# Patient Record
Sex: Female | Born: 1970 | Race: White | Hispanic: No | Marital: Married | Smoking: Never smoker
Health system: Southern US, Community
[De-identification: ages and names within clinical notes are randomized; demographics above are authoritative.]

## PROBLEM LIST (undated history)

## (undated) DIAGNOSIS — R112 Nausea with vomiting, unspecified: Secondary | ICD-10-CM

## (undated) DIAGNOSIS — E119 Type 2 diabetes mellitus without complications: Secondary | ICD-10-CM

## (undated) DIAGNOSIS — D500 Iron deficiency anemia secondary to blood loss (chronic): Secondary | ICD-10-CM

## (undated) DIAGNOSIS — D72829 Elevated white blood cell count, unspecified: Secondary | ICD-10-CM

## (undated) DIAGNOSIS — E282 Polycystic ovarian syndrome: Secondary | ICD-10-CM

## (undated) DIAGNOSIS — Z8619 Personal history of other infectious and parasitic diseases: Secondary | ICD-10-CM

## (undated) DIAGNOSIS — Z9889 Other specified postprocedural states: Secondary | ICD-10-CM

## (undated) DIAGNOSIS — K219 Gastro-esophageal reflux disease without esophagitis: Secondary | ICD-10-CM

## (undated) DIAGNOSIS — I10 Essential (primary) hypertension: Secondary | ICD-10-CM

## (undated) DIAGNOSIS — R51 Headache: Secondary | ICD-10-CM

## (undated) DIAGNOSIS — K635 Polyp of colon: Secondary | ICD-10-CM

## (undated) DIAGNOSIS — E782 Mixed hyperlipidemia: Secondary | ICD-10-CM

## (undated) DIAGNOSIS — D72828 Other elevated white blood cell count: Secondary | ICD-10-CM

## (undated) DIAGNOSIS — M542 Cervicalgia: Secondary | ICD-10-CM

## (undated) DIAGNOSIS — E039 Hypothyroidism, unspecified: Secondary | ICD-10-CM

## (undated) DIAGNOSIS — D122 Benign neoplasm of ascending colon: Secondary | ICD-10-CM

## (undated) DIAGNOSIS — Z1231 Encounter for screening mammogram for malignant neoplasm of breast: Secondary | ICD-10-CM

## (undated) DIAGNOSIS — K529 Noninfective gastroenteritis and colitis, unspecified: Secondary | ICD-10-CM

## (undated) DIAGNOSIS — E1165 Type 2 diabetes mellitus with hyperglycemia: Principal | ICD-10-CM

## (undated) DIAGNOSIS — R221 Localized swelling, mass and lump, neck: Secondary | ICD-10-CM

## (undated) DIAGNOSIS — G5601 Carpal tunnel syndrome, right upper limb: Secondary | ICD-10-CM

## (undated) DIAGNOSIS — T3695XA Adverse effect of unspecified systemic antibiotic, initial encounter: Secondary | ICD-10-CM

## (undated) DIAGNOSIS — R928 Other abnormal and inconclusive findings on diagnostic imaging of breast: Secondary | ICD-10-CM

## (undated) DIAGNOSIS — N63 Unspecified lump in unspecified breast: Secondary | ICD-10-CM

## (undated) DIAGNOSIS — G5603 Carpal tunnel syndrome, bilateral upper limbs: Secondary | ICD-10-CM

## (undated) DIAGNOSIS — N6311 Unspecified lump in the right breast, upper outer quadrant: Secondary | ICD-10-CM

## (undated) DIAGNOSIS — B379 Candidiasis, unspecified: Principal | ICD-10-CM

## (undated) DIAGNOSIS — R0683 Snoring: Secondary | ICD-10-CM

## (undated) DIAGNOSIS — N6331 Unspecified lump in axillary tail of the right breast: Secondary | ICD-10-CM

## (undated) DIAGNOSIS — R2231 Localized swelling, mass and lump, right upper limb: Secondary | ICD-10-CM

## (undated) DIAGNOSIS — M25512 Pain in left shoulder: Secondary | ICD-10-CM

## (undated) DIAGNOSIS — L988 Other specified disorders of the skin and subcutaneous tissue: Secondary | ICD-10-CM

## (undated) HISTORY — PX: BREAST LUMPECTOMY: SHX2

## (undated) HISTORY — DX: Polycystic ovarian syndrome: E28.2

## (undated) HISTORY — PX: TONSILLECTOMY AND ADENOIDECTOMY: SUR1326

## (undated) HISTORY — DX: Type 2 diabetes mellitus without complications: E11.9

## (undated) HISTORY — PX: HERNIA REPAIR: SHX51

## (undated) HISTORY — DX: Polyp of colon: K63.5

## (undated) HISTORY — DX: Essential (primary) hypertension: I10

## (undated) HISTORY — PX: WISDOM TOOTH EXTRACTION: SHX21

## (undated) HISTORY — PX: FINGER SURGERY: SHX640

## (undated) HISTORY — DX: Personal history of other infectious and parasitic diseases: Z86.19

## (undated) HISTORY — PX: DILATION AND CURETTAGE OF UTERUS: SHX78

## (undated) HISTORY — DX: Elevated white blood cell count, unspecified: D72.829

---

## 2007-12-04 HISTORY — PX: EXTERNAL CEPHALIC VERSION: SUR1454

## 2011-04-25 HISTORY — PX: FOOT SURGERY: SHX648

## 2012-04-24 DIAGNOSIS — K635 Polyp of colon: Secondary | ICD-10-CM

## 2012-04-24 HISTORY — DX: Polyp of colon: K63.5

## 2013-02-03 ENCOUNTER — Ambulatory Visit (INDEPENDENT_AMBULATORY_CARE_PROVIDER_SITE_OTHER): Payer: 59 | Admitting: Gynecology

## 2013-02-03 ENCOUNTER — Encounter: Payer: Self-pay | Admitting: Gynecology

## 2013-02-03 VITALS — BP 134/88 | HR 90 | Resp 18 | Ht 64.0 in | Wt 220.0 lb

## 2013-02-03 DIAGNOSIS — R102 Pelvic and perineal pain unspecified side: Secondary | ICD-10-CM

## 2013-02-03 DIAGNOSIS — Z0000 Encounter for general adult medical examination without abnormal findings: Secondary | ICD-10-CM

## 2013-02-03 DIAGNOSIS — N949 Unspecified condition associated with female genital organs and menstrual cycle: Secondary | ICD-10-CM

## 2013-02-03 DIAGNOSIS — N926 Irregular menstruation, unspecified: Secondary | ICD-10-CM

## 2013-02-03 DIAGNOSIS — N760 Acute vaginitis: Secondary | ICD-10-CM

## 2013-02-03 DIAGNOSIS — Z800 Family history of malignant neoplasm of digestive organs: Secondary | ICD-10-CM

## 2013-02-03 DIAGNOSIS — E282 Polycystic ovarian syndrome: Secondary | ICD-10-CM | POA: Insufficient documentation

## 2013-02-03 DIAGNOSIS — Z01419 Encounter for gynecological examination (general) (routine) without abnormal findings: Secondary | ICD-10-CM

## 2013-02-03 DIAGNOSIS — Z8739 Personal history of other diseases of the musculoskeletal system and connective tissue: Secondary | ICD-10-CM

## 2013-02-03 LAB — POCT URINALYSIS DIPSTICK
Urobilinogen, UA: NEGATIVE
pH, UA: 5

## 2013-02-03 MED ORDER — FLUCONAZOLE 150 MG PO TABS: 150.0000 mg | ORAL_TABLET | Freq: Once | ORAL | Status: DC

## 2013-02-03 NOTE — Progress Notes (Addendum)
42 y.o. Married Caucasian female   [phone] here for annual exam. Pt is currently sexually active. Pt reports lifefong heavy menses, she will use super+ tampon with pad every 1.5h with clotting, but most recently she will have light spotting before she flows.  Flows heavy 3-4d, some cramping.  Pt had never been regular, and was diagnosed with PCOS  6y ago by history. Pt can skip 1-76m.   Pt reports right lower quadrant pain intermittent for 1y  Was seen in  ER in [place] had CT and PUS both were normal.  Evaluation included GI, normal.  Pt was borderline gestational diabetic and mild pre-eclamptic.  Pt had been started on metformin 6y ago but took very sporadically, has moved frequently so no consistent care.    Patient's last menstrual period was 01/09/2013.          Sexually active: yes  The current method of family planning is the withdrawal method.    Exercising: no  The patient does not participate in regular exercise at present. Last pap: 1 year and 6 months; Normal Alcohol: no Tobacco: no BSE:  Sometimes Mammogram: years ago  Colonoscopy: 3y ago  Hgb: PCP ; Urine: Leuks 2  No health maintenance topics applied.  Family History  Problem Relation Age of Onset  . Colon cancer Mother   . Diabetes Mother   . Lymphoma Paternal Grandmother   . Breast cancer Cousin     Patient Active Problem List   Diagnosis Date Noted  . PCOS (polycystic ovarian syndrome)     Past Medical History  Diagnosis Date  . PCOS (polycystic ovarian syndrome)   . Endometriosis   . Hypertension     Past Surgical History  Procedure Laterality Date  . Dilation and curettage of uterus      x2   . Breast lumpectomy Right 20 years ago    Benign Enlarged Lymph Node  . Tonsillectomy and adenoidectomy  32 year ago  . Finger surgery  25 years ago    Allergies: Review of patient's allergies indicates no known allergies.  Current Outpatient Prescriptions  Medication Sig Dispense Refill  . losartan (COZAAR) 50  MG tablet       . metFORMIN (GLUCOPHAGE) 500 MG tablet Take 500 mg by mouth 2 (two) times daily with a meal.       No current facility-administered medications for this visit.    ROS: Pertinent items are noted in HPI.  Exam:    BP 134/88  Pulse 90  Resp 18  Ht 5\' 4"  (1.626 m)  Wt 220 lb (99.791 kg)  BMI 37.74 kg/m2  LMP 01/09/2013 Weight change: @WEIGHTCHANGE @ Last 3 height recordings:  Ht Readings from Last 3 Encounters:  02/03/13 5\' 4"  (1.626 m)   General appearance: alert, cooperative and appears stated age Head: Normocephalic, without obvious abnormality, atraumatic Neck: no adenopathy, no carotid bruit, no JVD, supple, symmetrical, trachea midline and thyroid not enlarged, symmetric, no tenderness/mass/nodules Lungs: clear to auscultation bilaterally Breasts: normal appearance, no masses or tenderness Heart: regular rate and rhythm, S1, S2 normal, no murmur, click, rub or gallop Abdomen: soft, non-tender; bowel sounds normal; no masses,  no organomegaly Extremities: extremities normal, atraumatic, no cyanosis or edema Skin: Skin color, texture, turgor normal. No rashes or lesions Lymph nodes: Cervical, supraclavicular, and axillary nodes normal. no inguinal nodes palpated Neurologic: Grossly normal Spine:  Pt examine upright, curvature noted on left thoracic and right lower back   Pelvic: External genitalia:  no lesions  Urethra: normal appearing urethra with no masses, tenderness or lesions              Bartholins and Skenes: normal                 Vagina: normal appearing vagina with normal color and discharge, no lesions, vaginal discharge - white, curd-like, green and thick              Cervix: normal appearance              Pap taken: yes        Bimanual Exam:  Uterus:  enlarged to 8 week's size, no distinct fibroid                                      Adnexa:    no masses                                      Rectovaginal: tenderness over right levator  muscles, no uterosacral nodularity                                      Anus:  normal sphincter tone, no lesions  A: well woman Contraceptive management Enlarged uterus Irregular menses Pelvic pain Family history of colon cancer     P: mammogram annually recommended pap smear with HRHPV, guidelines reviewed Will get records from hospital in [place] regarding u/s, ct-pt to call before f/u to assure arrival Suspect PCOS-discussed importance of taking metformin, discussed impact of elevated LH on CVD and atherosclerosis.  Pt with macrosomia and central obesity in addition, irregular menses, risks of uterine cancer with unopposed estrogen discussed.  Suggest she consider porgestin IUD to protect against uterine cancer and may achieve ovulation with compliance with metformin. Pt agrees to return for fasting labs on day 3-FSH/LH, GLUC, INSULIN.  Will rto in 4w Pain related to spinal abnormalities? Refer to ortho, may need PT Refer for colon cancer screening Gross yeast-fluconazole return annually or prn   An After Visit Summary was printed and given to the patient.   Records reviewed:   08/06/12:   CT:  No acute pathology in abdomen or pelvis, hepatic fatty liver infiltration. Right renal cyst-small hypodense lesions, no measurements given U/S:  Left ovary not viz, right normal, normal uterus  9.8x7.4x5.8cm, ems 16mm T:  29, freeT: 1.1, DHEAS: 180.7, TSH 2.8, random glucose 109 PAP: 2/13 NEG HRHPV, NL

## 2013-02-03 NOTE — Patient Instructions (Addendum)
Return for fasting labs around day 3 of upcoming cycle, first day of flow=1 Call 1w before f/u to check on records F/u with GI and ortho EXERCISE AND DIET:  We recommended that you start or continue a regular exercise program for good health. Regular exercise means any activity that makes your heart beat faster and makes you sweat.  We recommend exercising at least 30 minutes per day at least 3 days a week, preferably 4 or 5.  We also recommend a diet low in fat and sugar.  Inactivity, poor dietary choices and obesity can cause diabetes, heart attack, stroke, and kidney damage, among others.    ALCOHOL AND SMOKING:  Women should limit their alcohol intake to no more than 7 drinks/beers/glasses of wine (combined, not each!) per week. Moderation of alcohol intake to this level decreases your risk of breast cancer and liver damage. And of course, no recreational drugs are part of a healthy lifestyle.  And absolutely no smoking or even second hand smoke. Most people know smoking can cause heart and lung diseases, but did you know it also contributes to weakening of your bones? Aging of your skin?  Yellowing of your teeth and nails?  CALCIUM AND VITAMIN D:  Adequate intake of calcium and Vitamin D are recommended.  The recommendations for exact amounts of these supplements seem to change often, but generally speaking 600 mg of calcium (either carbonate or citrate) and 800 units of Vitamin D per day seems prudent. Certain women may benefit from higher intake of Vitamin D.  If you are among these women, your doctor will have told you during your visit.    PAP SMEARS:  Pap smears, to check for cervical cancer or precancers,  have traditionally been done yearly, although recent scientific advances have shown that most women can have pap smears less often.  However, every woman still should have a physical exam from her gynecologist every year. It will include a breast check, inspection of the vulva and vagina to  check for abnormal growths or skin changes, a visual exam of the cervix, and then an exam to evaluate the size and shape of the uterus and ovaries.  And after 42 years of age, a rectal exam is indicated to check for rectal cancers. We will also provide age appropriate advice regarding health maintenance, like when you should have certain vaccines, screening for sexually transmitted diseases, bone density testing, colonoscopy, mammograms, etc.   MAMMOGRAMS:  All women over 42 years old should have a yearly mammogram. Many facilities now offer a "3D" mammogram, which may cost around $50 extra out of pocket. If possible,  we recommend you accept the option to have the 3D mammogram performed.  It both reduces the number of women who will be called back for extra views which then turn out to be normal, and it is better than the routine mammogram at detecting truly abnormal areas.    COLONOSCOPY:  Colonoscopy to screen for colon cancer is recommended for all women at age 25.  We know, you hate the idea of the prep.  We agree, BUT, having colon cancer and not knowing it is worse!!  Colon cancer so often starts as a polyp that can be seen and removed at colonscopy, which can quite literally save your life!  And if your first colonoscopy is normal and you have no family history of colon cancer, most women don't have to have it again for 10 years.  Once every ten years,  you can do something that may end up saving your life, right?  We will be happy to help you get it scheduled when you are ready.  Be sure to check your insurance coverage so you understand how much it will cost.  It may be covered as a preventative service at no cost, but you should check your particular policy.

## 2013-02-04 LAB — HEMOGLOBIN, FINGERSTICK: Hemoglobin, fingerstick: 12.2 g/dL (ref 12.0–16.0)

## 2013-02-13 ENCOUNTER — Telehealth: Payer: Self-pay | Admitting: Gynecology

## 2013-02-13 NOTE — Telephone Encounter (Signed)
Pt states she needs to schedule an appointment for insulin check

## 2013-02-13 NOTE — Telephone Encounter (Signed)
Patient calling to schedule labwork.  Menstrual flow started yesterday.  Fasting lab appointment  tomm at 8:30 am.  Routed to provider for review, patient agreeable with disposition.  Encounter closed.

## 2013-02-14 ENCOUNTER — Other Ambulatory Visit (INDEPENDENT_AMBULATORY_CARE_PROVIDER_SITE_OTHER): Payer: 59

## 2013-02-14 DIAGNOSIS — N926 Irregular menstruation, unspecified: Secondary | ICD-10-CM

## 2013-02-15 LAB — GLUCOSE, RANDOM: Glucose, Bld: 111 mg/dL — ABNORMAL HIGH (ref 70–99)

## 2013-02-15 LAB — INSULIN, FASTING: Insulin fasting, serum: 49 u[IU]/mL — ABNORMAL HIGH (ref 3–28)

## 2013-02-15 LAB — FSH/LH
FSH: 3.9 m[IU]/mL
LH: 4.1 m[IU]/mL

## 2013-02-27 ENCOUNTER — Other Ambulatory Visit: Payer: Self-pay

## 2013-03-14 ENCOUNTER — Encounter: Payer: Self-pay | Admitting: Gynecology

## 2013-03-14 ENCOUNTER — Ambulatory Visit (INDEPENDENT_AMBULATORY_CARE_PROVIDER_SITE_OTHER): Payer: 59 | Admitting: Gynecology

## 2013-03-14 VITALS — BP 130/78 | HR 74 | Resp 16 | Ht 64.0 in | Wt 219.0 lb

## 2013-03-14 DIAGNOSIS — N920 Excessive and frequent menstruation with regular cycle: Secondary | ICD-10-CM

## 2013-03-14 DIAGNOSIS — Z309 Encounter for contraceptive management, unspecified: Secondary | ICD-10-CM

## 2013-03-14 DIAGNOSIS — I10 Essential (primary) hypertension: Secondary | ICD-10-CM | POA: Insufficient documentation

## 2013-03-14 DIAGNOSIS — Z3009 Encounter for other general counseling and advice on contraception: Secondary | ICD-10-CM

## 2013-03-14 DIAGNOSIS — E119 Type 2 diabetes mellitus without complications: Secondary | ICD-10-CM | POA: Insufficient documentation

## 2013-03-14 DIAGNOSIS — IMO0001 Reserved for inherently not codable concepts without codable children: Secondary | ICD-10-CM

## 2013-03-14 DIAGNOSIS — R7309 Other abnormal glucose: Secondary | ICD-10-CM

## 2013-03-14 DIAGNOSIS — R7303 Prediabetes: Secondary | ICD-10-CM

## 2013-03-14 MED ORDER — METFORMIN HCL 500 MG PO TABS: 500.0000 mg | ORAL_TABLET | Freq: Every day | ORAL | Status: DC

## 2013-03-14 MED ORDER — LEVONORGESTREL 20 MCG/24HR IU IUD: 1.0000 | INTRAUTERINE_SYSTEM | Freq: Once | INTRAUTERINE | Status: DC

## 2013-03-14 MED ORDER — METFORMIN HCL 500 MG PO TABS: 500.0000 mg | ORAL_TABLET | Freq: Two times a day (BID) | ORAL | Status: DC

## 2013-03-14 NOTE — Patient Instructions (Signed)
Condoms for contraception until IUD inserted Avoid simple sugars on metformin

## 2013-03-14 NOTE — Progress Notes (Signed)
Pt here for consultation of her questionable diagnosis of PCOS, right lower quardant pain and menorrhagia. We reviewed her records form the ER in [place]- Her U/S:  Left ovary not viz, right normal, normal uterus  9.8x7.4x5.8cm, ems 16mm T:  29, freeT: 1.1, DHEAS: 180.7, TSH 2.8, random glucose 109 Pt reports having her cycle shortly after that u/s. Pt reports that her recent cycle was very light and long but that she is on her cycle now and that is the typical heavy with clots.  Pt is using withdrawal only for contraception and has not gotten pregnant.  She had tried ocp in the past but did not like how she felt on them. She had day 3 labs here and now presents for consultation.  We discussed her prior labs-normal TSH, testosterone and free and random glucose. Our labs showed an elevated fasting insulin with an elevated glucose of 111.  Pt was informed that 115 is diagnostic for diabetes, and that her elevated insulin similarly.  I stressed that she should resume her metformin, we discussed the best dietary changes that would make it more tolerable from the side effect aspect.  We discussed slowly increasing her dose to decrease side effect profile.  Questions were addressed. We also discussed her normal FSH/LH, her ovaries in addition on her u/s were not suggestive of PCOS.  Her testosterone was also normal. Her uterus is mildly enlarged with no mention of fibroids and I suggest she may have adenomyosis and not endometriosis.  We discussed the differences between the 2.  We discussed how adenomyosis is related to menorrhagia and the clots that she reports.  Treatment options of progestin IUD, oral contraception, endometrial ablation and hysterectomy were discussed in detail.  Pros and cons of each.  Questions were invited and answered.  She would like to try the Mirena IUD at this time.  She is currently on her cycle and if possible we can try to place this month, we informed her to use condoms or avoid  sex until placement.  Expected bleeding profile was reviewed. She is due for an annual in 2/15 and we will follow up these issues at that time.  Length of time spent discussing glucose intolerance, menorrhagia and contraception >37m, >50% face to face

## 2013-03-28 ENCOUNTER — Other Ambulatory Visit: Payer: Self-pay | Admitting: Gynecology

## 2013-03-28 DIAGNOSIS — Z30430 Encounter for insertion of intrauterine contraceptive device: Secondary | ICD-10-CM

## 2013-03-28 DIAGNOSIS — R7303 Prediabetes: Secondary | ICD-10-CM

## 2013-03-28 NOTE — Telephone Encounter (Signed)
PC from Trish at [facility] regarding order for Mirena IUD.  Advised this order was placed in error and to disregard.  Refill reques from local pharmacy for Metformin qnty 15.  This is denied as RX for 60 tabs was sent to pharmacy on the same day.  PC to patient to advise that Metformin RX was denied since she should have RX at pharmacy for 60 tab refill.    Also advised that Mirena is being precerted and hopefully she will receive a call next week from our office with out of pocket and scheduling information.

## 2013-03-31 NOTE — Telephone Encounter (Signed)
Please see how pt feels on current metformin dose, it is low and if she is tolerating, would like to increase to BID

## 2013-03-31 NOTE — Telephone Encounter (Signed)
According to rx sent 03/14/13 patient is already taking Metformin BID

## 2013-03-31 NOTE — Telephone Encounter (Signed)
S/w patient she says this dosage is not doing anything for her body wise. Has Colonoscopy tomorrow won't be able to take rx for two days then will take Metformin BID. Needs new rx she needs 3 months at a time.

## 2013-04-01 ENCOUNTER — Telehealth: Payer: Self-pay | Admitting: Gynecology

## 2013-04-01 MED ORDER — METFORMIN HCL 500 MG PO TABS: 500.0000 mg | ORAL_TABLET | Freq: Two times a day (BID) | ORAL | Status: DC

## 2013-04-01 NOTE — Telephone Encounter (Signed)
LMTCB to discuss ins benefits for IUD insertion and the process for scheduling.

## 2013-04-01 NOTE — Addendum Note (Signed)
Addended by: Lorraine Lax on: 04/01/2013 08:55 AM   Modules accepted: Orders

## 2013-04-01 NOTE — Telephone Encounter (Addendum)
Per Dr. Farrel Gobble okay to send in Metformin 500 mg #180/0 refills, patient is aware.

## 2013-04-02 NOTE — Telephone Encounter (Signed)
Patient returning Carolynn's call. °

## 2013-04-03 NOTE — Telephone Encounter (Signed)
LMTCB

## 2013-04-07 NOTE — Telephone Encounter (Signed)
LMTCB for benefit and scheduling info

## 2013-04-09 ENCOUNTER — Telehealth: Payer: Self-pay | Admitting: Gynecology

## 2013-04-09 NOTE — Telephone Encounter (Signed)
Patient is calling to let us know she started her cycle. Need to set up appt to set up appt for IUD insertion.

## 2013-04-10 NOTE — Telephone Encounter (Signed)
Spoke with patient. Started cycle 12/16. IUD appointment scheduled.  Motrin instructions given.   Motrin=Advil=Ibuprofen  800 mg one hour before procedure. Eat a meal and hydrate well before appointment.  Patient verbalized understanding. Already precerted.  Routing to provider for final review. Patient agreeable to disposition. Will close encounter

## 2013-04-11 ENCOUNTER — Ambulatory Visit (INDEPENDENT_AMBULATORY_CARE_PROVIDER_SITE_OTHER): Payer: 59 | Admitting: Gynecology

## 2013-04-11 ENCOUNTER — Encounter: Payer: Self-pay | Admitting: Gynecology

## 2013-04-11 VITALS — BP 120/72 | HR 78 | Resp 18 | Ht 64.0 in | Wt 219.0 lb

## 2013-04-11 DIAGNOSIS — Z30430 Encounter for insertion of intrauterine contraceptive device: Secondary | ICD-10-CM

## 2013-04-11 DIAGNOSIS — Z975 Presence of (intrauterine) contraceptive device: Secondary | ICD-10-CM

## 2013-04-11 DIAGNOSIS — N920 Excessive and frequent menstruation with regular cycle: Secondary | ICD-10-CM

## 2013-04-11 NOTE — Progress Notes (Signed)
42 yrsMarriedCaucasianfemale presents for  insertion of Mirena. Denies any vaginal symptoms or STD concerns.   LMP: 04/10/13 Patient read information regarding IUD insertion.  All questions addressed.    Healthy female,time, place and personnormal menses, no abnormal bleeding, pelvic pain or discharge Abdomen: soft, non-tender Groinno inguinal nodes palpated  Pelvic exam: Vulva;normal  Vagina:normal vagina  Cervix:Non-tender, Negative CMT, no lesions or redness, nulliparous/parous os  Uterus:normal shape, position and consistency, enlarged size, 8w     Procedure:  Bimanual exam done. Speculum inserted into vagina. Cervix visualized and cleansed with betadine solution X 3 xylocaine jelly placed in endocervix and anterior lip. Tenaculum placed on cervix at 12 o'clock position(s).  Uterus sounded to 9 centimeters.  IUD removed from sterile packet and under sterile conditions inserted to fundus of uterus.  Introducer removed without difficulty.  IUD string trimmed to 2 centimeters.  Remainder string given to patient to feel for identification.  Tenaculum removed.  No bleeding noted.  Speculum removed.  Uterus palpated normal.  Patient tolerated procedure well.  A: Insertion of Mirena, Lot # [id number], Expiration date 8/16   P:  Instructions and warnings signs given.       IUD identification card given with IUD removal 03/2018       Return visit 63m

## 2013-05-10 ENCOUNTER — Emergency Department (HOSPITAL_COMMUNITY): Payer: 59

## 2013-05-10 ENCOUNTER — Emergency Department (HOSPITAL_COMMUNITY)
Admission: EM | Admit: 2013-05-10 | Discharge: 2013-05-10 | Disposition: A | Discharge status: Home / Self Care | Payer: 59 | Attending: Emergency Medicine | Admitting: Emergency Medicine

## 2013-05-10 ENCOUNTER — Encounter (HOSPITAL_COMMUNITY): Payer: Self-pay | Admitting: Emergency Medicine

## 2013-05-10 DIAGNOSIS — I10 Essential (primary) hypertension: Secondary | ICD-10-CM | POA: Insufficient documentation

## 2013-05-10 DIAGNOSIS — Z79899 Other long term (current) drug therapy: Secondary | ICD-10-CM | POA: Insufficient documentation

## 2013-05-10 DIAGNOSIS — Z860100 Personal history of colon polyps, unspecified: Secondary | ICD-10-CM | POA: Insufficient documentation

## 2013-05-10 DIAGNOSIS — M545 Low back pain, unspecified: Secondary | ICD-10-CM | POA: Insufficient documentation

## 2013-05-10 DIAGNOSIS — R1031 Right lower quadrant pain: Secondary | ICD-10-CM | POA: Insufficient documentation

## 2013-05-10 DIAGNOSIS — M549 Dorsalgia, unspecified: Secondary | ICD-10-CM

## 2013-05-10 DIAGNOSIS — Z8742 Personal history of other diseases of the female genital tract: Secondary | ICD-10-CM | POA: Insufficient documentation

## 2013-05-10 DIAGNOSIS — N898 Other specified noninflammatory disorders of vagina: Secondary | ICD-10-CM | POA: Insufficient documentation

## 2013-05-10 DIAGNOSIS — Z3202 Encounter for pregnancy test, result negative: Secondary | ICD-10-CM | POA: Insufficient documentation

## 2013-05-10 DIAGNOSIS — Z791 Long term (current) use of non-steroidal anti-inflammatories (NSAID): Secondary | ICD-10-CM | POA: Insufficient documentation

## 2013-05-10 LAB — URINALYSIS, ROUTINE W REFLEX MICROSCOPIC
Bilirubin Urine: NEGATIVE
Glucose, UA: NEGATIVE mg/dL
Ketones, ur: NEGATIVE mg/dL
LEUKOCYTES UA: NEGATIVE
NITRITE: NEGATIVE
PROTEIN: NEGATIVE mg/dL
SPECIFIC GRAVITY, URINE: 1.026 (ref 1.005–1.030)
Urobilinogen, UA: 0.2 mg/dL (ref 0.0–1.0)
pH: 7 (ref 5.0–8.0)

## 2013-05-10 LAB — URINE MICROSCOPIC-ADD ON

## 2013-05-10 LAB — POCT I-STAT, CHEM 8
BUN: 14 mg/dL (ref 6–23)
CALCIUM ION: 1.21 mmol/L (ref 1.12–1.23)
Chloride: 102 mEq/L (ref 96–112)
Creatinine, Ser: 0.9 mg/dL (ref 0.50–1.10)
Glucose, Bld: 93 mg/dL (ref 70–99)
HEMATOCRIT: 39 % (ref 36.0–46.0)
HEMOGLOBIN: 13.3 g/dL (ref 12.0–15.0)
Potassium: 3.3 mEq/L — ABNORMAL LOW (ref 3.7–5.3)
Sodium: 142 mEq/L (ref 137–147)
TCO2: 26 mmol/L (ref 0–100)

## 2013-05-10 LAB — POCT PREGNANCY, URINE: PREG TEST UR: NEGATIVE

## 2013-05-10 LAB — WET PREP, GENITAL
Clue Cells Wet Prep HPF POC: NONE SEEN
Trich, Wet Prep: NONE SEEN
Yeast Wet Prep HPF POC: NONE SEEN

## 2013-05-10 MED ORDER — HYDROCODONE-ACETAMINOPHEN 5-325 MG PO TABS
2.0000 | ORAL_TABLET | ORAL | Status: DC | PRN
Start: 2013-05-10 — End: 2013-05-21

## 2013-05-10 MED ORDER — IBUPROFEN 800 MG PO TABS
800.0000 mg | ORAL_TABLET | Freq: Once | ORAL | Status: AC
  Administered 2013-05-10: 800 mg via ORAL
  Filled 2013-05-10: qty 1

## 2013-05-10 MED ORDER — NAPROXEN 375 MG PO TABS: 375.0000 mg | ORAL_TABLET | Freq: Two times a day (BID) | ORAL | Status: DC

## 2013-05-10 NOTE — ED Provider Notes (Signed)
CSN: [id number]     Arrival date & time 05/10/13  1541 History   First MD Initiated Contact with Patient 05/10/13 1718     Chief Complaint  Patient presents with  . Flank Pain  . Back Pain   (Consider location/radiation/quality/duration/timing/severity/associated sxs/prior Treatment) HPI Comments: Patient presents with a two-day history of right flank pain. She states it started yesterday and her lower back and radiates around to her lower abdomen. She denies any urinary symptoms. She denies any vaginal discharge. She has a little bit of spotting and lower abdominal cramping that she says is from her period coming on. She denies any nausea vomiting or fevers. She has a history of the splints in the past and had a pelvic ultrasound at an outside facility which she states was normal. She does have a history of polycystic ovarian syndrome. She's not taking anything for the pain today. She states the pain is worse with movement and lifting up her leg. She denies any radiation of the pain down her leg. She denies any numbness or weakness in the leg.  Patient is a 43 y.o. female presenting with flank pain and back pain.  Flank Pain Associated symptoms include abdominal pain. Pertinent negatives include no chest pain, no headaches and no shortness of breath.  Back Pain Associated symptoms: abdominal pain   Associated symptoms: no chest pain, no fever, no headaches, no numbness and no weakness     Past Medical History  Diagnosis Date  . PCOS (polycystic ovarian syndrome)   . Hypertension   . Colon polyps 2014    hyperplastic,    Past Surgical History  Procedure Laterality Date  . Dilation and curettage of uterus      x2   . Breast lumpectomy Right 20 years ago    Benign Enlarged Lymph Node  . Tonsillectomy and adenoidectomy  32 year ago  . Finger surgery  25 years ago   Family History  Problem Relation Age of Onset  . Colon cancer Mother 5    stage 1  . Diabetes Mother   . Lymphoma  Paternal Grandmother   . Breast cancer Cousin    History  Substance Use Topics  . Smoking status: Never Smoker   . Smokeless tobacco: Never Used  . Alcohol Use: No   OB History   Grav Para Term Preterm Abortions TAB SAB Ect Mult Living   5 3 3  2  2   3      Review of Systems  Constitutional: Negative for fever, chills, diaphoresis and fatigue.  HENT: Negative for congestion, rhinorrhea and sneezing.   Eyes: Negative.   Respiratory: Negative for cough, chest tightness and shortness of breath.   Cardiovascular: Negative for chest pain and leg swelling.  Gastrointestinal: Positive for abdominal pain. Negative for nausea, vomiting, diarrhea and blood in stool.  Genitourinary: Positive for flank pain and vaginal bleeding. Negative for frequency, hematuria, vaginal discharge, difficulty urinating and vaginal pain.  Musculoskeletal: Positive for back pain. Negative for arthralgias.  Skin: Negative for rash.  Neurological: Negative for dizziness, speech difficulty, weakness, numbness and headaches.    Allergies  Review of patient's allergies indicates no known allergies.  Home Medications   Current Outpatient Rx  Name  Route  Sig  Dispense  Refill  . levonorgestrel (MIRENA) 20 MCG/24HR IUD   Intrauterine   1 Intra Uterine Device (1 each total) by Intrauterine route once.   1 each   0   . metFORMIN (GLUCOPHAGE) 500 MG  tablet   Oral   Take 1 tablet (500 mg total) by mouth 2 (two) times daily with a meal. Start after completing first presciption   180 tablet   0   . HYDROcodone-acetaminophen (NORCO/VICODIN) 5-325 MG per tablet   Oral   Take 2 tablets by mouth every 4 (four) hours as needed.   15 tablet   0   . naproxen (NAPROSYN) 375 MG tablet   Oral   Take 1 tablet (375 mg total) by mouth 2 (two) times daily.   20 tablet   0    BP 152/74  Pulse 86  Temp(Src) 98.5 F (36.9 C) (Oral)  Resp 17  Ht 5\' 4"  (1.626 m)  Wt 223 lb 1 oz (101.18 kg)  BMI 38.27 kg/m2  SpO2  100%  LMP 04/08/2013 Physical Exam  Constitutional: She is oriented to person, place, and time. She appears well-developed and well-nourished.  HENT:  Head: Normocephalic and atraumatic.  Eyes: Pupils are equal, round, and reactive to light.  Neck: Normal range of motion. Neck supple.  Cardiovascular: Normal rate, regular rhythm and normal heart sounds.   Pulmonary/Chest: Effort normal and breath sounds normal. No respiratory distress. She has no wheezes. She has no rales. She exhibits no tenderness.  Abdominal: Soft. Bowel sounds are normal. There is tenderness (mild tenderness of the right lower abdomen. No pain over McBurney's point). There is no rebound and no guarding.  Positive tenderness to the right lower back.  Genitourinary: No vaginal discharge found.  No cervical motion tenderness. No adnexal tenderness. There is a small amount of dark blood but no ongoing bleeding.  Musculoskeletal: Normal range of motion. She exhibits no edema.  Lymphadenopathy:    She has no cervical adenopathy.  Neurological: She is alert and oriented to person, place, and time.  Skin: Skin is warm and dry. No rash noted.  Psychiatric: She has a normal mood and affect.    ED Course  Procedures (including critical care time) Labs Review Results for orders placed during the hospital encounter of 05/10/13  WET PREP, GENITAL      Result Value Range   Yeast Wet Prep HPF POC NONE SEEN  NONE SEEN   Trich, Wet Prep NONE SEEN  NONE SEEN   Clue Cells Wet Prep HPF POC NONE SEEN  NONE SEEN   WBC, Wet Prep HPF POC FEW (*) NONE SEEN  URINALYSIS, ROUTINE W REFLEX MICROSCOPIC      Result Value Range   Color, Urine YELLOW  YELLOW   APPearance CLEAR  CLEAR   Specific Gravity, Urine 1.026  1.005 - 1.030   pH 7.0  5.0 - 8.0   Glucose, UA NEGATIVE  NEGATIVE mg/dL   Hgb urine dipstick MODERATE (*) NEGATIVE   Bilirubin Urine NEGATIVE  NEGATIVE   Ketones, ur NEGATIVE  NEGATIVE mg/dL   Protein, ur NEGATIVE  NEGATIVE  mg/dL   Urobilinogen, UA 0.2  0.0 - 1.0 mg/dL   Nitrite NEGATIVE  NEGATIVE   Leukocytes, UA NEGATIVE  NEGATIVE  URINE MICROSCOPIC-ADD ON      Result Value Range   Squamous Epithelial / LPF FEW (*) RARE   WBC, UA 0-2  <3 WBC/hpf   RBC / HPF 3-6  <3 RBC/hpf   Bacteria, UA FEW (*) RARE  POCT PREGNANCY, URINE      Result Value Range   Preg Test, Ur NEGATIVE  NEGATIVE  POCT I-STAT, CHEM 8      Result Value Range   Sodium  142  137 - 147 mEq/L   Potassium 3.3 (*) 3.7 - 5.3 mEq/L   Chloride 102  96 - 112 mEq/L   BUN 14  6 - 23 mg/dL   Creatinine, Ser 0.90  0.50 - 1.10 mg/dL   Glucose, Bld 93  70 - 99 mg/dL   Calcium, Ion 1.21  1.12 - 1.23 mmol/L   TCO2 26  0 - 100 mmol/L   Hemoglobin 13.3  12.0 - 15.0 g/dL   HCT 39.0  36.0 - 46.0 %   No results found.   Imaging Review Ct Abdomen Pelvis Wo Contrast  05/10/2013   CLINICAL DATA:  FLANK PAIN BACK PAIN, right flank pain  EXAM: CT ABDOMEN AND PELVIS WITHOUT CONTRAST  TECHNIQUE: Multidetector CT imaging of the abdomen and pelvis was performed following the standard protocol without intravenous contrast.  COMPARISON:  None.  FINDINGS: Lung bases are clear.  No pericardial fluid.  Non IV contrast images demonstrate no focal hepatic lesion. Gallbladder, pancreas, spleen, adrenal glands, and kidneys are normal. There is no nephrolithiasis or ureterolithiasis.  Stomach, small bowel, and cecum are normal. Appendix is normal. The colon rectosigmoid colon are normal.  Abdominal aortic are normal caliber. No retroperitoneal periportal lymphadenopathy.  No free fluid the pelvis. No dysuria stones or bladder stone. IUD in expected location within the uterus. Next are normal. No pelvic lymphadenopathy. No aggressive osseous lesion. Levoscoliosis of the spine with associated endplate spurring  IMPRESSION: 1. No nephrolithiasis or ureterolithiasis. 2. Normal appendix   Electronically Signed   By: Suzy Bouchard M.D.   On: 05/10/2013 20:33    EKG  Interpretation   None       MDM   1. Back pain    Patient presents with lower back pain radiating around to her lower abdomen. It is worse with movement. There is no evidence of a kidney stone. There's no neurologic deficits. I feel like it's likely musculoskeletal in nature. There is no evidence of UTI. She had no pelvic pain on pelvic exam. She was discharged home in good condition and encouraged to followup with her primary care physician or orthopedist. She's given a perception for Naprosyn and Vicodin.    Malvin Johns, MD 05/10/13 2043

## 2013-05-10 NOTE — Discharge Instructions (Signed)
Back Pain, Adult Low back pain is very common. About 1 in 5 people have back pain.The cause of low back pain is rarely dangerous. The pain often gets better over time.About half of people with a sudden onset of back pain feel better in just 2 weeks. About 8 in 10 people feel better by 6 weeks.  CAUSES Some common causes of back pain include:  Strain of the muscles or ligaments supporting the spine.  Wear and tear (degeneration) of the spinal discs.  Arthritis.  Direct injury to the back. DIAGNOSIS Most of the time, the direct cause of low back pain is not known.However, back pain can be treated effectively even when the exact cause of the pain is unknown.Answering your caregiver's questions about your overall health and symptoms is one of the most accurate ways to make sure the cause of your pain is not dangerous. If your caregiver needs more information, he or she may order lab work or imaging tests (X-rays or MRIs).However, even if imaging tests show changes in your back, this usually does not require surgery. HOME CARE INSTRUCTIONS For many people, back pain returns.Since low back pain is rarely dangerous, it is often a condition that people can learn to manageon their own.   Remain active. It is stressful on the back to sit or stand in one place. Do not sit, drive, or stand in one place for more than 30 minutes at a time. Take short walks on level surfaces as soon as pain allows.Try to increase the length of time you walk each day.  Do not stay in bed.Resting more than 1 or 2 days can delay your recovery.  Do not avoid exercise or work.Your body is made to move.It is not dangerous to be active, even though your back may hurt.Your back will likely heal faster if you return to being active before your pain is gone.  Pay attention to your body when you bend and lift. Many people have less discomfortwhen lifting if they bend their knees, keep the load close to their bodies,and  avoid twisting. Often, the most comfortable positions are those that put less stress on your recovering back.  Find a comfortable position to sleep. Use a firm mattress and lie on your side with your knees slightly bent. If you lie on your back, put a pillow under your knees.  Only take over-the-counter or prescription medicines as directed by your caregiver. Over-the-counter medicines to reduce pain and inflammation are often the most helpful.Your caregiver may prescribe muscle relaxant drugs.These medicines help dull your pain so you can more quickly return to your normal activities and healthy exercise.  Put ice on the injured area.  Put ice in a plastic bag.  Place a towel between your skin and the bag.  Leave the ice on for 15-20 minutes, 03-04 times a day for the first 2 to 3 days. After that, ice and heat may be alternated to reduce pain and spasms.  Ask your caregiver about trying back exercises and gentle massage. This may be of some benefit.  Avoid feeling anxious or stressed.Stress increases muscle tension and can worsen back pain.It is important to recognize when you are anxious or stressed and learn ways to manage it.Exercise is a great option. SEEK MEDICAL CARE IF:  You have pain that is not relieved with rest or medicine.  You have pain that does not improve in 1 week.  You have new symptoms.  You are generally not feeling well. SEEK   IMMEDIATE MEDICAL CARE IF:   You have pain that radiates from your back into your legs.  You develop new bowel or bladder control problems.  You have unusual weakness or numbness in your arms or legs.  You develop nausea or vomiting.  You develop abdominal pain.  You feel faint. Document Released: 04/10/2005 Document Revised: 10/10/2011 Document Reviewed: 08/29/2010 [employer] Patient Information 2014 [employer]  

## 2013-05-10 NOTE — ED Notes (Signed)
Pt states she's had R lower back pain radiating to abdomen x 3 days.  Pt states pain has gotten progressively worse.  Pt states she had similar episode 1 year ago with no definitive diagnosis.

## 2013-05-12 LAB — GC/CHLAMYDIA PROBE AMP
CT Probe RNA: NEGATIVE
GC Probe RNA: NEGATIVE

## 2013-05-21 ENCOUNTER — Ambulatory Visit (INDEPENDENT_AMBULATORY_CARE_PROVIDER_SITE_OTHER): Payer: 59 | Admitting: Gynecology

## 2013-05-21 ENCOUNTER — Encounter: Payer: Self-pay | Admitting: Gynecology

## 2013-05-21 VITALS — BP 138/80 | Resp 20 | Ht 64.0 in | Wt 222.0 lb

## 2013-05-21 DIAGNOSIS — Z30431 Encounter for routine checking of intrauterine contraceptive device: Secondary | ICD-10-CM

## 2013-05-21 DIAGNOSIS — M25559 Pain in unspecified hip: Secondary | ICD-10-CM

## 2013-05-21 NOTE — Progress Notes (Signed)
Subjective:     Patient ID: Sherry Case, female   DOB: May 04, 1970, 43 y.o.   MRN: [id number]  HPI Comments: Pt here for 29m check after IUD placement.  Pt states that she had bled lightly for 2w, no fever or chills.  No other gyn complaints.  Pt reports being seen in ER for right leg pain that radiated to her knee.  She had a CT which showed normal placement of her IUD, she just finished a dose pack of steroids and is starting to feel better but not 100%. She is also having tingling in her upper extremties right >left.  She is interested in IM referral.     Review of Systems Per HPI     Objective:   Physical Exam  Constitutional: She is oriented to person, place, and time. She appears well-developed and well-nourished.  Neurological: She is alert and oriented to person, place, and time.  Skin: Skin is warm and dry.  Pelvic exam: VULVA: normal appearing vulva with no masses, tenderness or lesions, VAGINA: normal appearing vagina with normal color and discharge, no lesions, CERVIX: normal appearing cervix without discharge or lesions, IUD strings noted, no CMT, UTERUS: uterus is normal size, shape, consistency and nontender, limited by habitus, ADNEXA: no masses.      Assessment:     IUD check Hip pain-bursitis     Plan:     IUD doing well f/u prn Pt to f/u with ortho as scheduled and recommend she inform them regarding her bilateral shoulder tingling sensation  Names of IM given

## 2013-06-05 ENCOUNTER — Encounter: Payer: Self-pay | Admitting: Gynecology

## 2013-06-06 ENCOUNTER — Telehealth: Payer: Self-pay | Admitting: Gynecology

## 2013-06-06 ENCOUNTER — Ambulatory Visit (INDEPENDENT_AMBULATORY_CARE_PROVIDER_SITE_OTHER): Payer: 59 | Admitting: Gynecology

## 2013-06-06 VITALS — BP 136/84 | HR 74 | Resp 18 | Ht 64.0 in | Wt 222.0 lb

## 2013-06-06 DIAGNOSIS — Z30430 Encounter for insertion of intrauterine contraceptive device: Secondary | ICD-10-CM

## 2013-06-06 DIAGNOSIS — N920 Excessive and frequent menstruation with regular cycle: Secondary | ICD-10-CM

## 2013-06-06 DIAGNOSIS — T8339XA Other mechanical complication of intrauterine contraceptive device, initial encounter: Secondary | ICD-10-CM

## 2013-06-06 DIAGNOSIS — T8389XA Other specified complication of genitourinary prosthetic devices, implants and grafts, initial encounter: Secondary | ICD-10-CM

## 2013-06-06 LAB — CBC WITH DIFFERENTIAL/PLATELET
Basophils Absolute: 0.1 10*3/uL (ref 0.0–0.1)
Basophils Relative: 0 % (ref 0–1)
Eosinophils Absolute: 0.5 10*3/uL (ref 0.0–0.7)
Eosinophils Relative: 4 % (ref 0–5)
HEMATOCRIT: 37.9 % (ref 36.0–46.0)
HEMOGLOBIN: 12.6 g/dL (ref 12.0–15.0)
LYMPHS PCT: 16 % (ref 12–46)
Lymphs Abs: 2.1 10*3/uL (ref 0.7–4.0)
MCH: 27.5 pg (ref 26.0–34.0)
MCHC: 33.2 g/dL (ref 30.0–36.0)
MCV: 82.6 fL (ref 78.0–100.0)
Monocytes Absolute: 0.9 10*3/uL (ref 0.1–1.0)
Monocytes Relative: 7 % (ref 3–12)
Neutro Abs: 9.5 10*3/uL — ABNORMAL HIGH (ref 1.7–7.7)
Neutrophils Relative %: 73 % (ref 43–77)
PLATELETS: 408 10*3/uL — AB (ref 150–400)
RBC: 4.59 MIL/uL (ref 3.87–5.11)
RDW: 16.8 % — ABNORMAL HIGH (ref 11.5–15.5)
WBC: 13.1 10*3/uL — AB (ref 4.0–10.5)

## 2013-06-06 LAB — POCT URINE PREGNANCY: Preg Test, Ur: NEGATIVE

## 2013-06-06 MED ORDER — NORETHINDRONE ACETATE 5 MG PO TABS: 5.0000 mg | ORAL_TABLET | Freq: Every day | ORAL | Status: DC

## 2013-06-06 NOTE — Progress Notes (Signed)
Subjective:     Patient ID: Sherry Case, female   DOB: 1970-07-22, 43 y.o.   MRN: [id number]  HPI Comments: Pt called to report passage of her mirena iud placed 52m prior.  Pt states that she has had variable bleeding since it was placed and noticed an increase in bleeding with passage of clots but no cramping or pain.  Pt reports seeing the IUD in a clot on her pad.  Pt is now having some cramping and continues to pass clots.  She brought in the iud    Review of Systems  Constitutional: Negative for fever, chills and fatigue.  Gastrointestinal: Negative for nausea and vomiting.  Genitourinary: Positive for vaginal bleeding and menstrual problem. Negative for vaginal discharge, vaginal pain, pelvic pain and dyspareunia.       Objective:   Physical Exam  Nursing note and vitals reviewed. Constitutional: She is oriented to person, place, and time. She appears well-developed and well-nourished.  Abdominal: Soft. She exhibits no distension. There is no tenderness. There is no rebound and no guarding.  Neurological: She is alert and oriented to person, place, and time.  Skin: Skin is warm and dry.  Pelvic exam:  VULVA: normal appearing vulva with no masses, tenderness or lesions, blood on perineum  VAGINA: moderate amount of clotted and nonclotted blood in vault, no malodor CERVIX: parous, clot as os, os slightly open,  UTERUS: uterus is normal size, shape, consistency and nontender,  ADNEXA: no masses.      Assessment:     menorrhagia with expulsion of IUD     Plan:     Suggest EMB-risks and benefits reviewed and accepted CBC w/ diff, HCG   Procedure: Speculum placed, vagina cleared of clots and debris, cervix cleansed with betadine x3, milex pipelle advanced thru cervix, sounded to 9cm, moderate amount of blood, clot and tissue obtained. To pathology Pt tolerated well.  Uterus massaged and speculum replaced, confirmed improvement of bleeding, pt observed for 10 more minutes  and bleeding appeared better controlled Aygestin started TID until bleeding stops, instructed to call office if no improvement by Monday morning    Total of 34m spent with pt, treating and diagnosing bleeding >50% face to face

## 2013-06-06 NOTE — Telephone Encounter (Signed)
Patient states that IUD came out last night "with a large clot of blood", states she was bleeding last night and changing pads every hour. Today, unable to provide assessment of bleeding as she was driving.  Offered appointment today, she is unable to come as she has volunteered to help with a valentines party at her child's school. Scheduled for 2:30 as patient states that time will work best for her.  Advised would call back if Dr. Charlies Constable had any further instructions.

## 2013-06-06 NOTE — Telephone Encounter (Signed)
Left patient a message to call back and get scheduled for a visit today per Dr. [name] Heck:  Sherry Case,  Pt of Dr. Brion Aliment. Left a message through MyChart about heavy bleeding and her IUD coming out. Needs appt today. Please call. Thanks.   MSM

## 2013-06-06 NOTE — Patient Instructions (Signed)
Start aygestin 3x/d until bleeding stops then decrease to twice a day for 7d then once a day for 10d

## 2013-06-06 NOTE — Telephone Encounter (Signed)
Pt states her IUD fell out last night she would like to schedule an appointment

## 2013-06-07 LAB — HCG, QUANTITATIVE, PREGNANCY: hCG, Beta Chain, Quant, S: <2 m[IU]/mL

## 2013-06-09 MED ORDER — DOXYCYCLINE HYCLATE 100 MG PO CAPS: 100.0000 mg | ORAL_CAPSULE | Freq: Two times a day (BID) | ORAL | Status: DC

## 2013-06-09 NOTE — Telephone Encounter (Signed)
Pt called back, overall is feeling well but does report passing some clots last night and bleeding is much lighter this am.  Pt is still taking the aygestin TID.  Pt had cbbc with diff and was noted to have a slight elevation in her wbc with left shift, I suggest we add doxycycline and she is agreeable, we will send in order, we asked that she call back later this week, we offered her an appt tomorrow morning, she will call back if she decides to be seen

## 2013-06-09 NOTE — Telephone Encounter (Signed)
LM, checking up on pt from the weekend re bleeding

## 2013-06-13 ENCOUNTER — Encounter: Payer: Self-pay | Admitting: Gynecology

## 2013-06-13 ENCOUNTER — Ambulatory Visit (INDEPENDENT_AMBULATORY_CARE_PROVIDER_SITE_OTHER): Payer: 59 | Admitting: Gynecology

## 2013-06-13 VITALS — BP 128/74 | HR 80 | Resp 16 | Ht 64.0 in | Wt 225.0 lb

## 2013-06-13 DIAGNOSIS — E8881 Metabolic syndrome: Secondary | ICD-10-CM | POA: Insufficient documentation

## 2013-06-13 DIAGNOSIS — N938 Other specified abnormal uterine and vaginal bleeding: Secondary | ICD-10-CM | POA: Insufficient documentation

## 2013-06-13 DIAGNOSIS — R7303 Prediabetes: Secondary | ICD-10-CM

## 2013-06-13 DIAGNOSIS — Z01419 Encounter for gynecological examination (general) (routine) without abnormal findings: Secondary | ICD-10-CM

## 2013-06-13 DIAGNOSIS — Z1239 Encounter for other screening for malignant neoplasm of breast: Secondary | ICD-10-CM

## 2013-06-13 DIAGNOSIS — N949 Unspecified condition associated with female genital organs and menstrual cycle: Secondary | ICD-10-CM

## 2013-06-13 DIAGNOSIS — R7309 Other abnormal glucose: Secondary | ICD-10-CM

## 2013-06-13 LAB — BASIC METABOLIC PANEL
BUN: 11 mg/dL (ref 6–23)
CO2: 27 meq/L (ref 19–32)
Calcium: 9.9 mg/dL (ref 8.4–10.5)
Chloride: 103 mEq/L (ref 96–112)
Creat: 0.59 mg/dL (ref 0.50–1.10)
GLUCOSE: 107 mg/dL — AB (ref 70–99)
POTASSIUM: 4 meq/L (ref 3.5–5.3)
Sodium: 139 mEq/L (ref 135–145)

## 2013-06-13 LAB — CBC WITH DIFFERENTIAL/PLATELET
Basophils Absolute: 0.1 10*3/uL (ref 0.0–0.1)
Basophils Relative: 1 % (ref 0–1)
EOS ABS: 0.5 10*3/uL (ref 0.0–0.7)
Eosinophils Relative: 4 % (ref 0–5)
HEMATOCRIT: 33.9 % — AB (ref 36.0–46.0)
HEMOGLOBIN: 11.1 g/dL — AB (ref 12.0–15.0)
LYMPHS ABS: 1.9 10*3/uL (ref 0.7–4.0)
Lymphocytes Relative: 17 % (ref 12–46)
MCH: 27.2 pg (ref 26.0–34.0)
MCHC: 32.7 g/dL (ref 30.0–36.0)
MCV: 83.1 fL (ref 78.0–100.0)
MONO ABS: 0.7 10*3/uL (ref 0.1–1.0)
Monocytes Relative: 6 % (ref 3–12)
NEUTROS ABS: 8.2 10*3/uL — AB (ref 1.7–7.7)
NEUTROS PCT: 72 % (ref 43–77)
Platelets: 404 10*3/uL — ABNORMAL HIGH (ref 150–400)
RBC: 4.08 MIL/uL (ref 3.87–5.11)
RDW: 16.9 % — ABNORMAL HIGH (ref 11.5–15.5)
WBC: 11.4 10*3/uL — ABNORMAL HIGH (ref 4.0–10.5)

## 2013-06-13 LAB — IPS CERVICAL/ECC/EMB/VULVAR/VAGINAL BIOPSY

## 2013-06-13 MED ORDER — METFORMIN HCL 850 MG PO TABS: 850.0000 mg | ORAL_TABLET | Freq: Two times a day (BID) | ORAL | Status: DC

## 2013-06-13 MED ORDER — METOCLOPRAMIDE HCL 10 MG PO TABS
10.0000 mg | ORAL_TABLET | Freq: Four times a day (QID) | ORAL | Status: DC | PRN
Start: 2013-06-13 — End: 2013-08-27

## 2013-06-13 MED ORDER — ESTROGENS CONJUGATED 1.25 MG PO TABS: 2.5000 mg | ORAL_TABLET | Freq: Every day | ORAL | Status: DC

## 2013-06-13 NOTE — Progress Notes (Signed)
Patient scheduled for Screening Mammogram at [facility] for 06/17/13 at 11:00. Agreeable to time/date/location.

## 2013-06-13 NOTE — Progress Notes (Signed)
43 y.o. Married Caucasian female   [phone] here for annual exam. Pt is currently sexually active.  Pt was seen for DUB with ejection of IUD, pt was started on aygestin after and reports that she is still bleeding, less during the day but clots-large at night. Started on doxycycline after elevated wbc with left shift. Bx with inactive endometrium-progestin effect.  No recent sex.    Pt reports spotting at most during the day but will pass large clots at night to the point of changing her under garments and wearing double pads  Patient's last menstrual period was 04/10/2013.          Sexually active: no not currently due to bleeding The current method of family planning is none.    Exercising: no  The patient does not participate in regular exercise at present. Last pap: 02/03/13 NEG HR HPV Alcohol: no Tobacco: no BSE: yes  Mammogram: Years ago   Labs: Recently checked with [place]  Health Maintenance  Topic Date Due  . Tetanus/tdap  11/22/1989  . Influenza Vaccine  11/22/2012  . Pap Smear  02/04/2016  . Colonoscopy  02/22/2018    Family History  Problem Relation Age of Onset  . Colon cancer Mother 36    stage 1  . Diabetes Mother   . Lymphoma Paternal Grandmother   . Breast cancer Cousin     Patient Active Problem List   Diagnosis Date Noted  . Pre-diabetes 03/14/2013  . Menorrhagia 03/14/2013  . Essential hypertension, benign 03/14/2013  . PCOS (polycystic ovarian syndrome)     Past Medical History  Diagnosis Date  . PCOS (polycystic ovarian syndrome)   . Hypertension   . Colon polyps 2014    hyperplastic,     Past Surgical History  Procedure Laterality Date  . Dilation and curettage of uterus      x2   . Breast lumpectomy Right 20 years ago    Benign Enlarged Lymph Node  . Tonsillectomy and adenoidectomy  32 year ago  . Finger surgery  25 years ago    Allergies: Review of patient's allergies indicates no known allergies.  Current Outpatient Prescriptions   Medication Sig Dispense Refill  . doxycycline (VIBRAMYCIN) 100 MG capsule Take 1 capsule (100 mg total) by mouth 2 (two) times daily. Take BID for 10 days.  Take with food as can cause GI distress.  20 capsule  0  . metFORMIN (GLUCOPHAGE) 500 MG tablet Take 1 tablet (500 mg total) by mouth 2 (two) times daily with a meal. Start after completing first presciption  180 tablet  0  . norethindrone (AYGESTIN) 5 MG tablet Take 1 tablet (5 mg total) by mouth daily.  50 tablet  0  . levonorgestrel (MIRENA) 20 MCG/24HR IUD 1 Intra Uterine Device (1 each total) by Intrauterine route once.  1 each  0   No current facility-administered medications for this visit.    ROS: Pertinent items are noted in HPI.  Exam:    BP 128/74  Pulse 80  Resp 16  Ht 5\' 4"  (1.626 m)  LMP 04/10/2013 Weight change: @WEIGHTCHANGE @ Last 3 height recordings:  Ht Readings from Last 3 Encounters:  06/13/13 5\' 4"  (1.626 m)  06/06/13 5\' 4"  (1.626 m)  05/21/13 5\' 4"  (1.626 m)   General appearance: alert, cooperative and appears stated age Head: Normocephalic, without obvious abnormality, atraumatic Neck: no adenopathy, no carotid bruit, no JVD, supple, symmetrical, trachea midline and thyroid not enlarged, symmetric, no tenderness/mass/nodules Lungs: clear  to auscultation bilaterally Breasts: normal appearance, no masses or tenderness Heart: regular rate and rhythm, S1, S2 normal, no murmur, click, rub or gallop Abdomen: soft, non-tender; bowel sounds normal; no masses,  no organomegaly Extremities: extremities normal, atraumatic, no cyanosis or edema Skin: Skin color, texture, turgor normal. No rashes or lesions Lymph nodes: Cervical, supraclavicular, and axillary nodes normal. no inguinal nodes palpated Neurologic: Grossly normal   Pelvic: External genitalia:  no lesions              Urethra: normal appearing urethra with no masses, tenderness or lesions              Bartholins and Skenes: normal                  Vagina: normal appearing vagina with normal color and discharge, no lesions              Cervix: normal appearance and patulous              Pap taken: no        Bimanual Exam:  Uterus:  uterus is normal size, shape, consistency and nontender                                      Adnexa:    no masses                                      Rectovaginal: Confirms                                      Anus:  normal sphincter tone, no lesions  A: well woman DUB PCOS Elevated WBC with left shift Contraceptive management     P: mammogram overdue!! Will schedule here before leaving Will change to estrogen but continue doxycycline for bleeding if continues beyond weekend, will proceed with SHG, risks of estrogens reviewed, pt aware to look for DVT signs Repeat cbc with diff,  Refill metformin, increase dose-BMET return annually or prn   An After Visit Summary was printed and given to the patient.

## 2013-06-13 NOTE — Patient Instructions (Signed)
Estrogen 3x/d until bleeding stops reglan as needed for nausea Continue doxycycline

## 2013-06-16 ENCOUNTER — Telehealth: Payer: Self-pay | Admitting: Gynecology

## 2013-06-16 NOTE — Telephone Encounter (Signed)
Called pt this am to see how she is tolerating her premarin.  Pt reports that she is still bleeding but less so, no more clots.  No nausea.  Still taking the doxycycline.  No fevers. Informed that her WBC's are coming down, H/H a little lower.  BMET normal, we agree to reassess Wednesday 12p, she call call to be seen earlier if the bleeding gets worse before then

## 2013-06-17 ENCOUNTER — Ambulatory Visit
Admission: RE | Admit: 2013-06-17 | Discharge: 2013-06-17 | Disposition: A | Discharge status: Home / Self Care | Payer: 59 | Source: Ambulatory Visit | Attending: Gynecology | Admitting: Gynecology

## 2013-06-17 DIAGNOSIS — Z1239 Encounter for other screening for malignant neoplasm of breast: Secondary | ICD-10-CM

## 2013-06-18 ENCOUNTER — Ambulatory Visit (INDEPENDENT_AMBULATORY_CARE_PROVIDER_SITE_OTHER): Payer: 59 | Admitting: Gynecology

## 2013-06-18 ENCOUNTER — Encounter: Payer: Self-pay | Admitting: Gynecology

## 2013-06-18 VITALS — BP 116/70 | HR 72 | Resp 18 | Ht 64.0 in | Wt 224.0 lb

## 2013-06-18 DIAGNOSIS — N920 Excessive and frequent menstruation with regular cycle: Secondary | ICD-10-CM

## 2013-06-18 DIAGNOSIS — N938 Other specified abnormal uterine and vaginal bleeding: Secondary | ICD-10-CM

## 2013-06-18 DIAGNOSIS — N949 Unspecified condition associated with female genital organs and menstrual cycle: Secondary | ICD-10-CM

## 2013-06-18 DIAGNOSIS — E282 Polycystic ovarian syndrome: Secondary | ICD-10-CM

## 2013-06-18 MED ORDER — MEGESTROL ACETATE 40 MG PO TABS: 40.0000 mg | ORAL_TABLET | Freq: Three times a day (TID) | ORAL | Status: DC

## 2013-06-18 NOTE — Patient Instructions (Addendum)
Call if no response to megace Take otc multivitamin and iron  F/u u/s on Tuesday 3/3, we will call you with appt Stop other hormones  Iron Deficiency Anemia, Adult Anemia is a condition in which there are less red blood cells or hemoglobin in the blood than normal. Hemoglobin is this part of red blood cells that carries oxygen. Iron deficiency anemia is anemia caused by too little iron. It is the most common type of anemia. It may leave you tired and short of breath. CAUSES   Lack of iron in the diet.  Poor absorption of iron, as seen with intestinal disorders.  Intestinal bleeding.  Heavy periods. SIGNS AND SYMPTOMS  Mild anemia may not be noticeable. Symptoms may include:  Fatigue.  Headache.  Pale skin.  Weakness.  Tiredness.  Shortness of breath.  Dizziness.  Cold hands and feet.  Fast or irregular heartbeat. DIAGNOSIS  Diagnosis requires a thorough evaluation and physical exam by your health care provider. Blood tests are generally used to confirm iron deficiency anemia. Additional tests may be done to find the underlying cause of your anemia. These may include:  Testing for blood in the stool (fecal occult blood test).  A procedure to see inside the colon and rectum (colonoscopy).  A procedure to see inside the esophagus and stomach (endoscopy). TREATMENT  Iron deficiency anemia is treated by correcting the cause of the deficiency. Treatment may involve:  Adding iron-rich foods to your diet.  Taking iron supplements. Pregnant or breastfeeding women need to take extra iron, because their normal diet usually does not provide the required amount.  Taking vitamins. Vitamin C improves the absorption of iron. Your health care provider may recommend taking your iron tablets with a glass of orange juice or vitamin C supplement.  Medicines to make heavy menstrual flow lighter.  Surgery. HOME CARE INSTRUCTIONS   Take iron as directed by your health care  provider.  If you cannot tolerate taking iron supplements by mouth, talk to your health care provider about taking them through a vein (intravenously) or an injection into a muscle.  For the best iron absorption, iron supplements should be taken on an empty stomach. If you cannot tolerate them on an empty stomach, you may need to take them with food.  Do not drink milk or take antacids at the same time as your iron supplements. Milk and antacids may interfere with the absorption of iron.  Iron supplements can cause constipation. Make sure to include fiber in your diet to prevent constipation. A stool softener may also be recommended.  Take vitamins as directed by your health care provider.  Eat a diet rich in iron. Foods high in iron include liver, lean beef, whole-grain bread, eggs, dried fruit, and dark green, leafy vegetables. SEEK IMMEDIATE MEDICAL CARE IF:   You faint. If this happens, do not drive. Call your local emergency services (911 in [place] if no other help is available.  You have chest pain.  You feel nauseous or vomit.  You have severe or increased shortness of breath with activity.  You feel weak.  You have a rapid heartbeat.  You have unexplained sweating.  You become lightheaded when getting up from a chair or bed. MAKE SURE YOU:   Understand these instructions.  Will watch your condition.  Will get help right away if you are not doing well or get worse. Document Released: 04/07/2000 Document Revised: 01/29/2013 Document Reviewed: 12/16/2012 [facility] Patient Information 2014 [employer].

## 2013-06-18 NOTE — Progress Notes (Signed)
Subjective:     Patient ID: Sherry Case, female   DOB: [birth year], 43 y.o.   MRN: [id number]  HPI Comments: Pt seen today for f/u of her DUB.  We spoke to her 2d ago and at that time she reported a marked decrease in her bleeding, but today she states that her bleeding had gotten worse but since she had this appt.  Pt is now passing clots with light flow in between.  Clots are large    Review of Systems  Constitutional: Negative for fever and chills.  Respiratory: Negative for shortness of breath.   Genitourinary: Positive for vaginal bleeding and menstrual problem. Negative for dysuria, flank pain, vaginal discharge and pelvic pain.  Neurological: Negative for dizziness and light-headedness.       Objective:   Physical Exam  Nursing note and vitals reviewed. Constitutional: She is oriented to person, place, and time. She appears well-developed and well-nourished.  Abdominal: Soft. She exhibits no distension. There is no tenderness.  Neurological: She is alert and oriented to person, place, and time.  Skin: Skin is warm and dry.   Pelvic: External genitalia:  no lesions              Urethra:  normal appearing urethra with no masses, tenderness or lesions              Bartholins and Skenes: normal                 Vagina: dark clot in vault,               Cervix: normal appearance, minimal trickle from os with valsalva, no CMT                   Bimanual Exam:  Uterus:  uterus is normal size, shape, consistency and nontender                                      Adnexa: normal adnexa in size, nontender and no masses                                         Assessment:     DUB PCOS Recent ejection of IUD     Plan:     Pt with marginal response to hormonal manipulation, antibiotics U/s for GA- recommend repeat here Will try to stop with megace 40mg  TID otc iron

## 2013-06-20 ENCOUNTER — Telehealth: Payer: Self-pay | Admitting: Gynecology

## 2013-06-20 NOTE — Telephone Encounter (Signed)
Called to see how pt is doing on megace 40mg  TID, she states that her bleeding has decreased a lot and only spotting at this point.  She says that she feels like she has more energy now.  No issues with bloating or nausea.  She is scheduled for [facility] next Tuesday, she will continue the megace as is until then.

## 2013-06-24 ENCOUNTER — Ambulatory Visit (INDEPENDENT_AMBULATORY_CARE_PROVIDER_SITE_OTHER): Payer: 59

## 2013-06-24 ENCOUNTER — Other Ambulatory Visit: Payer: Self-pay | Admitting: *Deleted

## 2013-06-24 ENCOUNTER — Ambulatory Visit (INDEPENDENT_AMBULATORY_CARE_PROVIDER_SITE_OTHER): Payer: 59 | Admitting: Gynecology

## 2013-06-24 ENCOUNTER — Other Ambulatory Visit: Payer: Self-pay | Admitting: Gynecology

## 2013-06-24 ENCOUNTER — Encounter: Payer: Self-pay | Admitting: Gynecology

## 2013-06-24 VITALS — BP 114/80 | Resp 16 | Ht 64.0 in | Wt 224.0 lb

## 2013-06-24 DIAGNOSIS — N949 Unspecified condition associated with female genital organs and menstrual cycle: Secondary | ICD-10-CM

## 2013-06-24 DIAGNOSIS — E282 Polycystic ovarian syndrome: Secondary | ICD-10-CM

## 2013-06-24 DIAGNOSIS — Z309 Encounter for contraceptive management, unspecified: Secondary | ICD-10-CM

## 2013-06-24 DIAGNOSIS — N938 Other specified abnormal uterine and vaginal bleeding: Secondary | ICD-10-CM

## 2013-06-24 DIAGNOSIS — T8339XA Other mechanical complication of intrauterine contraceptive device, initial encounter: Secondary | ICD-10-CM

## 2013-06-24 DIAGNOSIS — N920 Excessive and frequent menstruation with regular cycle: Secondary | ICD-10-CM

## 2013-06-24 DIAGNOSIS — N925 Other specified irregular menstruation: Secondary | ICD-10-CM

## 2013-06-24 MED ORDER — NORETHINDRONE 0.35 MG PO TABS: 1.0000 | ORAL_TABLET | Freq: Every day | ORAL | Status: DC

## 2013-06-24 NOTE — Patient Instructions (Signed)
F/u 55m Call for issues with bleeding

## 2013-06-24 NOTE — Progress Notes (Signed)
      Pt here for [facility] [employer] f/u after episode of DUB that did not respond to aygestin or estrogen but has now responded to megace 40mg  TID.  Pt has run out of meds but has not started bleeding as of yet.   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  We recommend proceeding with SHG.  Consent obtained. Speculum placed, cervix cleansed with betadine, insemination catheter placed and walls gently distended. No endometrial defects were noted, walls do appear slightly irregular. Pt had an emb prior to hormonal therapy with progestin affect. We suggest switching from megace to micornor for minimum of 2-6m after which she can consider placing IUD again.  Pt aware that she should use condoms for the first  Month of micronor, btb may occur. Questions addressed. She will also be starting the higher dose of metformin by next week after she completes her Rx. We have asked her to let us know how she tolerates the new Rx-both metformin and micornor She agrees. We will see her back in 58m at which time we may place IUD  Total of 42m spent, portion of which was discussing DUB, PCOS and treatment, >50% face to face

## 2013-06-26 ENCOUNTER — Telehealth: Payer: Self-pay | Admitting: Gynecology

## 2013-06-26 NOTE — Telephone Encounter (Signed)
Advised patient of 0 liability for iud insertion.

## 2013-07-07 ENCOUNTER — Encounter: Payer: Self-pay | Admitting: Gynecology

## 2013-07-07 ENCOUNTER — Telehealth: Payer: Self-pay | Admitting: Gynecology

## 2013-07-07 DIAGNOSIS — N938 Other specified abnormal uterine and vaginal bleeding: Secondary | ICD-10-CM

## 2013-07-07 DIAGNOSIS — N920 Excessive and frequent menstruation with regular cycle: Secondary | ICD-10-CM

## 2013-07-07 MED ORDER — MEGESTROL ACETATE 40 MG PO TABS: ORAL_TABLET | ORAL | Status: DC

## 2013-07-07 NOTE — Telephone Encounter (Signed)
Spoke with pt who is about to start her third week on her new OCP, norethindrone.  Pt reports that for the first week and a half she had some bleeding that ranged from light spotting to a mild period. On Saturday and Sunday she had heavier bleeding changing her protection every hour to 1.5 hours. Pt noticed clots. Today the bleeding is less and she is changing protection every 2-3 hours. Pt reports she has been dealing with bleeding for about 5 months and wanted to update Dr. Charlies Constable about what is happening with this pill. Should she continue? Is this to be expected?

## 2013-07-07 NOTE — Telephone Encounter (Signed)
Pt is still bleeding even after the new birth control.

## 2013-07-10 NOTE — Telephone Encounter (Signed)
Dr. Charlies Constable has been in contact with patient via email.  Please see email encounter.

## 2013-07-21 NOTE — Telephone Encounter (Signed)
Tc to pt- she has just started aygestin once a day and has stopped bleeding, she was having intermittent spotting after decreasing dose bu thas not had any bleeding/spotting for over 1w. She will plan on seeing [place] back after she returns from spring break, she will continue the aygestin until that time

## 2013-08-05 ENCOUNTER — Telehealth: Payer: Self-pay | Admitting: Gynecology

## 2013-08-05 NOTE — Telephone Encounter (Signed)
Spoke with patient.  She has Megace 40 mg Tablets and has been taking one per day. I advised per message from Dr. Charlies Constable that she should now be on 20 mg Megace per day and until she is seen by Dr. Charlies Constable. Patient will start taking 1/2 tablet per day until she is seen by Dr. Charlies Constable. Patient denies any vaginal bleeding, states she is feeling well. I advised if any further instructions from Dr. Charlies Constable I would call back, patient is agreeable.   Routing to provider for final review. Patient agreeable to disposition. Will close encounter

## 2013-08-05 NOTE — Telephone Encounter (Signed)
Patient is asking if she should continue taking Megestrol until her reck appointment. 08/18/13.

## 2013-08-05 NOTE — Telephone Encounter (Signed)
Return a call from [place].

## 2013-08-18 ENCOUNTER — Ambulatory Visit (INDEPENDENT_AMBULATORY_CARE_PROVIDER_SITE_OTHER): Payer: 59 | Admitting: Gynecology

## 2013-08-18 ENCOUNTER — Encounter: Payer: Self-pay | Admitting: Gynecology

## 2013-08-18 VITALS — BP 144/70 | HR 98 | Resp 20 | Ht 64.0 in | Wt 225.0 lb

## 2013-08-18 DIAGNOSIS — E282 Polycystic ovarian syndrome: Secondary | ICD-10-CM

## 2013-08-18 DIAGNOSIS — N938 Other specified abnormal uterine and vaginal bleeding: Secondary | ICD-10-CM

## 2013-08-18 DIAGNOSIS — N949 Unspecified condition associated with female genital organs and menstrual cycle: Secondary | ICD-10-CM

## 2013-08-18 DIAGNOSIS — Z309 Encounter for contraceptive management, unspecified: Secondary | ICD-10-CM

## 2013-08-18 LAB — POCT URINE PREGNANCY: Preg Test, Ur: NEGATIVE

## 2013-08-18 NOTE — Progress Notes (Signed)
Pt here for follow up after prolonged DUB that started with expulsion of her mirena IUD.  Pt has been stable on megace and is now taking 20mg /d.  She states that she feels bloated, she is in need of contraception as they are only using withdrawal.  She is concerned that she may bleed after stopping the megace.  Pt states that she is tolerating the metformin and does feel a difference but cannot tell if she is ovulating. ROS: per HPI. BP 144/70  Pulse 98  Resp 20  Ht 5\' 4"  (1.626 m)  Wt 225 lb (102.059 kg)  BMI 38.60 kg/m2 General appearance: alert, cooperative and appears stated age Abdomen: obese soft, nontender  Pelvic: External genitalia:  no lesions              Urethra:  normal appearing urethra with no masses, tenderness or lesions              Bartholins and Skenes: normal                 Vagina: normal appearing vagina with normal color and discharge, no lesions              Cervix: normal appearance                  Bimanual Exam:  Uterus:  uterus is normal size, shape, consistency and nontender                                      Adnexa: normal adnexa in size, nontender and no masses                                       1. DUB (dysfunctional uterine bleeding) We discussed her options, she may consider an endometrial ablation-we reviewed the pros and cons and the different ablations available.  We discussed the novasure and HTA  Extensively.  She is aware of anticipated length of benefit of each and that she will need better contraception, ie BTL, essure or vasectomy, these were briefly reviewed as well.  She would like to try the IUD again and if fails will consider ablation.  Pt aware that she is at a higher risk of re-expulsion, accepts risk - IUD Insertion; Future  2. PCOS (polycystic ovarian syndrome) Doing well on metformin, will continue and watch for signs of ovulation   3. Contraception management  - IUD Insertion; Future - POCT urine pregnancy,   94m spent  discussing management of DUB and contraception, >50% face to face

## 2013-08-20 ENCOUNTER — Telehealth: Payer: Self-pay | Admitting: Gynecology

## 2013-08-20 DIAGNOSIS — N938 Other specified abnormal uterine and vaginal bleeding: Secondary | ICD-10-CM

## 2013-08-20 NOTE — Telephone Encounter (Signed)
Spoke with patient. Patient states that she came in for office visit with Dr.Lathrop on 4/27 and IUD insertion was discussed. Patient states that she had menses for 2-3 months and just recently stopped bleeding. Patient states that Dr.Lathrop advised okay to schedule without starting menses. Requesting morning appointment after 8am and not on Monday or Wednesday. Appointment scheduled for 9:00am on 5/8 with Dr.Lathrop. Pre procedure instructions given.  Motrin instructions given. Motrin=Advil=Ibuprofen, 800 mg one hour before appointment. Eat a meal and hydrate well before appointment.  Patient agreeable and verbalizes understanding.   Dr.Lathrop, Okay to keep patient scheduled for 5/8 at 9:00am?

## 2013-08-20 NOTE — Telephone Encounter (Signed)
Spoke with patient. Advised that per benefits quote received, IUD and insertion is covered at 100%. There will be 0 patient liability. Patient is to call within the first 5 days of her cycle to schedule insertion. °

## 2013-08-20 NOTE — Telephone Encounter (Signed)
Spoke with patient. Advised will need blood pregnancy test 2 days before IUD insertion. Patient agreeable. 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 Appointment scheduled for 5/6 at 11:30. Patient agreeable to date and time.  Routing to provider for final review. Patient agreeable to disposition. Will close encounter

## 2013-08-20 NOTE — Telephone Encounter (Signed)
PT SHOULD GET NEG QUAL 1-2D BEFORE

## 2013-08-25 ENCOUNTER — Other Ambulatory Visit (INDEPENDENT_AMBULATORY_CARE_PROVIDER_SITE_OTHER): Payer: 59

## 2013-08-25 DIAGNOSIS — N949 Unspecified condition associated with female genital organs and menstrual cycle: Secondary | ICD-10-CM

## 2013-08-25 DIAGNOSIS — N938 Other specified abnormal uterine and vaginal bleeding: Secondary | ICD-10-CM

## 2013-08-26 LAB — HCG, SERUM, QUALITATIVE: Preg, Serum: NEGATIVE

## 2013-08-27 ENCOUNTER — Encounter: Payer: Self-pay | Admitting: Gynecology

## 2013-08-27 ENCOUNTER — Other Ambulatory Visit: Payer: 59

## 2013-08-27 ENCOUNTER — Ambulatory Visit (INDEPENDENT_AMBULATORY_CARE_PROVIDER_SITE_OTHER): Payer: 59 | Admitting: Gynecology

## 2013-08-27 VITALS — BP 110/88 | HR 88 | Resp 18 | Ht 64.0 in | Wt 222.0 lb

## 2013-08-27 DIAGNOSIS — N925 Other specified irregular menstruation: Secondary | ICD-10-CM

## 2013-08-27 DIAGNOSIS — N938 Other specified abnormal uterine and vaginal bleeding: Secondary | ICD-10-CM

## 2013-08-27 DIAGNOSIS — N949 Unspecified condition associated with female genital organs and menstrual cycle: Secondary | ICD-10-CM

## 2013-08-27 DIAGNOSIS — Z30430 Encounter for insertion of intrauterine contraceptive device: Secondary | ICD-10-CM

## 2013-08-27 DIAGNOSIS — Z309 Encounter for contraceptive management, unspecified: Secondary | ICD-10-CM

## 2013-08-27 DIAGNOSIS — E282 Polycystic ovarian syndrome: Secondary | ICD-10-CM

## 2013-08-27 NOTE — Progress Notes (Signed)
28 yrs MarriedCaucasianfemale presents for  insertion of Mirena for control of menorrhagia and contraception.  Pt has mirena earlier and had acute onset of vaginal bleeding and expelled te IUD.  She was treated with aygestin and maintained on megace until placement today.  She is aware of increased risk of re-expulsion. Neg  qual HCG.  Denies any vaginal symptoms or STD concerns.    Patient read information regarding IUD insertion.  All questions addressed.    Healthy female,time, place and person Abdomen: soft, non-tender Groinno inguinal nodes palpated  Pelvic exam: Vulva;normal female genitalia  Vagina:normal vagina  Cervix:Non-tender, Negative CMT, no lesions or redness, nulliparous/parous os  Uterus:normal shape, position and consistency     Procedure:  Speculum inserted into vagina. Cervix visualized and cleansed with betadine solution X 3. Tenaculum placed on cervix at 12 o'clock position(s).  Uterus sounded to 10 centimeters.  IUD removed from sterile packet and under sterile conditions inserted to fundus of uterus.  Introducer removed without difficulty.  IUD string trimmed to 3 centimeters.  Remainder string given to patient to feel for identification.  Tenaculum removed.  No bleeding noted.  Speculum removed.  Uterus palpated normal.  Patient tolerated procedure well.  A: Insertion of Mirena, Lot # [id number], Expiration date 8/17   P:  Instructions and warnings signs given.       IUD identification card given with IUD removal 08/2018       Return visit 3M Pt instructed to watch her bleeding and to return if bleeding is as before, sooner than 5m Agrees.

## 2013-08-29 ENCOUNTER — Ambulatory Visit: Payer: 59 | Admitting: Gynecology

## 2013-09-22 ENCOUNTER — Telehealth: Payer: Self-pay | Admitting: Gynecology

## 2013-09-22 ENCOUNTER — Encounter: Payer: Self-pay | Admitting: Gynecology

## 2013-09-22 ENCOUNTER — Ambulatory Visit (INDEPENDENT_AMBULATORY_CARE_PROVIDER_SITE_OTHER): Payer: 59 | Admitting: Gynecology

## 2013-09-22 VITALS — BP 150/90 | HR 88 | Temp 98.5°F | Wt 225.8 lb

## 2013-09-22 DIAGNOSIS — T8339XA Other mechanical complication of intrauterine contraceptive device, initial encounter: Secondary | ICD-10-CM

## 2013-09-22 DIAGNOSIS — N920 Excessive and frequent menstruation with regular cycle: Secondary | ICD-10-CM

## 2013-09-22 DIAGNOSIS — E282 Polycystic ovarian syndrome: Secondary | ICD-10-CM

## 2013-09-22 MED ORDER — MEGESTROL ACETATE 40 MG PO TABS: 20.0000 mg | ORAL_TABLET | Freq: Every day | ORAL | Status: DC

## 2013-09-22 NOTE — Telephone Encounter (Signed)
Spoke with patient at time of incoming call. She states that she has been having vaginal bleeding since 5/15 and since 5/29 it has increased and she is having to change her pad q 1.5-2 hours since Friday, this also coincided some pelvic pain that started as well, describes as a dull, constant pain, has not taken any medication for pain. Denies any weakness, dizziness. She requests an appointment time of before two pm due to childcare concerns. Advised would discuss with Dr. Charlies Constable and return her call.

## 2013-09-22 NOTE — Telephone Encounter (Signed)
Pt has an IUD and has started bleeding heavily. She states Dr. Charlies Constable told her to call if she started bleeding again.

## 2013-09-22 NOTE — Progress Notes (Signed)
Subjective:     Patient ID: Sherry Case, female   DOB: [birth year], 43 y.o.   MRN: [id number]  HPI Comments: Pt had mirena IUD placed 08/27/13, for contraception and DUB, it was placed while she was on megace.  Pt reports she started bleeding 5/15 light and then began bleeding variable until 09/19/13.  Pt was passing clots of various size but IUD is still in place.  Pt's cycles were usually mid to late month.       Review of Systems  Constitutional: Positive for fatigue. Negative for fever and chills.  Genitourinary: Positive for vaginal bleeding, menstrual problem and pelvic pain (cramping).       Objective:   Physical Exam  Nursing note and vitals reviewed. Constitutional: She is oriented to person, place, and time. She appears well-developed and well-nourished.  Abdominal: Soft. There is no tenderness. There is no rebound and no guarding.  Neurological: She is alert and oriented to person, place, and time.   Pelvic: External genitalia:  no lesions              Urethra:  normal appearing urethra with no masses, tenderness or lesions              Bartholins and Skenes: normal                 Vagina: moderate clotted dark blood in vault              Cervix: normal appearance, IUD noted in cervix  Bimanual Exam:  Uterus:  uterus is normal size, shape, consistency and nontender                                      Adnexa: normal adnexa in size, nontender and no masses                                           Assessment:     PCOS DUB  Expulsion of IUD     Plan:     We reviewed her options: Endometrial ablation with BTL, essure or vasectomy Hysterectomy-pt prefers the later, we reviewed the typical post-op restrictions, she is unavailable due to travel and pleasure until September, we will schedule accordingly.  She is aware that she may be referred to a partner for surgery and is agreeable. We will start her on megace to keep her from bleeding until the OR. She should use  condoms     70m spent in counseling >50% face to face

## 2013-09-22 NOTE — Telephone Encounter (Signed)
Spoke with Dr. Charlies Constable, okay to have patient come today at 1300. She is agreeable to time.   Routing to provider for final review. Patient agreeable to disposition. Will close encounter

## 2013-09-23 ENCOUNTER — Telehealth: Payer: Self-pay | Admitting: Emergency Medicine

## 2013-09-23 NOTE — Telephone Encounter (Signed)
Message copied by Michele Mcalpine on Tue Sep 23, 2013 10:16 AM ------      Message from: Elveria Rising      Created: Mon Sep 22, 2013  7:07 PM       Can you call pt and tell her I decided to restart there megace so she won't bleed as opposed to chasing her bleeding if it starts, thank you ------

## 2013-09-23 NOTE — Telephone Encounter (Signed)
Spoke with patient and message from Dr. Charlies Constable given. She is agreeable to start on Megace 20 mg daily and will call back with any concerns. Has f/u with Dr. Charlies Constable in two weeks.

## 2013-09-26 ENCOUNTER — Telehealth: Payer: Self-pay | Admitting: Gynecology

## 2013-09-26 DIAGNOSIS — N920 Excessive and frequent menstruation with regular cycle: Secondary | ICD-10-CM

## 2013-09-26 MED ORDER — MEGESTROL ACETATE 40 MG PO TABS: 40.0000 mg | ORAL_TABLET | Freq: Every day | ORAL | Status: DC

## 2013-09-26 NOTE — Telephone Encounter (Signed)
Dr.Lathrop, patient was started on megace 20mg  once a day on June 2nd. Patient is still experiencing bleeding. Please advise.

## 2013-09-26 NOTE — Telephone Encounter (Signed)
Patient calling with questions about the RX she got from Dr. Charlies Constable to help stop bleeding. She reports she is still bleeding and requests support with this.

## 2013-09-26 NOTE — Telephone Encounter (Signed)
Spoke with patient. Advised that I spoke with Dr.Lathrop and she would like patient to increase to taking Megace 40mg  once a day at this time. Advised patient new rx sent over to pharmacy so that she will have enough to get through until September. Patient agreeable and verbalizes understanding. Advised patient is bleeding continues on new dose to give our office a call back. Patient agreeable.  Routing to provider for final review. Patient agreeable to disposition. Will close encounter

## 2013-10-06 ENCOUNTER — Telehealth: Payer: Self-pay

## 2013-10-06 DIAGNOSIS — N920 Excessive and frequent menstruation with regular cycle: Secondary | ICD-10-CM

## 2013-10-06 NOTE — Telephone Encounter (Signed)
Spoke with patient. Patient states that she is still "spotting." "The first time I started taking megace she started me off on three pills for a couple of days and then decreased it." Patient is currently taking Megace 40mg  once per day and would like to know if she could take more than one per day and then decrease the dose to stop the bleeding as this has worked for her before. Patient states that she would like to know if Dr.Lathrop would like to see her on Wednesday regarding bleeding. If so she would like to keep the appointment if not patient would like to cancel. Advised would send a message over to Dr.Lathrop and give patient a call back with further recommendations. Patient agreeable.

## 2013-10-06 NOTE — Telephone Encounter (Signed)
Message copied by Jasmine Awe on Mon Oct 06, 2013  3:35 PM ------      Message from: Elveria Rising      Created: Mon Oct 06, 2013 11:57 AM       Pt is scheduled for app t on wed for IUD check, can we call her and see if she is being controled on megace and it so, cancel appt since IUD fell out?      Thanks,      TL ------

## 2013-10-07 MED ORDER — MEGESTROL ACETATE 40 MG PO TABS: 80.0000 mg | ORAL_TABLET | Freq: Every day | ORAL | Status: DC

## 2013-10-07 NOTE — Telephone Encounter (Signed)
Can increase twice a day or 2 tabs at once, and then drop back down after trip so she does not have to deal with spotting again, i will send in refll to cover dose change

## 2013-10-07 NOTE — Telephone Encounter (Signed)
Spoke with patient. Advised of message as seen below from Dr.Lathrop. Patient agreeable and verbalizes understanding.  Routing to provider for final review. Patient agreeable to disposition. Will close encounter

## 2013-10-08 ENCOUNTER — Ambulatory Visit: Payer: 59 | Admitting: Gynecology

## 2013-12-15 ENCOUNTER — Telehealth: Payer: Self-pay | Admitting: Gynecology

## 2013-12-15 NOTE — Telephone Encounter (Signed)
Spoke with patient. Patient states that she has been on megace for 3-4 months until she could have surgery. Patient is currently taking megace 40mg  daily and has not had any bleeding until 3-4 days ago. Patient states that she is experiencing spotting occasionally. Patient would like to proceed with scheduling surgery. Advised would send a message over to Dr.Lathrop and Gay Filler regarding scheduling. Advised someone would be in contact with her to start the process of scheduling. Patient agreeable. Prefers return call to cell phone.  Routing to Lamont Snowball, RN CC: Dr.Lathrop

## 2013-12-15 NOTE — Telephone Encounter (Signed)
Pt is calling to talk with the nurse she is spotting and is also ready to schedule surgery

## 2013-12-17 NOTE — Telephone Encounter (Signed)
Message left to return call to Jerard Bays at [phone].    

## 2013-12-17 NOTE — Telephone Encounter (Signed)
Spoke with patient.  Advised will schedule surgery consult with patient with partner at practice as Dr. Charlies Constable has previously discussed with patient. Patient agreeable to this.  Patient is scheduled for consult with Dr. [name] Heck for 8/31 at 0915.  Routing to provider for final review. Patient agreeable to disposition. Will close encounter

## 2013-12-22 ENCOUNTER — Ambulatory Visit (INDEPENDENT_AMBULATORY_CARE_PROVIDER_SITE_OTHER): Payer: 59 | Admitting: Obstetrics & Gynecology

## 2013-12-22 ENCOUNTER — Encounter: Payer: Self-pay | Admitting: Obstetrics & Gynecology

## 2013-12-22 VITALS — BP 144/90 | HR 72 | Ht 63.75 in | Wt 222.0 lb

## 2013-12-22 DIAGNOSIS — N8000 Endometriosis of the uterus, unspecified: Secondary | ICD-10-CM

## 2013-12-22 DIAGNOSIS — N920 Excessive and frequent menstruation with regular cycle: Secondary | ICD-10-CM

## 2013-12-23 ENCOUNTER — Telehealth: Payer: Self-pay | Admitting: Obstetrics & Gynecology

## 2013-12-23 NOTE — Telephone Encounter (Signed)
Spoke with patient. Advised that per benefit quote received, she will be responsible for $572.37 for the surgeons portion of her surgery. Explained that although she has met her $1500 ded, she has not met her $5000 out of pocket max and that her plan pays 80/20 until that oop max has been satisfied. Patient agreeable. Advised that per our office policy, this payment is due in full at least 2 weeks prior to the scheduled surgery date. Surgery is scheduled for 09.21.2015 therefore payment is due 09.07.2015. Patient agreeable. She will call back to make the payment on or before 09.07.2015.

## 2013-12-31 ENCOUNTER — Encounter (HOSPITAL_COMMUNITY): Payer: Self-pay | Admitting: Pharmacist

## 2014-01-01 NOTE — Telephone Encounter (Signed)
Patient states that she is unable to come tomorrow for a later appointment. Offered tomorrow at [employer] per [place]. Patient states that she was hoping there would be something today. Advised patient there are no openings today but could look in to next week. Patient declines. Patient states that she will keep appointment for tomorrow and have her daughter take her husband to drop him off and she will still have time to be there before his surgery.   Routing to provider for final review. Patient agreeable to disposition. Will close encounter

## 2014-01-01 NOTE — Telephone Encounter (Signed)
Pt states her husband fell off a ladder this past weekend and broke his arm and has to have surgery tomorrow and has to be there at 930. Pt has appt for preop at 9:00 am tomorrow and wants to be able to come in today or different time tomorrow.

## 2014-01-02 ENCOUNTER — Ambulatory Visit (INDEPENDENT_AMBULATORY_CARE_PROVIDER_SITE_OTHER): Payer: 59 | Admitting: Obstetrics & Gynecology

## 2014-01-02 ENCOUNTER — Encounter: Payer: Self-pay | Admitting: Obstetrics & Gynecology

## 2014-01-02 VITALS — BP 138/80 | HR 72 | Temp 98.0°F | Resp 20 | Ht 63.75 in | Wt 224.0 lb

## 2014-01-02 DIAGNOSIS — N8000 Endometriosis of the uterus, unspecified: Secondary | ICD-10-CM

## 2014-01-02 DIAGNOSIS — N809 Endometriosis, unspecified: Secondary | ICD-10-CM

## 2014-01-02 DIAGNOSIS — N920 Excessive and frequent menstruation with regular cycle: Secondary | ICD-10-CM

## 2014-01-02 DIAGNOSIS — N8003 Adenomyosis of the uterus: Secondary | ICD-10-CM

## 2014-01-02 MED ORDER — IBUPROFEN 800 MG PO TABS: 800.0000 mg | ORAL_TABLET | Freq: Three times a day (TID) | ORAL | Status: DC | PRN

## 2014-01-02 MED ORDER — OXYCODONE-ACETAMINOPHEN 5-325 MG PO TABS
2.0000 | ORAL_TABLET | ORAL | Status: DC | PRN
Start: 2014-01-02 — End: 2014-02-20

## 2014-01-02 NOTE — Progress Notes (Signed)
43 y.o. [id number] MarriedCaucasian female here for discussion of upcoming procedure.  LAVH with bilateral salpingectomy/possible BSO planned due to menorrhagia and probable adenomyosis.  Pre-op evaluation thus far has included endometrial biopsy 2/15 which was negative and [facility] 3/15 showing probable adenomyosis findings in myometrium.    Pt has used OCPs in the past but declines use again.  She has also tried a Mirena IUD x 2 which have both spontaneously been expelled.  Endometrial ablation has been discussed but due to adenomyosis finding on ultrasound, failure is much more likely.  Therefore, she has decided to proceed with definitive management.    Procedure discussed with patient.  Hospital stay, recovery and pain management all discussed.  Risks discussed including but not limited to bleeding, 1% risk of receiving a  transfusion, infection, 3-4% risk of bowel/bladder/ureteral/vascular injury discussed as well as possible need for additional surgery if injury does occur discussed.  DVT/PE and rare risk of death discussed.  My actual complications with prior surgeries discussed.  Vaginal cuff dehiscence discussed.  Hernia formation discussed.  Positioning and incision locations discussed.  Patient aware if pathology abnormal she may need additional treatment.  All questions answered.    Pt does have diabetes hx.  Last HbA1C is usually in the 6 or low 7 range.  Ob Hx:         Sexually active: Yes.   Birth control: Withdraw method Last pap: 01/2013 Neg. HR HPV: Neg Last MMG: 06/17/13 BIRADS1: Neg Tobacco: No  Past Surgical History  Procedure Laterality Date  . Dilation and curettage of uterus      x2   . Breast lumpectomy Right 20 years ago    Benign Enlarged Lymph Node  . Tonsillectomy and adenoidectomy  32 year ago  . Finger surgery  25 years ago    Past Medical History  Diagnosis Date  . PCOS (polycystic ovarian syndrome)   . Hypertension   . Colon polyps 2014    hyperplastic,      Allergies: Review of patient's allergies indicates no known allergies.  Current Outpatient Prescriptions  Medication Sig Dispense Refill  . ibuprofen (ADVIL,MOTRIN) 200 MG tablet Take 800 mg by mouth every 6 (six) hours as needed for headache.      . megestrol (MEGACE) 40 MG tablet Take 40 mg by mouth daily. Take by mouth as directed      . meloxicam (MOBIC) 15 MG tablet Take 15 mg by mouth as needed for pain (for back pain).      . metFORMIN (GLUCOPHAGE) 850 MG tablet Take 1 tablet (850 mg total) by mouth 2 (two) times daily with a meal. Start after completing first presciption  180 tablet  3  . Multiple Vitamin (MULTIVITAMIN) tablet Take 1 tablet by mouth daily.       No current facility-administered medications for this visit.    ROS: A comprehensive review of systems was negative.  Exam:    BP 138/80  Pulse 72  Temp(Src) 98 F (36.7 C) (Oral)  Resp 20  Ht 5' 3.75" (1.619 m)  Wt 224 lb (101.606 kg)  BMI 38.76 kg/m2  General appearance: alert and cooperative Head: Normocephalic, without obvious abnormality, atraumatic Neck: no adenopathy, supple, symmetrical, trachea midline and thyroid not enlarged, symmetric, no tenderness/mass/nodules Lungs: clear to auscultation bilaterally Heart: regular rate and rhythm, S1, S2 normal, no murmur, click, rub or gallop Abdomen: soft, non-tender; bowel sounds normal; no masses,  no organomegaly Extremities: extremities normal, atraumatic, no cyanosis or edema Skin:  Skin color, texture, turgor normal. No rashes or lesions Lymph nodes: Cervical, supraclavicular, and axillary nodes normal. no inguinal nodes palpated Neurologic: Grossly normal  Pelvic: External genitalia:  no lesions              Urethra: normal appearing urethra with no masses, tenderness or lesions              Bartholins and Skenes: normal                 Vagina: normal appearing vagina with normal color and discharge, no lesions              Cervix: normal  appearance              Pap taken: No.        Bimanual Exam:  Uterus:  uterus is normal size, shape, consistency and nontender                                      Adnexa:    normal adnexa in size, nontender and no masses                                      Rectovaginal: Deferred                                      Anus:  defer exam  A: Menorrhagia     Probable adenomyosis     Desires definitive management.  P:  LAVH/bilateral salpingectomy/possible BSO planned. Rx for Motrin and Percocet given. Medications/Vitamins reviewed.  Pt knows needs to stop any ASA or products with ASA in it at least 7 days prior to surgery. Hysterectomy brochure given for pre and post op instructions.  ~30 minutes spent with patient >50% of time was in face to face discussion of above.

## 2014-01-04 DIAGNOSIS — N8000 Endometriosis of the uterus, unspecified: Secondary | ICD-10-CM | POA: Insufficient documentation

## 2014-01-04 DIAGNOSIS — N8003 Adenomyosis of the uterus: Secondary | ICD-10-CM | POA: Insufficient documentation

## 2014-01-04 NOTE — Progress Notes (Signed)
Patient ID: Sherry Case, female   DOB: 1970-09-19, 43 y.o.   MRN: [id number]  43 yo MWF G5 P32(twin pregnancy)32 here to discuss her heavy and prolonged bleeding and desire for definitive treatment.  Pt has been treated with OCPs in the past and is not interested in restarting these at this time.  She has also had two mirena IUDs which were both expelled over time after placement.  Pt has been counseled by Dr. Charlies Constable (who she has seen multiple times over the past year since first visit in our office on 02/03/13) about alternative options including endometrial ablation as well.  Pt really just wants to be done.  Reports she has had heavy bleeding her entire life with flow lasting 3-4 days.  On heaviest days, will need to change super plus tampon with pad every 1 1/2 hours.  Flow has worsened with pregnancies.  Pt and spouse moved from [place] last year.  She has contemplated hysterectomy for years and is really ready to consider and proceed.  PMHx reviewed.  H/O gestational diabetes with Metformin use.  Pt reports HbA1Cs are normal but can't remember last level.   Endometrial biopsy 06/06/13 was negative for abnormal cells.  Ultrasound done 06/24/13 with 10.8 x 7.5 x 5.5cm uterus without fibroids.  Adenomyosis findings within myometrium.  Endometrium was asymmetric but biopsy had just previously been performed.  SHGM showed no intracavitary masses.  Pt has no urinary incontinence issues.  D/W pt different types of hysterectomy.  I recommend removal of cervix but keeping ovaries due to age and diabetes.  Removal of fallopian tubes for decreased ovarian cancer risks discussed.  Feel LAVH could be performed.  Largest baby was 10# 1oz.  Other two were 9#, 5oz, and 9#, 14oz.  Procedure discussed with patient.  Hospital stay, recovery and pain management all discussed.  Risks discussed including but not limited to bleeding, 1% risk of receiving a  transfusion, infection, 3-4% risk of  bowel/bladder/ureteral/vascular injury discussed as well as possible need for additional surgery if injury does occur discussed.  DVT/PE and rare risk of death discussed.  My actual complications with prior surgeries discussed.  Vaginal cuff dehiscence discussed.  Hernia formation discussed.  Positioning and incision locations discussed.  Patient aware if pathology abnormal she may need additional treatment.  All questions answered.  Pt ready to proceed.  Assessment:  Menorrhagia, failed Mirena IUD x 2, adenomyosis findings on PUS 3/15, negative endometrial biopsy  Plan:  LAVH with bilateral salpingectomy will be planned.  Surgery will be scheduled and pt contacted.   ~30 minutes spent with patient >50% of time was in face to face discussion of above.

## 2014-01-05 ENCOUNTER — Telehealth: Payer: Self-pay | Admitting: Obstetrics & Gynecology

## 2014-01-05 NOTE — Telephone Encounter (Signed)
Spoke with patient. Offered 10/6 at 2:30pm but patient declines. States that she needs an earlier appointment because her daughter gets home from school at 2:30pm. Appointment scheduled for 10/8 at 10am with Dr.Miller. Patient agreeable to date and time.  Routing to provider for final review. Patient agreeable to disposition. Will close encounter

## 2014-01-05 NOTE — Patient Instructions (Addendum)
   Your procedure is scheduled on:  Monday, Sept 21  Enter through the Main Entrance of [facility] at: 6 AM Pick up the phone at the desk and dial [phone] and inform us of your arrival.  Please call this number if you have any problems the morning of surgery: [phone]  Remember: Do not eat food or drink midnight: Sunday Take these medicines the morning of surgery with a SIP OF WATER:  None  Do not wear jewelry, make-up, or FINGER nail polish No metal in your hair or on your body. Do not wear lotions, powders, perfumes.  You may wear deodorant.  Do not bring valuables to the hospital. Contacts, dentures or bridgework may not be worn into surgery.  Leave suitcase in the car. After Surgery it may be brought to your room. For patients being admitted to the hospital, checkout time is 11:00am the day of discharge.  Home with husband Liliane Channel cell [phone] or mother-in-law  Nevin Bloodgood cell [phone].

## 2014-01-05 NOTE — Telephone Encounter (Signed)
Dr [name] Heck, do you want to see her on 01-16-14 before you are out of town or should we just wait till her surgery and then determine?

## 2014-01-05 NOTE — Telephone Encounter (Signed)
Patient's appointment " 1 week post op' 01/19/14 was dr cx. I am unable to find an appointment to reschedule this patient for 1 week post op. Sent to triage and Gay Filler.

## 2014-01-06 ENCOUNTER — Encounter (HOSPITAL_COMMUNITY): Payer: Self-pay

## 2014-01-06 ENCOUNTER — Encounter (HOSPITAL_COMMUNITY)
Admission: RE | Admit: 2014-01-06 | Discharge: 2014-01-06 | Disposition: A | Discharge status: Home / Self Care | Payer: 59 | Source: Ambulatory Visit | Attending: Obstetrics & Gynecology | Admitting: Obstetrics & Gynecology

## 2014-01-06 DIAGNOSIS — Z01812 Encounter for preprocedural laboratory examination: Secondary | ICD-10-CM | POA: Diagnosis present

## 2014-01-06 DIAGNOSIS — R1903 Right lower quadrant abdominal swelling, mass and lump: Secondary | ICD-10-CM | POA: Diagnosis not present

## 2014-01-06 DIAGNOSIS — Z01810 Encounter for preprocedural cardiovascular examination: Secondary | ICD-10-CM | POA: Diagnosis present

## 2014-01-06 DIAGNOSIS — N920 Excessive and frequent menstruation with regular cycle: Secondary | ICD-10-CM | POA: Diagnosis not present

## 2014-01-06 HISTORY — DX: Headache: R51

## 2014-01-06 HISTORY — DX: Gastro-esophageal reflux disease without esophagitis: K21.9

## 2014-01-06 LAB — BASIC METABOLIC PANEL
Anion gap: 12 (ref 5–15)
BUN: 11 mg/dL (ref 6–23)
CALCIUM: 8.9 mg/dL (ref 8.4–10.5)
CO2: 24 mEq/L (ref 19–32)
Chloride: 103 mEq/L (ref 96–112)
Creatinine, Ser: 0.71 mg/dL (ref 0.50–1.10)
GFR calc Af Amer: >90 mL/min (ref >90)
GLUCOSE: 179 mg/dL — AB (ref 70–99)
Potassium: 3.9 mEq/L (ref 3.7–5.3)
Sodium: 139 mEq/L (ref 137–147)

## 2014-01-06 LAB — CBC
HEMATOCRIT: 36.9 % (ref 36.0–46.0)
HEMOGLOBIN: 11.5 g/dL — AB (ref 12.0–15.0)
MCH: 24.7 pg — AB (ref 26.0–34.0)
MCHC: 31.2 g/dL (ref 30.0–36.0)
MCV: 79.2 fL (ref 78.0–100.0)
Platelets: 357 10*3/uL (ref 150–400)
RBC: 4.66 MIL/uL (ref 3.87–5.11)
RDW: 17.8 % — ABNORMAL HIGH (ref 11.5–15.5)
WBC: 8.9 10*3/uL (ref 4.0–10.5)

## 2014-01-06 LAB — HEMOGLOBIN A1C
Hgb A1c MFr Bld: 6.8 % — ABNORMAL HIGH (ref <5.7)
Mean Plasma Glucose: 148 mg/dL — ABNORMAL HIGH (ref <117)

## 2014-01-11 MED ORDER — DEXTROSE 5 % IV SOLN
2.0000 g | INTRAVENOUS | Status: AC
  Administered 2014-01-12: 2 g via INTRAVENOUS
  Filled 2014-01-11: qty 2

## 2014-01-12 ENCOUNTER — Ambulatory Visit (HOSPITAL_COMMUNITY): Payer: 59 | Admitting: Anesthesiology

## 2014-01-12 ENCOUNTER — Encounter (HOSPITAL_COMMUNITY): Payer: Self-pay | Admitting: Obstetrics & Gynecology

## 2014-01-12 ENCOUNTER — Encounter (HOSPITAL_COMMUNITY): Payer: 59 | Admitting: Anesthesiology

## 2014-01-12 ENCOUNTER — Encounter (HOSPITAL_COMMUNITY)
Admission: RE | Disposition: A | Discharge status: Home / Self Care | Payer: Self-pay | Source: Ambulatory Visit | Attending: Obstetrics & Gynecology

## 2014-01-12 ENCOUNTER — Ambulatory Visit (HOSPITAL_COMMUNITY)
Admission: RE | Admit: 2014-01-12 | Discharge: 2014-01-13 | Disposition: A | Discharge status: Home / Self Care | Payer: 59 | Source: Ambulatory Visit | Attending: Obstetrics & Gynecology | Admitting: Obstetrics & Gynecology

## 2014-01-12 DIAGNOSIS — N8000 Endometriosis of the uterus, unspecified: Secondary | ICD-10-CM | POA: Diagnosis not present

## 2014-01-12 DIAGNOSIS — Z860100 Personal history of colon polyps, unspecified: Secondary | ICD-10-CM | POA: Insufficient documentation

## 2014-01-12 DIAGNOSIS — N838 Other noninflammatory disorders of ovary, fallopian tube and broad ligament: Secondary | ICD-10-CM | POA: Insufficient documentation

## 2014-01-12 DIAGNOSIS — K219 Gastro-esophageal reflux disease without esophagitis: Secondary | ICD-10-CM | POA: Diagnosis not present

## 2014-01-12 DIAGNOSIS — I10 Essential (primary) hypertension: Secondary | ICD-10-CM | POA: Insufficient documentation

## 2014-01-12 DIAGNOSIS — Z79899 Other long term (current) drug therapy: Secondary | ICD-10-CM | POA: Diagnosis not present

## 2014-01-12 DIAGNOSIS — D259 Leiomyoma of uterus, unspecified: Secondary | ICD-10-CM | POA: Diagnosis not present

## 2014-01-12 DIAGNOSIS — N920 Excessive and frequent menstruation with regular cycle: Secondary | ICD-10-CM | POA: Insufficient documentation

## 2014-01-12 DIAGNOSIS — D500 Iron deficiency anemia secondary to blood loss (chronic): Secondary | ICD-10-CM

## 2014-01-12 DIAGNOSIS — R7303 Prediabetes: Secondary | ICD-10-CM

## 2014-01-12 DIAGNOSIS — E282 Polycystic ovarian syndrome: Secondary | ICD-10-CM | POA: Diagnosis not present

## 2014-01-12 HISTORY — DX: Iron deficiency anemia secondary to blood loss (chronic): D50.0

## 2014-01-12 HISTORY — PX: LAPAROSCOPIC ASSISTED VAGINAL HYSTERECTOMY: SHX5398

## 2014-01-12 LAB — GLUCOSE, CAPILLARY
Glucose-Capillary: 153 mg/dL — ABNORMAL HIGH (ref 70–99)
Glucose-Capillary: 154 mg/dL — ABNORMAL HIGH (ref 70–99)
Glucose-Capillary: 108 mg/dL — ABNORMAL HIGH (ref 70–99)
Glucose-Capillary: 216 mg/dL — ABNORMAL HIGH (ref 70–99)

## 2014-01-12 LAB — HCG, SERUM, QUALITATIVE: Preg, Serum: NEGATIVE

## 2014-01-12 SURGERY — HYSTERECTOMY, VAGINAL, LAPAROSCOPY-ASSISTED
Anesthesia: General | Site: Abdomen | Laterality: Bilateral

## 2014-01-12 MED ORDER — PROPOFOL 10 MG/ML IV EMUL
INTRAVENOUS | Status: AC
  Filled 2014-01-12: qty 20

## 2014-01-12 MED ORDER — MEPERIDINE HCL 25 MG/ML IJ SOLN: 6.2500 mg | INTRAMUSCULAR | Status: DC | PRN

## 2014-01-12 MED ORDER — PHENYLEPHRINE HCL 10 MG/ML IJ SOLN
INTRAMUSCULAR | Status: DC | PRN
  Administered 2014-01-12 (×5): .04 mg via INTRAVENOUS

## 2014-01-12 MED ORDER — ACETAMINOPHEN 325 MG PO TABS
650.0000 mg | ORAL_TABLET | ORAL | Status: DC | PRN
  Administered 2014-01-12: 650 mg via ORAL
  Filled 2014-01-12: qty 2

## 2014-01-12 MED ORDER — METFORMIN HCL 850 MG PO TABS
850.0000 mg | ORAL_TABLET | Freq: Two times a day (BID) | ORAL | Status: DC
  Administered 2014-01-12 – 2014-01-13 (×2): 850 mg via ORAL
  Filled 2014-01-12 (×2): qty 1

## 2014-01-12 MED ORDER — PROMETHAZINE HCL 25 MG/ML IJ SOLN
12.5000 mg | INTRAMUSCULAR | Status: DC | PRN
  Administered 2014-01-12: 12.5 mg via INTRAVENOUS
  Filled 2014-01-12: qty 1

## 2014-01-12 MED ORDER — ROCURONIUM BROMIDE 100 MG/10ML IV SOLN
INTRAVENOUS | Status: AC
  Filled 2014-01-12: qty 1

## 2014-01-12 MED ORDER — FENTANYL CITRATE 0.05 MG/ML IJ SOLN
INTRAMUSCULAR | Status: AC
  Filled 2014-01-12: qty 5

## 2014-01-12 MED ORDER — KETOROLAC TROMETHAMINE 30 MG/ML IJ SOLN: 15.0000 mg | Freq: Once | INTRAMUSCULAR | Status: DC | PRN

## 2014-01-12 MED ORDER — HYDROMORPHONE HCL 1 MG/ML IJ SOLN: 0.2500 mg | INTRAMUSCULAR | Status: DC | PRN

## 2014-01-12 MED ORDER — ONDANSETRON HCL 4 MG/2ML IJ SOLN
INTRAMUSCULAR | Status: AC
  Filled 2014-01-12: qty 2

## 2014-01-12 MED ORDER — MIDAZOLAM HCL 2 MG/2ML IJ SOLN
INTRAMUSCULAR | Status: AC
  Filled 2014-01-12: qty 2

## 2014-01-12 MED ORDER — PANTOPRAZOLE SODIUM 40 MG IV SOLR
40.0000 mg | Freq: Every day | INTRAVENOUS | Status: DC
  Administered 2014-01-12: 40 mg via INTRAVENOUS
  Filled 2014-01-12: qty 40

## 2014-01-12 MED ORDER — GLYCOPYRROLATE 0.2 MG/ML IJ SOLN
INTRAMUSCULAR | Status: DC | PRN
  Administered 2014-01-12: .8 mg via INTRAVENOUS

## 2014-01-12 MED ORDER — NEOSTIGMINE METHYLSULFATE 10 MG/10ML IV SOLN
INTRAVENOUS | Status: AC
  Filled 2014-01-12: qty 1

## 2014-01-12 MED ORDER — LIDOCAINE HCL (CARDIAC) 20 MG/ML IV SOLN
INTRAVENOUS | Status: DC | PRN
  Administered 2014-01-12: 40 mg via INTRAVENOUS
  Administered 2014-01-12: 60 mg via INTRAVENOUS

## 2014-01-12 MED ORDER — DEXAMETHASONE SODIUM PHOSPHATE 10 MG/ML IJ SOLN
INTRAMUSCULAR | Status: AC
  Filled 2014-01-12: qty 1

## 2014-01-12 MED ORDER — GLYCOPYRROLATE 0.2 MG/ML IJ SOLN
INTRAMUSCULAR | Status: AC
  Filled 2014-01-12: qty 4

## 2014-01-12 MED ORDER — SCOPOLAMINE 1 MG/3DAYS TD PT72
MEDICATED_PATCH | TRANSDERMAL | Status: AC
  Administered 2014-01-12: 1.5 mg via TRANSDERMAL
  Filled 2014-01-12: qty 1

## 2014-01-12 MED ORDER — MIDAZOLAM HCL 5 MG/5ML IJ SOLN
INTRAMUSCULAR | Status: DC | PRN
  Administered 2014-01-12: 2 mg via INTRAVENOUS

## 2014-01-12 MED ORDER — ONDANSETRON HCL 4 MG/2ML IJ SOLN
INTRAMUSCULAR | Status: DC | PRN
  Administered 2014-01-12: 4 mg via INTRAVENOUS

## 2014-01-12 MED ORDER — MENTHOL 3 MG MT LOZG: 1.0000 | LOZENGE | OROMUCOSAL | Status: DC | PRN

## 2014-01-12 MED ORDER — LIDOCAINE-EPINEPHRINE 1 %-1:100000 IJ SOLN
INTRAMUSCULAR | Status: AC
  Filled 2014-01-12: qty 1

## 2014-01-12 MED ORDER — NEOSTIGMINE METHYLSULFATE 10 MG/10ML IV SOLN
INTRAVENOUS | Status: DC | PRN
  Administered 2014-01-12: 4 mg via INTRAVENOUS

## 2014-01-12 MED ORDER — MORPHINE SULFATE 4 MG/ML IJ SOLN
2.0000 mg | INTRAMUSCULAR | Status: DC | PRN
Start: 2014-01-12 — End: 2014-01-13

## 2014-01-12 MED ORDER — BUPIVACAINE HCL (PF) 0.25 % IJ SOLN
INTRAMUSCULAR | Status: AC
  Filled 2014-01-12: qty 30

## 2014-01-12 MED ORDER — SCOPOLAMINE 1 MG/3DAYS TD PT72
1.0000 | MEDICATED_PATCH | Freq: Once | TRANSDERMAL | Status: DC
  Administered 2014-01-12: 1.5 mg via TRANSDERMAL

## 2014-01-12 MED ORDER — HYDROMORPHONE HCL 1 MG/ML IJ SOLN
INTRAMUSCULAR | Status: AC
  Filled 2014-01-12: qty 1

## 2014-01-12 MED ORDER — SIMETHICONE 80 MG PO CHEW: 80.0000 mg | CHEWABLE_TABLET | Freq: Four times a day (QID) | ORAL | Status: DC | PRN

## 2014-01-12 MED ORDER — PROPOFOL 10 MG/ML IV BOLUS
INTRAVENOUS | Status: DC | PRN
  Administered 2014-01-12: 200 mg via INTRAVENOUS

## 2014-01-12 MED ORDER — KETOROLAC TROMETHAMINE 30 MG/ML IJ SOLN: 30.0000 mg | Freq: Four times a day (QID) | INTRAMUSCULAR | Status: DC

## 2014-01-12 MED ORDER — KETOROLAC TROMETHAMINE 30 MG/ML IJ SOLN
30.0000 mg | Freq: Four times a day (QID) | INTRAMUSCULAR | Status: DC
  Administered 2014-01-12: 30 mg via INTRAVENOUS
  Filled 2014-01-12: qty 1

## 2014-01-12 MED ORDER — FENTANYL CITRATE 0.05 MG/ML IJ SOLN
INTRAMUSCULAR | Status: DC | PRN
  Administered 2014-01-12: 150 ug via INTRAVENOUS
  Administered 2014-01-12: 100 ug via INTRAVENOUS

## 2014-01-12 MED ORDER — ROCURONIUM BROMIDE 100 MG/10ML IV SOLN
INTRAVENOUS | Status: DC | PRN
  Administered 2014-01-12 (×2): 10 mg via INTRAVENOUS
  Administered 2014-01-12: 45 mg via INTRAVENOUS
  Administered 2014-01-12 (×2): 5 mg via INTRAVENOUS

## 2014-01-12 MED ORDER — LACTATED RINGERS IV SOLN
INTRAVENOUS | Status: DC
  Administered 2014-01-12 (×4): via INTRAVENOUS

## 2014-01-12 MED ORDER — ROPIVACAINE HCL 5 MG/ML IJ SOLN
INTRAMUSCULAR | Status: AC
  Filled 2014-01-12: qty 60

## 2014-01-12 MED ORDER — DEXTROSE-NACL 5-0.45 % IV SOLN
INTRAVENOUS | Status: DC
  Administered 2014-01-12: 16:00:00 via INTRAVENOUS

## 2014-01-12 MED ORDER — OXYCODONE-ACETAMINOPHEN 5-325 MG PO TABS
1.0000 | ORAL_TABLET | ORAL | Status: DC | PRN
  Administered 2014-01-12: 1 via ORAL
  Filled 2014-01-12: qty 1

## 2014-01-12 MED ORDER — EPHEDRINE SULFATE 50 MG/ML IJ SOLN
INTRAMUSCULAR | Status: DC | PRN
  Administered 2014-01-12: 10 mg via INTRAVENOUS
  Administered 2014-01-12 (×2): 5 mg via INTRAVENOUS

## 2014-01-12 MED ORDER — HYDROMORPHONE HCL 1 MG/ML IJ SOLN
INTRAMUSCULAR | Status: DC | PRN
  Administered 2014-01-12: 1 mg via INTRAVENOUS

## 2014-01-12 MED ORDER — BUPIVACAINE HCL (PF) 0.25 % IJ SOLN
INTRAMUSCULAR | Status: DC | PRN
  Administered 2014-01-12: 9 mL

## 2014-01-12 MED ORDER — SODIUM CHLORIDE 0.9 % IJ SOLN
INTRAMUSCULAR | Status: AC
Start: 2014-01-12 — End: 2014-01-12
  Filled 2014-01-12: qty 50

## 2014-01-12 MED ORDER — KETOROLAC TROMETHAMINE 30 MG/ML IJ SOLN
INTRAMUSCULAR | Status: DC | PRN
  Administered 2014-01-12: 30 mg via INTRAVENOUS

## 2014-01-12 MED ORDER — MIDAZOLAM HCL 2 MG/2ML IJ SOLN: 0.5000 mg | Freq: Once | INTRAMUSCULAR | Status: DC | PRN

## 2014-01-12 MED ORDER — EPHEDRINE 5 MG/ML INJ
INTRAVENOUS | Status: AC
  Filled 2014-01-12: qty 10

## 2014-01-12 MED ORDER — LIDOCAINE HCL (CARDIAC) 20 MG/ML IV SOLN
INTRAVENOUS | Status: AC
  Filled 2014-01-12: qty 5

## 2014-01-12 MED ORDER — INSULIN ASPART 100 UNIT/ML ~~LOC~~ SOLN
0.0000 [IU] | SUBCUTANEOUS | Status: DC
Start: 2014-01-12 — End: 2014-01-13
  Administered 2014-01-12: 8 [IU] via SUBCUTANEOUS
  Administered 2014-01-12: 2 [IU] via SUBCUTANEOUS
  Administered 2014-01-13: 8 [IU] via SUBCUTANEOUS

## 2014-01-12 MED ORDER — PROMETHAZINE HCL 25 MG/ML IJ SOLN: 6.2500 mg | INTRAMUSCULAR | Status: DC | PRN

## 2014-01-12 MED ORDER — LIDOCAINE-EPINEPHRINE 1 %-1:100000 IJ SOLN
INTRAMUSCULAR | Status: DC | PRN
  Administered 2014-01-12: 10 mL

## 2014-01-12 MED ORDER — ALUM & MAG HYDROXIDE-SIMETH 200-200-20 MG/5ML PO SUSP: 30.0000 mL | ORAL | Status: DC | PRN

## 2014-01-12 SURGICAL SUPPLY — 55 items
BLADE SURG 10 STRL SS (BLADE) ×3 IMPLANT
BLADE SURG 11 STRL SS (BLADE) ×6 IMPLANT
CABLE HIGH FREQUENCY MONO STRZ (ELECTRODE) IMPLANT
CLOTH BEACON ORANGE TIMEOUT ST (SAFETY) ×3 IMPLANT
CONT PATH 16OZ SNAP LID 3702 (MISCELLANEOUS) ×3 IMPLANT
COVER TABLE BACK 60X90 (DRAPES) ×3 IMPLANT
DECANTER SPIKE VIAL GLASS SM (MISCELLANEOUS) ×3 IMPLANT
DERMABOND ADVANCED (GAUZE/BANDAGES/DRESSINGS) ×2
DERMABOND ADVANCED .7 DNX12 (GAUZE/BANDAGES/DRESSINGS) ×1 IMPLANT
DRSG COVADERM PLUS 2X2 (GAUZE/BANDAGES/DRESSINGS) ×3 IMPLANT
DRSG OPSITE POSTOP 3X4 (GAUZE/BANDAGES/DRESSINGS) ×3 IMPLANT
DURAPREP 26ML APPLICATOR (WOUND CARE) ×3 IMPLANT
ELECT REM PT RETURN 9FT ADLT (ELECTROSURGICAL) ×3
ELECTRODE REM PT RTRN 9FT ADLT (ELECTROSURGICAL) ×1 IMPLANT
ENSEAL DEVICE STD TIP 35CM (ENDOMECHANICALS) IMPLANT
EVACUATOR SMOKE 8.L (FILTER) ×3 IMPLANT
FORCEPS CUTTING 33CM 5MM (CUTTING FORCEPS) ×3 IMPLANT
GLOVE BIOGEL PI IND STRL 7.0 (GLOVE) ×4 IMPLANT
GLOVE BIOGEL PI INDICATOR 7.0 (GLOVE) ×8
GLOVE ECLIPSE 6.5 STRL STRAW (GLOVE) ×6 IMPLANT
GOWN STRL REUS W/ TWL LRG LVL3 (GOWN DISPOSABLE) ×7 IMPLANT
GOWN STRL REUS W/TWL LRG LVL3 (GOWN DISPOSABLE) ×14
NEEDLE INSUFFLATION 120MM (ENDOMECHANICALS) ×3 IMPLANT
NS IRRIG 1000ML POUR BTL (IV SOLUTION) ×3 IMPLANT
OCCLUDER COLPOPNEUMO (BALLOONS) IMPLANT
PACK LAVH (CUSTOM PROCEDURE TRAY) ×3 IMPLANT
PROTECTOR NERVE ULNAR (MISCELLANEOUS) ×3 IMPLANT
SCISSORS LAP 5X35 DISP (ENDOMECHANICALS) IMPLANT
SET IRRIG TUBING LAPAROSCOPIC (IRRIGATION / IRRIGATOR) ×3 IMPLANT
SOLUTION ELECTROLUBE (MISCELLANEOUS) IMPLANT
SUT VIC AB 0 CT1 18XCR BRD8 (SUTURE) ×3 IMPLANT
SUT VIC AB 0 CT1 27 (SUTURE)
SUT VIC AB 0 CT1 27XBRD ANBCTR (SUTURE) IMPLANT
SUT VIC AB 0 CT1 36 (SUTURE) ×6 IMPLANT
SUT VIC AB 0 CT1 8-18 (SUTURE) ×6
SUT VIC AB 2-0 SH 27 (SUTURE)
SUT VIC AB 2-0 SH 27XBRD (SUTURE) IMPLANT
SUT VIC AB 3-0 PS2 18 (SUTURE) ×2
SUT VIC AB 3-0 PS2 18XBRD (SUTURE) ×1 IMPLANT
SUT VICRYL 0 TIES 12 18 (SUTURE) ×3 IMPLANT
SUT VICRYL 0 UR6 27IN ABS (SUTURE) ×3 IMPLANT
SYR 50ML LL SCALE MARK (SYRINGE) ×3 IMPLANT
SYR BULB IRRIGATION 50ML (SYRINGE) ×3 IMPLANT
SYRINGE 10CC LL (SYRINGE) ×3 IMPLANT
TIP UTERINE 5.1X6CM LAV DISP (MISCELLANEOUS) IMPLANT
TIP UTERINE 6.7X10CM GRN DISP (MISCELLANEOUS) ×3 IMPLANT
TIP UTERINE 6.7X6CM WHT DISP (MISCELLANEOUS) IMPLANT
TIP UTERINE 6.7X8CM BLUE DISP (MISCELLANEOUS) IMPLANT
TOWEL OR 17X24 6PK STRL BLUE (TOWEL DISPOSABLE) ×6 IMPLANT
TRAY FOLEY CATH 14FR (SET/KITS/TRAYS/PACK) ×3 IMPLANT
TROCAR BALLN 12MMX100 BLUNT (TROCAR) IMPLANT
TROCAR XCEL NON-BLD 11X100MML (ENDOMECHANICALS) ×3 IMPLANT
TROCAR XCEL NON-BLD 5MMX100MML (ENDOMECHANICALS) ×9 IMPLANT
WARMER LAPAROSCOPE (MISCELLANEOUS) ×3 IMPLANT
WATER STERILE IRR 1000ML POUR (IV SOLUTION) ×3 IMPLANT

## 2014-01-12 NOTE — Anesthesia Preprocedure Evaluation (Addendum)
Anesthesia Evaluation  Patient identified by MRN, date of birth, ID band Patient awake    Reviewed: Allergy & Precautions, H&P , Patient's Chart, lab work & pertinent test results, reviewed documented beta blocker date and time   History of Anesthesia Complications Negative for: history of anesthetic complications  Airway Mallampati: III TM Distance: >3 FB Neck ROM: full    Dental   Pulmonary  breath sounds clear to auscultation        Cardiovascular Exercise Tolerance: Good hypertension, Rhythm:regular Rate:Normal     Neuro/Psych  Headaches,    GI/Hepatic GERD-  Controlled,  Endo/Other  Morbid obesity  Renal/GU      Musculoskeletal   Abdominal   Peds  Hematology   Anesthesia Other Findings   Reproductive/Obstetrics                         Anesthesia Physical Anesthesia Plan  ASA: III  Anesthesia Plan: General ETT   Post-op Pain Management:    Induction:   Airway Management Planned:   Additional Equipment:   Intra-op Plan:   Post-operative Plan:   Informed Consent: I have reviewed the patients History and Physical, chart, labs and discussed the procedure including the risks, benefits and alternatives for the proposed anesthesia with the patient or authorized representative who has indicated his/her understanding and acceptance.   Dental Advisory Given  Plan Discussed with: CRNA and Surgeon  Anesthesia Plan Comments:         Anesthesia Quick Evaluation

## 2014-01-12 NOTE — Anesthesia Procedure Notes (Signed)
Procedure Name: Intubation Date/Time: 01/12/2014 10:07 AM Performed by: Kerby Nora R Pre-anesthesia Checklist: Patient identified, Emergency Drugs available, Suction available, Timeout performed and Patient being monitored Patient Re-evaluated:Patient Re-evaluated prior to inductionOxygen Delivery Method: Circle system utilized Preoxygenation: Pre-oxygenation with 100% oxygen Intubation Type: IV induction Ventilation: Mask ventilation without difficulty Laryngoscope Size: Mac and 3 Grade View: Grade III Tube type: Oral Tube size: 7.0 mm Number of attempts: 1 Airway Equipment and Method: Stylet and Patient positioned with wedge pillow Placement Confirmation: ETT inserted through vocal cords under direct vision,  positive ETCO2 and breath sounds checked- equal and bilateral Secured at: 21 cm Tube secured with: Tape Dental Injury: Teeth and Oropharynx as per pre-operative assessment

## 2014-01-12 NOTE — H&P (Signed)
Sherry Case is an 43 y.o. female G58P3 MWF here for LAVH with bilateral salpingectomy possible BSO due to menorrhagia.  Alternatives have been discussed with pt.  She is ready for definitive therapy and declines other alteratives.  She has had two Mirena IUDs which were expelled spontaneously.  Pre op ultrasound and endometrial biopsy were consistent with adenomyosis.  Risks/benefits have been discussed with pt in-office and she is here and ready to proceed.  Pertinent Gynecological History: Menses: heavy Bleeding: menorrhagia Contraception: vasectomy DES exposure: denies Blood transfusions: none Sexually transmitted diseases: no past history Previous GYN Procedures: none  Last mammogram: normal Date: 06/17/13 Last pap: normal Date: 10/14 OB History: G5, P3   Menstrual History: No LMP recorded. Patient is not currently having periods (Reason: Other).    Past Medical History  Diagnosis Date  . PCOS (polycystic ovarian syndrome)     tx with metformin  . Colon polyps 2014    history  hyperplastic  . SVD (spontaneous vaginal delivery)     x 3  . Hypertension     borderline - no meds  . GERD (gastroesophageal reflux disease)     occasional - no meds - diet controlled  . Headache(784.0)     otc med prn    Past Surgical History  Procedure Laterality Date  . Dilation and curettage of uterus      x2, with SABs  . Breast lumpectomy Right 20 years ago    Benign Enlarged Lymph Node  . Tonsillectomy and adenoidectomy  32 year ago  . Finger surgery  25 years ago    right hand - pointer finger  . Foot surgery  2013    old fracture bone piece removed - right foot  . Hernia repair    . Wisdom tooth extraction    . External cephalic version  7/51/7001    version for SVD w/ epidural    Family History  Problem Relation Age of Onset  . Colon cancer Mother 39    stage 1  . Diabetes Mother   . Lymphoma Paternal Grandmother   . Breast cancer Cousin     Social History:  reports  that she has never smoked. She has never used smokeless tobacco. She reports that she does not drink alcohol or use illicit drugs.  Allergies: No Known Allergies  Prescriptions prior to admission  Medication Sig Dispense Refill  . ibuprofen (ADVIL,MOTRIN) 800 MG tablet Take 1 tablet (800 mg total) by mouth every 8 (eight) hours as needed.  30 tablet  0  . megestrol (MEGACE) 40 MG tablet Take 40 mg by mouth daily. Take by mouth as directed      . meloxicam (MOBIC) 15 MG tablet Take 15 mg by mouth as needed for pain (for back pain).      . metFORMIN (GLUCOPHAGE) 850 MG tablet Take 1 tablet (850 mg total) by mouth 2 (two) times daily with a meal. Start after completing first presciption  180 tablet  3  . Multiple Vitamin (MULTIVITAMIN) tablet Take 1 tablet by mouth daily.      [name] oxyCODONE-acetaminophen (PERCOCET) 5-325 MG per tablet Take 2 tablets by mouth every 4 (four) hours as needed. use only as much as needed to relieve pain  30 tablet  0    Review of Systems  All other systems reviewed and are negative.   Blood pressure 127/99, pulse 98, temperature 98.1 F (36.7 C), temperature source Oral, resp. rate 20, SpO2 100.00%. Physical Exam  Constitutional:  She is oriented to person, place, and time. She appears well-developed and well-nourished.  Cardiovascular: Normal rate and regular rhythm.   Respiratory: Effort normal and breath sounds normal.  GI: Soft. Bowel sounds are normal.  Neurological: She is alert and oriented to person, place, and time.  Skin: Skin is warm and dry.  Psychiatric: She has a normal mood and affect.    Results for orders placed during the hospital encounter of 01/12/14 (from the past 24 hour(s))  HCG, SERUM, QUALITATIVE     Status: None   Collection Time    01/12/14  6:20 AM      Result Value Ref Range   Preg, Serum NEGATIVE  NEGATIVE    No results found.  Assessment/Plan: 43 yo G5P3 MWF here for LAVH/bilateral salpingectomy, possible BSO due to  menorrhagia which is most likely due to adenomyosis.  Pt here and ready to proceed.  [name] 01/12/2014, 7:12 AM

## 2014-01-12 NOTE — Anesthesia Postprocedure Evaluation (Signed)
  Anesthesia Post-op Note  Patient: Sherry Case  Procedure(s) Performed: Procedure(s): LAPAROSCOPIC ASSISTED VAGINAL HYSTERECTOMY with bilateral salpingectomy  (Bilateral) Patient is awake and responsive. Pain and nausea are reasonably well controlled. Vital signs are stable and clinically acceptable. Oxygen saturation is clinically acceptable. There are no apparent anesthetic complications at this time. Patient is ready for discharge.

## 2014-01-12 NOTE — Anesthesia Postprocedure Evaluation (Signed)
  Anesthesia Post-op Note  Patient: Sherry Case  Procedure(s) Performed: Procedure(s): LAPAROSCOPIC ASSISTED VAGINAL HYSTERECTOMY with bilateral salpingectomy  (Bilateral)  Patient Location: [facility]  Anesthesia Type:General  Level of Consciousness: awake, alert  and oriented  Airway and Oxygen Therapy: Patient Spontanous Breathing and Patient connected to nasal cannula oxygen  Post-op Pain: none  Post-op Assessment: Post-op Vital signs reviewed, Patient's Cardiovascular Status Stable, Respiratory Function Stable and No signs of Nausea or vomiting  Post-op Vital Signs: Reviewed and stable  Last Vitals:  Filed Vitals:   01/12/14 1309  BP: 124/80  Pulse: 93  Temp: 36.7 C  Resp:     Complications: No apparent anesthesia complications

## 2014-01-12 NOTE — Progress Notes (Addendum)
Day of Surgery Procedure(s) (LRB): LAPAROSCOPIC ASSISTED VAGINAL HYSTERECTOMY with bilateral salpingectomy  (Bilateral)  Subjective: Patient reports nausea earlier that resolved after one dose of phenergan.  Pt's pain under good control.  Has walked in hall.  Tolerating po's.  Has ordered dinner.  Carb modified diet.  Pt did receive 2 units insulin for mildly elevated blood sugar.  Objective: I have reviewed patient's vital signs, intake and output, medications and labs.  General: alert and cooperative Resp: clear to auscultation bilaterally Cardio: regular rate and rhythm, S1, S2 normal, no murmur, click, rub or gallop GI: soft, non-tender; bowel sounds normal; no masses,  no organomegaly and incision: clean, dry and intact Extremities: extremities normal, atraumatic, no cyanosis or edema Vaginal Bleeding: minimal  Assessment: s/p Procedure(s): LAPAROSCOPIC ASSISTED VAGINAL HYSTERECTOMY with bilateral salpingectomy  (Bilateral): unstable and progressing well  Plan: Advance diet Encourage ambulation Discontinue IV fluids when tolerates regular diet Diabetes educator consult to teach pt to use glucometer and test strips.  I want pt to be able to test blood sugars at home.   LOS: 0 days    Hale Bogus [facility] 01/12/2014, 6:33 PM

## 2014-01-12 NOTE — Op Note (Signed)
01/12/2014  10:49 AM  PATIENT:  Sherry Case  43 y.o. female  PRE-OPERATIVE DIAGNOSIS:  Menorrhagia, probable adenomyosis on ultrasound, failed mirena IUD x 2  POST-OPERATIVE DIAGNOSIS:  Same  PROCEDURE:  Procedure(s): LAPAROSCOPIC ASSISTED VAGINAL HYSTERECTOMY with bilateral salpingectomy, excision of right mesosalpinx mass  SURGEON:  Demont Linford SUZANNE  ASSISTANTS: Josefa Half   ANESTHESIA:   general  ESTIMATED BLOOD LOSS: 200cc  BLOOD ADMINISTERED:none   FLUIDS: 2000cc LR  UOP: 250cc clear  SPECIMEN:  Uterus, cervix, bilateral fallopian tubes, firm/small right mesosalpinx mass  DISPOSITION OF SPECIMEN:  PATHOLOGY  FINDINGS: normal upper abdomen, boggy uterus, normal ovaries, normal appendix, small firm nodule on right mesosalpinx  DESCRIPTION OF OPERATION: Patient is taken to the operating room. She is placed in the supine position. She is a running IV in place. Informed consent was present on the chart. SCDs on her lower extremities and functioning properly. General endotracheal anesthesia was administered by the anesthesia staff without difficulty. Dr. Lyndle Herrlich oversaw case. Once adequate anesthesia was confirmed the legs are placed in the low lithotomy position in [place]. Her arms were tucked by the side.   Dura prep was then used to prep the abdomen and Betadine was used to prep the inner thighs, perineum and vagina. Once 3 minutes had past the patient was draped in a normal standard fashion. The legs were lifted to the high lithotomy position. The cervix was visualized by placing a heavy weighted speculum in the posterior aspect of the vagina and using a curved Deaver retractor to the retract anteriorly. The anterior lip of the cervix was grasped with single-tooth tenaculum. Uterus sounded to 10cm.  A RUMI uterine manipulator was obtained.  A number 10 tip was obtained and attatched to the RUMI uterine manipulator with a medium KOH ring attached.  This all  passed through the cervical canal and into the uterine cavity.  The tip was inflated with 10cc normal saline.  Tenaculum was removed from the cervix and the speculum was removed as well.  A Foley catheter was placed to straight drain. Clear urine was noted. Legs were lowered to the low lithotomy position and attention was turned the abdomen.   0.25% Marcaine was used anesthetize the skin 2 cm below the ribs in the midclavicular line on the pt's left.  Using a 639m non-bladed trochar (Optiview), the trochar/port/laparoscope was passed without difficulty.  Pneumoperitoneum was achieved without difficulty. Once 3.5 liters of gas was in the abdomen and good abdominal distention was noted, a midline port was placed.  Skin was anesthetized with 0.25% Marcaine and 133mskin incision was made with #11 blade.   Locations for LLQ and RLQ 39m34morts were chosen with transillumination of the skin. The skin was anesthetized with 0.25% Marcaine. 39mm28min incisions were made and 39mm 72mbladed trocars and ports were passed directly into the abdomen. The trochars were removed. The upper abdomen was surveyed. Liver, gall bladder, and stomach edge appeared normal. The patient was placed in Trendelenburg. The pelvix was surveyed. The ovaries were normal.  Fallopian tubes were normal.  There was a small, firm nodule on the mesosalpinx on the right.  Uterus appeared boggy and slightly enlarged.  No adhesions were present.     Attention was turned to the left side. With uterus on stretch the left fallopian tube was excised off the ovary.  Then with the Gyrus, the utero-ovarian pedicle was clamped, cauterized and incised.  Then the left round ligament was serially clamped cauterized  and incised. The anterior and posterior peritoneum of the inferior leaf of the broad ligament were opened. The posterior leaf was taken down to the uterosacral ligament and the anterior peritoneum was opened across the midline.   Attention was turned the  right side and in a similar fashion, the right fallopian tubes was excised off the right ovary.  There was a small, firm nodule on the right mesosalpinx.  This was excised and sent to pathology.  Then the right utero-ovarian pedicle was clamped, cauterized and incised.  Then the left round ligament was serially clamped, cauterized, and incised. The anterior and posterior peritoneum were opened and the posterior peritoneum was taken down to the level of the left uterosacral ligament. The anterior peritoneum was opened and connected to the dissection done on the left.  Care was taken throughout the laparoscopic portion to always be aware of bowel location and ureteral location.  At this point, decision was made to proceed vaginally. All pedicles were hemostatic. The laparoscopic instruments were removed.  No bleeding was noted. Patient was taken out of Trendelenburg. The pneumoperitoneum was relieved.   Attention was turned below. Legs were elevated to the high lithotomy position. A heavy weighted speculum was placed in the posterior vagina. Cervix was injected with 1% Lidocaine mixed 1:1 with Epinephrine (1:100,000U). The posterior cul de sac was entered sharply and a suture was placed across the posterior vaginal mucosa and peritoneum. Heaney clamp was used to clamp across uterosacral ligaments on each side. #0 Vicryl Heaney stitches were placed and the stitches were left long. Then using a knife an incision was made from the 9:00 position on the cervix across to the 3:00 position. This was kept low on the cervix.  This incision was continued with curved Mayo scissors by cutting at a right angle to the incision and then parallel with the cervix.  An open Ray-Tec was used to advance the dissection in the pubovesicocervical plane. Then the cardinal ligaments were serially clamped, incised and suture ligated with #0 Vicryl. This was performed on alternate sides until the dissection anteriorly med with the  dissection done laparoscopically.  A curved Deaver retractor was then placed in this incision to retract the bladder anteriorly.   Next the uterine arteries on each side were clamped cut and doubly suture-ligated with #0 Vicryl. The remainder of the pedicles above the uterine arteries and beneath the incision done laparoscopically were then serially clamped cut and incised until the dissection done laparoscopically was met. At this point specimen was delivered through the vagina. The cervix, uterus, bilateral tubes handed off to be sent to pathology. Pedicles were inspected this point. No bleeding was noted. The cuff was run from 2 o'clock to 10 o'clock incorporating the anterior vaginal mucosa and anterior peritoneum into the stitch as well as the posterior vaginal mucosa and the posterior peritoneum.  A running interlocking suture of #2-0 Vicryl was used.  Then a uterosacral ligament stitch was placed.  Starting across the posterior vaginal mucosa, the medial third of the right uterosacral ligament was incorporated into the stitch, the posterior vaginal mucosa was reefed across, and the medial third of the left uterosacral ligament was incorporated into the stitch.  This was tied tightly bringing the posterior vaginal together to the uterosacral ligaments.  The vaginal cuff was then closed with figure of eight sutures of #0 Vicryl.  Vaginal was irrigated.  No bleeding noted.  Legs were lowered to the low lithotomy position and attention was turned back  to the abdomen.  The pneumoperitoneum was obtained again.  Cuff was well visualized.  Using a  suction irrigator the cuff and pedicle were inspected.  No bleeding was noted.  All pedicle appears to be hemostatic.  The cuff was visualized with decreased pneumoperitoneum pressure.  Again, excellent hemostasis was noted.  At this point the procedure was ended.  Pt was positioned back out of Trendelenburg.  Instruments were removed.  Ports were removed under direct  visualization of the laparoscope.  Finally the scope was removed.  Pneumoperitoneum was relieved.  The CRNA gave the pt several deep breaths to try and get all gas out of the abdomen.  The midline port was removed and closed at the fascial level with a figure of eight suture of #0 Vicryl. The skin was closed with a subcuticular stitch of #3.0 Vicryl as all sites. Dermabond was applied to each incision.  Sponge, lap, needle, and instrument counts were correct x 2.  Pt tolerated the procedure well.  She was positioned back into the supine position.  She was awakened from anesthesia, extubated, and taken to the recovery room in stable condition.  COUNTS:  YES  PLAN OF CARE: Transfer to PACU

## 2014-01-12 NOTE — Addendum Note (Signed)
Addendum created 01/12/14 1712 by Jonna Munro, CRNA   Modules edited: Notes Section   Notes Section:  File: [id number]

## 2014-01-12 NOTE — Transfer of Care (Signed)
Immediate Anesthesia Transfer of Care Note  Patient: Sherry Case  Procedure(s) Performed: Procedure(s): LAPAROSCOPIC ASSISTED VAGINAL HYSTERECTOMY with bilateral salpingectomy  (Bilateral)  Patient Location: PACU  Anesthesia Type:General  Level of Consciousness: awake, oriented and sedated  Airway & Oxygen Therapy: Patient Spontanous Breathing and Patient connected to nasal cannula oxygen  Post-op Assessment: Report given to PACU RN and Post -op Vital signs reviewed and stable  Post vital signs: Reviewed and stable  Complications: No apparent anesthesia complications

## 2014-01-13 ENCOUNTER — Encounter (HOSPITAL_COMMUNITY): Payer: Self-pay | Admitting: Obstetrics & Gynecology

## 2014-01-13 DIAGNOSIS — N920 Excessive and frequent menstruation with regular cycle: Secondary | ICD-10-CM | POA: Diagnosis not present

## 2014-01-13 LAB — BASIC METABOLIC PANEL
Anion gap: 10 (ref 5–15)
BUN: 9 mg/dL (ref 6–23)
CO2: 27 mEq/L (ref 19–32)
CREATININE: 0.85 mg/dL (ref 0.50–1.10)
Calcium: 8.8 mg/dL (ref 8.4–10.5)
Chloride: 103 mEq/L (ref 96–112)
GFR calc Af Amer: >90 mL/min (ref >90)
GFR, EST NON AFRICAN AMERICAN: 83 mL/min — AB (ref >90)
Glucose, Bld: 133 mg/dL — ABNORMAL HIGH (ref 70–99)
Potassium: 4.3 mEq/L (ref 3.7–5.3)
Sodium: 140 mEq/L (ref 137–147)

## 2014-01-13 LAB — CBC
HCT: 34 % — ABNORMAL LOW (ref 36.0–46.0)
Hemoglobin: 10.4 g/dL — ABNORMAL LOW (ref 12.0–15.0)
MCH: 24.7 pg — AB (ref 26.0–34.0)
MCHC: 30.6 g/dL (ref 30.0–36.0)
MCV: 80.8 fL (ref 78.0–100.0)
PLATELETS: 311 10*3/uL (ref 150–400)
RBC: 4.21 MIL/uL (ref 3.87–5.11)
RDW: 18.4 % — ABNORMAL HIGH (ref 11.5–15.5)
WBC: 12.9 10*3/uL — ABNORMAL HIGH (ref 4.0–10.5)

## 2014-01-13 LAB — GLUCOSE, CAPILLARY
GLUCOSE-CAPILLARY: 116 mg/dL — AB (ref 70–99)
Glucose-Capillary: 101 mg/dL — ABNORMAL HIGH (ref 70–99)
Glucose-Capillary: 120 mg/dL — ABNORMAL HIGH (ref 70–99)
Glucose-Capillary: 127 mg/dL — ABNORMAL HIGH (ref 70–99)

## 2014-01-13 MED ORDER — LIVING WELL WITH DIABETES BOOK
Freq: Once | Status: AC
  Administered 2014-01-13: 13:00:00
  Filled 2014-01-13: qty 1

## 2014-01-13 NOTE — Discharge Instructions (Signed)

## 2014-01-13 NOTE — Progress Notes (Signed)
Pt ambulated out teaching complete  Seen by dietician and diabetic  Nurse and case management

## 2014-01-13 NOTE — Progress Notes (Signed)
Spoke with patient about new diabetes diagnosis. Currently patient does not have a PCP but Dr. [name] Heck is planning to set her up with a PCP in [place] area. Discussed A1C results (6.8% on 01/06/14) and explained what an A1C is, basic pathophysiology of DM Type 2, basic home care, importance of checking CBGs and maintaining good CBG control to prevent long-term and short-term complications. Patient reports that her mother has diabetes and she has watched her check her glucose several times but has never actually checked her mother's glucose. Discussed normal glucose values (80 mg/dl to 130 mg/dl) and have asked patient to check her glucose 4 times per day and to keep a log book to take with her to her follow up visits with new PCP. Asked that patient call Dr. [name] Heck if her glucose runs greater than 200 mg/dl. Discussed impact of nutrition, exercise, stress, sickness, and medications on diabetes control.  Discussed carbohydrates, carbohydrate goals per day and meal, along with portion sizes.  RNs to provide ongoing basic DM education at bedside with this patient and engage patient to actively check blood glucose. Have ordered educational booklet,RD consult, and outpatient diabetes education at [place].  Patient verbalized understanding of information discussed and she states that she has no further questions at this time related to diabetes.   Thanks, Barnie Alderman, RN, MSN, CCRN Diabetes Coordinator Inpatient Diabetes Program [phone] (Team Pager) [phone] ([facility] office) [phone] [facility] office)

## 2014-01-13 NOTE — Discharge Summary (Signed)
Physician Discharge Summary  Patient ID: Sherry Case MRN: [id number] DOB/AGE: 1971/02/07 43 y.o.  Admit date: 01/12/2014 Discharge date: 01/13/2014  Admission Diagnoses:  Discharge Diagnoses:  Active Problems:   Menorrhagia   Anemia due to chronic blood loss   Discharged Condition: good  Hospital Course: Patient admitted through same day surgery.  She was taken to OR where LAVH/bilateral salpingectomy was performed.  Surgical findings included enlarged and bulky uterus.  Surgery was uneventful.  EBL 200cc.   Patient transferred to PACU where she was stable and then to 3rd floor for the remainder of her hospitalization.  During her post-op recovery, her vitals and stable and she was AF.  In evening of POD#0, she was able to transition to oral pain medications and regular diet.  Blood sugars were monitored and diabetic consultation was made for teaching and education.  She was able to ambulate and she had good pain control.  She was also able to void on her own.  Patient seen both in the evening of POD#0 and AM of POD#1.  In the AM of POD#1, she was without complaint.  Post op hb was 10.4, decreased from 11.5, pre-operatively.  At this point, patient was voiding, walking, having excellent pain control, had no nausea, and minimal vaginal bleeding.  She was ready for D/C.   Consults: Diabetic educator  Significant Diagnostic Studies: labs: post op hb 10.4  Treatments: surgery: LAVH/bilateral salpingectomy  Discharge Exam: Blood pressure 128/75, pulse 96, temperature 98.2 F (36.8 C), temperature source Oral, resp. rate 20, SpO2 96.00%. General appearance: alert and cooperative Resp: clear to auscultation bilaterally and normal percussion bilaterally Cardio: regular rate and rhythm, S1, S2 normal, no murmur, click, rub or gallop GI: soft, non-tender; bowel sounds normal; no masses,  no organomegaly Incision/Wound:c/d/i  Disposition: 01-Home or Self Care  Discharge Instructions   Ambulatory referral to Nutrition and Diabetic Education    Complete by:  As directed   New onset DM; A1C 6.8% on 01/06/14. Will discharge from [facility] today with Metformin 850 mg BID and has been instructed to check CBGs 4 times per day.            Medication List    STOP taking these medications       megestrol 40 MG tablet  Commonly known as:  MEGACE      TAKE these medications       ibuprofen 800 MG tablet  Commonly known as:  ADVIL,MOTRIN  Take 1 tablet (800 mg total) by mouth every 8 (eight) hours as needed.     meloxicam 15 MG tablet  Commonly known as:  MOBIC  Take 15 mg by mouth as needed for pain (for back pain).     metFORMIN 850 MG tablet  Commonly known as:  GLUCOPHAGE  Take 1 tablet (850 mg total) by mouth 2 (two) times daily with a meal. Start after completing first presciption     multivitamin tablet  Take 1 tablet by mouth daily.     oxyCODONE-acetaminophen 5-325 MG per tablet  Commonly known as:  PERCOCET  Take 2 tablets by mouth every 4 (four) hours as needed. use only as much as needed to relieve pain           Follow-up Information   Follow up with Lyman Speller, MD On 01/29/2014. (10am appt time)    Specialty:  Gynecology   Contact information:   [address] [place] [id number] [phone]  SignedLyman Speller 01/13/2014, 1:12 PM

## 2014-01-13 NOTE — Progress Notes (Signed)
  RD consulted for nutrition education regarding diabetes.   Lab Results  Component Value Date   HGBA1C 6.8* 01/06/2014    RD provided "Carbohydrate Counting for People with Diabetes" handout from the Academy of Nutrition and Dietetics. Discussed different food groups and their effects on blood sugar, emphasizing carbohydrate-containing foods. Provided list of carbohydrates and recommended serving sizes of common foods.  Pt demonstrated basic knowledge of diet, CHO counting, label reading. Has Hx of borderline GDM with preg, per pt. Pt has apt at [facility] this [place] M.Fredderick Severance LDN Neonatal Nutrition Support Specialist/RD III Pager [phone]

## 2014-01-13 NOTE — Care Management Note (Signed)
    Page 1 of 1   01/13/2014     12:36:02 PM CARE MANAGEMENT NOTE 01/13/2014  Patient:  Sherry Case,Sherry Case   Account Number:  [id number]  Date Initiated:  01/13/2014  Documentation initiated by:  Dicie Beam  Subjective/Objective Assessment:   Elevated glucose.     Action/Plan:   Check benefits for glucose meter   Anticipated DC Date:  01/13/2014   Anticipated DC Plan:  [place]  CM consult      [facility]  DURABLE MEDICAL EQUIPMENT   Discharge Disposition:  Home   Comments:  01/13/14  1230p  Received call from [facility] and the pt has some questions.  Spoke w/ pt and husband at bedisde. Pt stated that [facility] could send her a glucose meter within 7-10 days or she could check w/ her pharmacy to see if they carry the meter that is covered.  The pt use's [facility] in [place].  CM suggested that pt call [facility] and speak w/ pharmacy team member to check coverage and to see if they will bill the insurance co.  Rx for glucose meter/lancets/strips are on the pts chart.  [place] [phone]   01/13/14  1100a  Per benefits check by CMA and per [employer]: FOR THE METER THE PT. WILL NEED TO CALL: [phone]. NO PRECERT REQUIRED. [facility]. 90 day supply for lancets $26.26, 90 day supply strips : 72.15,novolog:VALVE  $136.17 , PEN $197.27. CM gave this information to the pt for follow up.  CM available to assist as needed.  [place], [place]

## 2014-01-14 ENCOUNTER — Ambulatory Visit: Payer: 59 | Admitting: Obstetrics & Gynecology

## 2014-01-16 ENCOUNTER — Encounter: Payer: Self-pay | Admitting: *Deleted

## 2014-01-16 ENCOUNTER — Encounter: Payer: 59 | Attending: Obstetrics & Gynecology | Admitting: *Deleted

## 2014-01-16 ENCOUNTER — Telehealth: Payer: Self-pay | Admitting: Emergency Medicine

## 2014-01-16 VITALS — Ht 63.0 in | Wt 218.6 lb

## 2014-01-16 DIAGNOSIS — R7309 Other abnormal glucose: Secondary | ICD-10-CM | POA: Insufficient documentation

## 2014-01-16 DIAGNOSIS — R7303 Prediabetes: Secondary | ICD-10-CM

## 2014-01-16 DIAGNOSIS — Z713 Dietary counseling and surveillance: Secondary | ICD-10-CM | POA: Insufficient documentation

## 2014-01-16 NOTE — Telephone Encounter (Signed)
Called [facility] in [place].  They are requesting that patient come into office with her photo ID and insurance card to sign release forms.  They request that patient sign release so that 5 years of her records can be obtained from any provider, ER, urgent care, specialty.   Spoke with patient and she is aware of this policy. She will go to [facility] and sign records release. Patient feels she does not have records as she has moved many times.  Advised patient if has any issues with obtaining appointment, we can refer for primary care elsewhere. She will call back prn.

## 2014-01-16 NOTE — Progress Notes (Signed)
Medical Nutrition Therapy:  Appt start time: 0800 end time:  0900.  Assessment:  Patient here today for pre-diabetes. She has been on metformin for years due to PCOS, but was found to have an A1c of 6.8 when she was in the hospital for hysterectomy. She checks her blood glucose 4 times daily, before meals and at bedtime. She has been following a 30 g carb per meal diet. She has also been working to reduce intake of soda. Patient unable to exercise much for the last year due to fatigue related to heavy menstruation, but anticipates starting to walk again once able.   Fasting BG: <123 Before meals: <110 Bedtime: 120  MEDICATIONS: Metformin 850 mg BID   DIETARY INTAKE:   Usual eating pattern includes 3 meals and 1-2 snacks per day.  24-hr recall:  B ( AM): Bagel, yogurt OR hard-boiled eggs, water  Snk ( AM): None  L ( PM): Egg-salad on crackers OR salad (grilled pork chop), water Snk (3 PM): Raw vegetables (carrots, broccoli), Cheez-its, Wheat thins D ( PM): Chicken quesadillas with corn salsa, black beans OR spaghetti OR hamburger/grilled chicken, vegetable, water Snk ( PM): Occasionally popcorn, chips/salsa Beverages: Water, Dr. Malachi Bonds at lunch occasionally  Usual physical activity: None lately due to fatigue, likes walking on the treadmill 4 days a week  Estimated energy needs: 1500 calories 188 g carbohydrates 94 g protein 42 g fat  Progress Towards Goal(s):  In progress.   Nutritional Diagnosis:  NB-1.1 Food and nutrition-related knowledge deficit As related to pre-diabetes.  As evidenced by A1c 6.8.    Intervention:  Nutrition counseling. We discussed basic carb counting, including foods with carbs, label reading, portion size, and meal planning.   Goals:  1. 2-3 carb servings at meals (30-45 grams), 1 serving per snack (15-20 grams) 2. Monitor portion size of carb foods.  3. Read food label.  4. Use plate method for meal planning 5. When able, begin walking at least 20-30  minutes 3-4 days weekly  Handouts given during visit include:  Carb counting handout  Meal plan card  Monitoring/Evaluation:  Dietary intake, exercise, blood glucose, and body weight prn.

## 2014-01-16 NOTE — Telephone Encounter (Signed)
Message copied by Michele Mcalpine on Fri Jan 16, 2014 11:52 AM ------      Message from: Megan Salon      Created: Wed Jan 14, 2014  1:54 PM      Regarding: referral appt       Pt needs new pt appt at [facility] in [place] for diabetes.  Any day or time is fine.  Any MD.  Thanks. ------

## 2014-01-19 ENCOUNTER — Ambulatory Visit: Payer: 59 | Admitting: Obstetrics & Gynecology

## 2014-01-29 ENCOUNTER — Ambulatory Visit (INDEPENDENT_AMBULATORY_CARE_PROVIDER_SITE_OTHER): Payer: 59 | Admitting: Obstetrics & Gynecology

## 2014-01-29 ENCOUNTER — Encounter: Payer: Self-pay | Admitting: Obstetrics & Gynecology

## 2014-01-29 ENCOUNTER — Telehealth: Payer: Self-pay | Admitting: Obstetrics & Gynecology

## 2014-01-29 VITALS — BP 130/88 | HR 64 | Resp 16 | Wt 219.4 lb

## 2014-01-29 DIAGNOSIS — Z9889 Other specified postprocedural states: Secondary | ICD-10-CM

## 2014-01-29 NOTE — Progress Notes (Signed)
Post Operative Visit  Procedure:LAVH with bilateral salpingectomy Days Post-op: 18  Subjective: Doing well.  Minimal discharge.  Having normal bowel movements and emptying bladder well.  Took one post operative narcotic.  Minimal vaginal bleeding.  Hasn't been able to have appt at [facility] in [place] as they need her to have at least five years of records.  Checking BS at home.  Mostly in 110 range.  Highest 138.    Objective: BP 130/88  Pulse 64  Resp 16  Wt 219 lb 6.4 oz (99.519 kg)  LMP 09/05/2013  EXAM General: alert and cooperative Resp: clear to auscultation bilaterally Cardio: regular rate and rhythm, S1, S2 normal, no murmur, click, rub or gallop GI: soft, non-tender; bowel sounds normal; no masses,  no organomegaly and incision: clean, dry and intact Extremities: extremities normal, atraumatic, no cyanosis or edema Vaginal Bleeding: minimal, vaginal cuff still with sutures, no erythema, minimal drainage  Assessment: s/p LAVH/bilateral salpingectomy  Plan: Recheck 3 weeks Will refer to PCP for Diabetes

## 2014-01-29 NOTE — Telephone Encounter (Signed)
Dr. [name] Heck,  Patient is scheduled for new patient appointment at [facility] with Roxy Cedar, NP for 02/26/14 at 1615. Patient is aware.   Patient needs 3 week recheck and cannot accept appointments offered: 10/23 at 1500, 10/26 at 1500. Advised will review schedule and return her call with appointment.

## 2014-01-29 NOTE — Telephone Encounter (Signed)
Ok to look at a 4 week appt as well.

## 2014-01-29 NOTE — Telephone Encounter (Signed)
Patient needs a 3 week reck with Dr.Miller. No appointments available. (patient is not available 02/16/14)

## 2014-02-02 NOTE — Telephone Encounter (Signed)
Called patient. She is scheduled for follow up exam on 02/20/14 at 1445. She may need to reschedule but will call back if need to.  Routing to provider for final review. Patient agreeable to disposition. Will close encounter

## 2014-02-12 ENCOUNTER — Ambulatory Visit: Payer: 59 | Admitting: Obstetrics & Gynecology

## 2014-02-20 ENCOUNTER — Encounter: Payer: Self-pay | Admitting: Obstetrics & Gynecology

## 2014-02-20 ENCOUNTER — Ambulatory Visit (INDEPENDENT_AMBULATORY_CARE_PROVIDER_SITE_OTHER): Payer: 59 | Admitting: Obstetrics & Gynecology

## 2014-02-20 VITALS — BP 148/96 | HR 64 | Resp 16 | Wt 221.8 lb

## 2014-02-20 DIAGNOSIS — Z9889 Other specified postprocedural states: Secondary | ICD-10-CM

## 2014-02-20 NOTE — Progress Notes (Signed)
Post Operative Visit  Procedure:LAVH with bilateral salpingectomy Days Post-op: 40 days  Subjective: Doing well.  No vaginal bleeding.  Not on any pain medications.  Bowel movements/bladder function is normal.  Energy is low but this is not new.  Seeing new PCP regarding diabetes in December.    Objective: BP 148/96  Pulse 64  Resp 16  Wt 221 lb 12.8 oz (100.608 kg)  LMP 09/05/2013  EXAM General: alert and cooperative GI: soft, non-tender; bowel sounds normal; no masses,  no organomegaly Extremities: extremities normal, atraumatic, no cyanosis or edema Vaginal Bleeding: none, sutures still present  Assessment: s/p LAVH/bilateral salpingectomy  Plan: Recheck 1 year for AEX

## 2014-02-23 ENCOUNTER — Encounter: Payer: Self-pay | Admitting: Obstetrics & Gynecology

## 2014-02-26 ENCOUNTER — Ambulatory Visit: Payer: 59 | Admitting: Family

## 2014-03-03 ENCOUNTER — Ambulatory Visit (INDEPENDENT_AMBULATORY_CARE_PROVIDER_SITE_OTHER): Payer: 59 | Admitting: Podiatry

## 2014-03-03 ENCOUNTER — Ambulatory Visit (INDEPENDENT_AMBULATORY_CARE_PROVIDER_SITE_OTHER): Payer: 59

## 2014-03-03 ENCOUNTER — Encounter: Payer: Self-pay | Admitting: Podiatry

## 2014-03-03 VITALS — BP 142/94 | HR 108 | Resp 16

## 2014-03-03 DIAGNOSIS — M722 Plantar fascial fibromatosis: Secondary | ICD-10-CM

## 2014-03-03 MED ORDER — TRIAMCINOLONE ACETONIDE 10 MG/ML IJ SUSP
10.0000 mg | Freq: Once | INTRAMUSCULAR | Status: AC
  Administered 2014-03-03: 10 mg

## 2014-03-03 MED ORDER — DICLOFENAC SODIUM 75 MG PO TBEC: 75.0000 mg | DELAYED_RELEASE_TABLET | Freq: Two times a day (BID) | ORAL | Status: DC

## 2014-03-03 NOTE — Patient Instructions (Signed)

## 2014-03-03 NOTE — Progress Notes (Signed)
   Subjective:    Patient ID: Sherry Case, female    DOB: [birth year], 43 y.o.   MRN: [id number]  HPI Comments: "I have problems with this heel"  Patient c/o aching plantar heel left for a few months. She does have AM pain. She takes meloxicam as needed for back.   Also, she had a callused area medial heel left which she thinks is a wart. Gets tender.  Foot Pain Associated symptoms include fatigue and headaches.      Review of Systems  Constitutional: Positive for fatigue.  Gastrointestinal: Positive for abdominal distention.  Musculoskeletal: Positive for back pain.  Neurological: Positive for headaches.  All other systems reviewed and are negative.      Objective:   Physical Exam        Assessment & Plan:

## 2014-03-03 NOTE — Progress Notes (Signed)
Subjective:     Patient ID: Sherry Case, female   DOB: 10-Mar-1971, 43 y.o.   MRN: [id number]  HPIpatient presents stating she is having a lot of pain in the plantar aspect of her left heel for approximately 3-4 months and it's been gradually becoming more intense over that time. Worse when getting up in the morning or after sitting   Review of Systems  All other systems reviewed and are negative.      Objective:   Physical Exam  Constitutional: She is oriented to person, place, and time.  Cardiovascular: Intact distal pulses.   Musculoskeletal: Normal range of motion.  Neurological: She is oriented to person, place, and time.  Skin: Skin is warm.  Nursing note and vitals reviewed. neurovascular status intact with muscle strength adequate and range of motion of the subtalar and midtarsal joint within normal limits. Patient is noted to have well-perfused toes is well oriented 3 with no equinus condition noted. I noted there to be exquisite discomfort in the plantar aspect of the left heel at the insertional point of the tendon into the calcaneus with a flatfoot deformity also noted upon gait analysis     Assessment:     Plantar fasciitis left with inflammation and structural changes which are part of the symptoms she is experiencing    Plan:     H&P and x-rays reviewed and today I injected the plantar fascia 3 mg Kenalog 5 mg Xylocaine and then applied a fascially brace with instructions and placed patient on diclofenac 75 mg twice a day and gave instructions on physical therapy. Reappoint in 1 week

## 2014-03-11 ENCOUNTER — Ambulatory Visit (INDEPENDENT_AMBULATORY_CARE_PROVIDER_SITE_OTHER): Payer: 59 | Admitting: Podiatry

## 2014-03-11 ENCOUNTER — Encounter: Payer: Self-pay | Admitting: Podiatry

## 2014-03-11 VITALS — BP 138/85 | HR 90 | Resp 16

## 2014-03-11 DIAGNOSIS — M722 Plantar fascial fibromatosis: Secondary | ICD-10-CM

## 2014-03-11 MED ORDER — TRIAMCINOLONE ACETONIDE 10 MG/ML IJ SUSP
10.0000 mg | Freq: Once | INTRAMUSCULAR | Status: AC
  Administered 2014-03-11: 10 mg

## 2014-03-11 NOTE — Progress Notes (Signed)
Subjective:     Patient ID: Sherry Case, female   DOB: Apr 13, 1971, 43 y.o.   MRN: [id number]  HPIpatient states my heel is still bothering me quite a bit but improved from previous visit   Review of Systems     Objective:   Physical Exam Neurovascular status intact with muscle strength adequate in range of motion within normal limits and noted to have discomfort plantar aspect left heel that's painful upon palpation    Assessment:     Continued plantar fasciitis left that's improved but is still present    Plan:     Advised on physical therapy and reinjected the plantar fascia 3 mg Kenalog 5 mg Xylocaine and went ahead and scanned for custom orthotics do to long-term nature of condition and structural changes within both feet. Reappoint when orthotics are returned

## 2014-03-27 ENCOUNTER — Encounter: Payer: Self-pay | Admitting: Family

## 2014-03-27 ENCOUNTER — Ambulatory Visit (INDEPENDENT_AMBULATORY_CARE_PROVIDER_SITE_OTHER): Payer: 59 | Admitting: Family

## 2014-03-27 VITALS — BP 138/88 | HR 84 | Ht 63.0 in | Wt 222.0 lb

## 2014-03-27 DIAGNOSIS — E282 Polycystic ovarian syndrome: Secondary | ICD-10-CM

## 2014-03-27 DIAGNOSIS — R5382 Chronic fatigue, unspecified: Secondary | ICD-10-CM

## 2014-03-27 DIAGNOSIS — E119 Type 2 diabetes mellitus without complications: Secondary | ICD-10-CM

## 2014-03-27 DIAGNOSIS — I10 Essential (primary) hypertension: Secondary | ICD-10-CM

## 2014-03-27 NOTE — Progress Notes (Signed)
Pre visit review using our clinic review tool, if applicable. No additional management support is needed unless otherwise documented below in the visit note. 

## 2014-03-27 NOTE — Progress Notes (Signed)
Subjective:    Patient ID: Sherry Case, female    DOB: March 24, 1971, 43 y.o.   MRN: [id number]  HPI 43 year old white female, new patient to the practice is in to be established. Has a history of menorrhagia and is status post total abdominal hysterectomy. She has a history of polycystic ovarian syndrome, type 2 diabetes currently on metformin 850 mg twice a day. Last mammogram in February 2015. Has a family history of colon cancer and is due for colonoscopy screening in 2017. Had a normal colonoscopy. Under the care podiatry for heel spurs and takes meloxicam and tolerates it well.   Review of Systems  Constitutional: Positive for fatigue.  HENT: Negative.   Respiratory: Negative.   Cardiovascular: Negative.   Gastrointestinal: Negative.   Endocrine: Negative.   Genitourinary: Negative.        Status post hysterectomy  Musculoskeletal: Negative.   Skin: Negative.   Neurological: Negative.   Hematological: Negative.   Psychiatric/Behavioral: Negative.    Past Medical History  Diagnosis Date  . PCOS (polycystic ovarian syndrome)     tx with metformin  . Colon polyps 2014    history  hyperplastic  . SVD (spontaneous vaginal delivery)     x 3  . Hypertension     borderline - no meds  . GERD (gastroesophageal reflux disease)     occasional - no meds - diet controlled  . Headache(784.0)     otc med prn  . Anemia due to chronic blood loss 01/12/2014    History   Social History  . Marital Status: Married    Spouse Name: N/A    Number of Children: N/A  . Years of Education: N/A   Occupational History  . Not on file.   Social History Main Topics  . Smoking status: Never Smoker   . Smokeless tobacco: Never Used  . Alcohol Use: No  . Drug Use: No  . Sexual Activity: Yes    Birth Control/ Protection: Other-see comments     Comment: Withdrawal Method   Other Topics Concern  . Not on file   Social History Narrative    Past Surgical History  Procedure Laterality  Date  . Dilation and curettage of uterus      x2, with SABs  . Breast lumpectomy Right 20 years ago    Benign Enlarged Lymph Node  . Tonsillectomy and adenoidectomy  32 year ago  . Finger surgery  25 years ago    right hand - pointer finger  . Foot surgery  2013    old fracture bone piece removed - right foot  . Hernia repair    . Wisdom tooth extraction    . External cephalic version  6/72/0947    version for SVD w/ epidural  . Laparoscopic assisted vaginal hysterectomy Bilateral 01/12/2014    Procedure: LAPAROSCOPIC ASSISTED VAGINAL HYSTERECTOMY with bilateral salpingectomy ;  Surgeon: Lyman Speller, MD;  Location: [facility];  Service: Gynecology;  Laterality: Bilateral;    Family History  Problem Relation Age of Onset  . Colon cancer Mother 21    stage 1  . Diabetes Mother   . Lymphoma Paternal Grandmother   . Breast cancer Cousin     No Known Allergies  Current Outpatient Prescriptions on File Prior to Visit  Medication Sig Dispense Refill  . diclofenac (VOLTAREN) 75 MG EC tablet Take 1 tablet (75 mg total) by mouth 2 (two) times daily. 50 tablet 2  . metFORMIN (GLUCOPHAGE) 850 MG tablet  Take 1 tablet (850 mg total) by mouth 2 (two) times daily with a meal. Start after completing first presciption 180 tablet 3  . Multiple Vitamin (MULTIVITAMIN) tablet Take 1 tablet by mouth daily.    . ONE TOUCH ULTRA TEST test strip     . ONETOUCH DELICA LANCETS 33X MISC      No current facility-administered medications on file prior to visit.    BP 138/88 mmHg  Pulse 84  Ht 5\' 3"  (1.6 m)  Wt 222 lb (100.699 kg)  BMI 39.34 kg/m2  LMP 05/15/2015chart    Objective:   Physical Exam  Constitutional: She is oriented to person, place, and time. She appears well-developed and well-nourished.  HENT:  Right Ear: External ear normal.  Left Ear: External ear normal.  Nose: Nose normal.  Mouth/Throat: Oropharynx is clear and moist.  Neck: Normal range of motion. Neck supple. No  thyromegaly present.  Cardiovascular: Normal rate, regular rhythm and normal heart sounds.   Pulmonary/Chest: Effort normal and breath sounds normal.  Abdominal: Soft. Bowel sounds are normal.  Musculoskeletal: Normal range of motion.  Neurological: She is alert and oriented to person, place, and time.  Skin: Skin is warm and dry.  Psychiatric: She has a normal mood and affect.          Assessment & Plan:  Sherry Case was seen today for establish care.  Diagnoses and associated orders for this visit:  Chronic fatigue - CMP - CBC with Differential  PCOS (polycystic ovarian syndrome) - CMP - TSH - T3 - T4 - CBC with Differential  Type 2 diabetes mellitus without complication - CMP - Hemoglobin A1c - TSH - T3 - T4   call the office with any questions or concerns. We'll follow-up pending the results of labs, in 3-4 months and sooner as needed.

## 2014-03-27 NOTE — Patient Instructions (Signed)
Diabetes and Exercise Exercising regularly is important. It is not just about losing weight. It has many health benefits, such as:  Improving your overall fitness, flexibility, and endurance.  Increasing your bone density.  Helping with weight control.  Decreasing your body fat.  Increasing your muscle strength.  Reducing stress and tension.  Improving your overall health. People with diabetes who exercise gain additional benefits because exercise:  Reduces appetite.  Improves the body's use of blood sugar (glucose).  Helps lower or control blood glucose.  Decreases blood pressure.  Helps control blood lipids (such as cholesterol and triglycerides).  Improves the body's use of the hormone insulin by:  Increasing the body's insulin sensitivity.  Reducing the body's insulin needs.  Decreases the risk for heart disease because exercising:  Lowers cholesterol and triglycerides levels.  Increases the levels of good cholesterol (such as high-density lipoproteins [HDL]) in the body.  Lowers blood glucose levels. YOUR ACTIVITY PLAN  Choose an activity that you enjoy and set realistic goals. Your health care provider or diabetes educator can help you make an activity plan that works for you. Exercise regularly as directed by your health care provider. This includes:  Performing resistance training twice a week such as push-ups, sit-ups, lifting weights, or using resistance bands.  Performing 150 minutes of cardio exercises each week such as walking, running, or playing sports.  Staying active and spending no more than 90 minutes at one time being inactive. Even short bursts of exercise are good for you. Three 10-minute sessions spread throughout the day are just as beneficial as a single 30-minute session. Some exercise ideas include:  Taking the dog for a walk.  Taking the stairs instead of the elevator.  Dancing to your favorite song.  Doing an exercise  video.  Doing your favorite exercise with a friend. RECOMMENDATIONS FOR EXERCISING WITH TYPE 1 OR TYPE 2 DIABETES   Check your blood glucose before exercising. If blood glucose levels are greater than 240 mg/dL, check for urine ketones. Do not exercise if ketones are present.  Avoid injecting insulin into areas of the body that are going to be exercised. For example, avoid injecting insulin into:  The arms when playing tennis.  The legs when jogging.  Keep a record of:  Food intake before and after you exercise.  Expected peak times of insulin action.  Blood glucose levels before and after you exercise.  The type and amount of exercise you have done.  Review your records with your health care provider. Your health care provider will help you to develop guidelines for adjusting food intake and insulin amounts before and after exercising.  If you take insulin or oral hypoglycemic agents, watch for signs and symptoms of hypoglycemia. They include:  Dizziness.  Shaking.  Sweating.  Chills.  Confusion.  Drink plenty of water while you exercise to prevent dehydration or heat stroke. Body water is lost during exercise and must be replaced.  Talk to your health care provider before starting an exercise program to make sure it is safe for you. Remember, almost any type of activity is better than none. Document Released: 07/01/2003 Document Revised: 08/25/2013 Document Reviewed: 09/17/2012 Exit[employer] Patient Information 2015 [employer]. This information is not intended to replace advice given to you by your health care provider. Make sure you discuss any questions you have with your health care provider.  

## 2014-04-13 ENCOUNTER — Other Ambulatory Visit (INDEPENDENT_AMBULATORY_CARE_PROVIDER_SITE_OTHER): Payer: 59

## 2014-04-13 DIAGNOSIS — E119 Type 2 diabetes mellitus without complications: Secondary | ICD-10-CM

## 2014-04-13 DIAGNOSIS — R5382 Chronic fatigue, unspecified: Secondary | ICD-10-CM

## 2014-04-13 DIAGNOSIS — E8881 Metabolic syndrome: Secondary | ICD-10-CM

## 2014-04-13 DIAGNOSIS — E282 Polycystic ovarian syndrome: Secondary | ICD-10-CM

## 2014-04-13 LAB — COMPREHENSIVE METABOLIC PANEL
ALBUMIN: 3.7 g/dL (ref 3.5–5.2)
ALK PHOS: 55 U/L (ref 39–117)
ALT: 20 U/L (ref 0–35)
AST: 15 U/L (ref 0–37)
BUN: 13 mg/dL (ref 6–23)
CO2: 26 mEq/L (ref 19–32)
Calcium: 8.7 mg/dL (ref 8.4–10.5)
Chloride: 100 mEq/L (ref 96–112)
Creatinine, Ser: 0.6 mg/dL (ref 0.4–1.2)
GFR: 113.57 mL/min (ref >60.00)
Glucose, Bld: 88 mg/dL (ref 70–99)
POTASSIUM: 3.4 meq/L — AB (ref 3.5–5.1)
Sodium: 136 mEq/L (ref 135–145)
Total Bilirubin: 0.4 mg/dL (ref 0.2–1.2)
Total Protein: 7 g/dL (ref 6.0–8.3)

## 2014-04-13 LAB — LIPID PANEL
Cholesterol: 165 mg/dL (ref 0–200)
HDL: 35.2 mg/dL — ABNORMAL LOW (ref >39.00)
NonHDL: 129.8
TRIGLYCERIDES: 257 mg/dL — AB (ref 0.0–149.0)
Total CHOL/HDL Ratio: 5
VLDL: 51.4 mg/dL — ABNORMAL HIGH (ref 0.0–40.0)

## 2014-04-13 LAB — CBC WITH DIFFERENTIAL/PLATELET
Basophils Absolute: 0.1 10*3/uL (ref 0.0–0.1)
Basophils Relative: 0.6 % (ref 0.0–3.0)
EOS PCT: 5.8 % — AB (ref 0.0–5.0)
Eosinophils Absolute: 0.6 10*3/uL (ref 0.0–0.7)
HCT: 39.6 % (ref 36.0–46.0)
Hemoglobin: 12.5 g/dL (ref 12.0–15.0)
LYMPHS ABS: 1.7 10*3/uL (ref 0.7–4.0)
Lymphocytes Relative: 16.5 % (ref 12.0–46.0)
MCHC: 31.6 g/dL (ref 30.0–36.0)
MCV: 84.6 fl (ref 78.0–100.0)
MONOS PCT: 6.5 % (ref 3.0–12.0)
Monocytes Absolute: 0.7 10*3/uL (ref 0.1–1.0)
NEUTROS PCT: 70.6 % (ref 43.0–77.0)
Neutro Abs: 7.3 10*3/uL (ref 1.4–7.7)
PLATELETS: 339 10*3/uL (ref 150.0–400.0)
RBC: 4.68 Mil/uL (ref 3.87–5.11)
RDW: 17.7 % — ABNORMAL HIGH (ref 11.5–15.5)
WBC: 10.4 10*3/uL (ref 4.0–10.5)

## 2014-04-13 LAB — LDL CHOLESTEROL, DIRECT: LDL DIRECT: 80.1 mg/dL

## 2014-04-13 LAB — HEMOGLOBIN A1C: Hgb A1c MFr Bld: 6.2 % (ref 4.6–6.5)

## 2014-04-13 NOTE — Addendum Note (Signed)
Addended by: Joyce Gross R on: 04/13/2014 09:09 AM   Modules accepted: Orders

## 2014-04-13 NOTE — Addendum Note (Signed)
Addended by: Joyce Gross R on: 04/13/2014 09:15 AM   Modules accepted: Orders

## 2014-04-14 LAB — TSH: TSH: 1.94 u[IU]/mL (ref 0.35–4.50)

## 2014-04-15 LAB — T3, FREE: T3 FREE: 3.1 pg/mL (ref 2.3–4.2)

## 2014-04-15 LAB — T4, FREE: Free T4: 0.89 ng/dL (ref 0.60–1.60)

## 2014-04-22 ENCOUNTER — Ambulatory Visit (INDEPENDENT_AMBULATORY_CARE_PROVIDER_SITE_OTHER): Payer: 59 | Admitting: *Deleted

## 2014-04-22 DIAGNOSIS — M722 Plantar fascial fibromatosis: Secondary | ICD-10-CM

## 2014-04-22 NOTE — Patient Instructions (Signed)

## 2014-04-22 NOTE — Progress Notes (Signed)
PATIENT PRESENTS FOR ORTHOTIC PICK UP

## 2014-06-10 ENCOUNTER — Other Ambulatory Visit: Payer: Self-pay | Admitting: *Deleted

## 2014-06-10 DIAGNOSIS — R7303 Prediabetes: Secondary | ICD-10-CM

## 2014-06-10 DIAGNOSIS — E8881 Metabolic syndrome: Secondary | ICD-10-CM

## 2014-06-10 MED ORDER — METFORMIN HCL 850 MG PO TABS: 850.0000 mg | ORAL_TABLET | Freq: Two times a day (BID) | ORAL | Status: DC

## 2014-06-10 NOTE — Telephone Encounter (Signed)
Medication refill request: Metformin 850mg  tab  Last AEX:  06/13/13 TL. Post Op w/ SM 02/20/14 Next AEX: 04/30/15 SM Last MMG (if hormonal medication request): 06/17/13 [id number]:Neg Refill authorized: 06/13/13 #180/3R. Today?

## 2014-06-25 ENCOUNTER — Ambulatory Visit (INDEPENDENT_AMBULATORY_CARE_PROVIDER_SITE_OTHER): Payer: 59 | Admitting: Podiatry

## 2014-06-25 ENCOUNTER — Ambulatory Visit (INDEPENDENT_AMBULATORY_CARE_PROVIDER_SITE_OTHER): Payer: 59

## 2014-06-25 ENCOUNTER — Encounter: Payer: Self-pay | Admitting: Podiatry

## 2014-06-25 VITALS — BP 161/103 | HR 96 | Resp 14

## 2014-06-25 DIAGNOSIS — M779 Enthesopathy, unspecified: Secondary | ICD-10-CM | POA: Diagnosis not present

## 2014-06-25 DIAGNOSIS — R52 Pain, unspecified: Secondary | ICD-10-CM | POA: Diagnosis not present

## 2014-06-25 DIAGNOSIS — M722 Plantar fascial fibromatosis: Secondary | ICD-10-CM

## 2014-06-25 MED ORDER — MELOXICAM 15 MG PO TABS: 15.0000 mg | ORAL_TABLET | Freq: Every day | ORAL | Status: DC

## 2014-06-25 MED ORDER — TRIAMCINOLONE ACETONIDE 10 MG/ML IJ SUSP
10.0000 mg | Freq: Once | INTRAMUSCULAR | Status: AC
  Administered 2014-06-25: 10 mg

## 2014-06-25 NOTE — Progress Notes (Signed)
Subjective:     Patient ID: Sherry Case, female   DOB: 01/28/1971, 44 y.o.   MRN: [id number]  HPI patient states that her heel is still painful but improved but she's developed a lot of pain in the outside of her left ankle   Review of Systems     Objective:   Physical Exam Neurovascular status intact with muscle strength adequate and range of motion within normal limits. Patient's plantar fascia appears to be doing well with minimal discomfort upon palpation but the sinus tarsi left is quite inflamed and mild discomfort also into the lateral ankle gutter    Assessment:     Sinus tarsitis left with inflammation and fluid buildup and possible lateral ankle inflammation secondary to probable change in gait or possible arthritis    Plan:     H&P and x-ray reviewed. Injected the sinus tarsi left 3 mg Kenalog 5 mg Xylocaine and reviewed the plantar fascia and the continuation of conservative care. Reappoint for [place] to recheck

## 2014-07-09 ENCOUNTER — Encounter: Payer: Self-pay | Admitting: Podiatry

## 2014-07-09 ENCOUNTER — Ambulatory Visit (INDEPENDENT_AMBULATORY_CARE_PROVIDER_SITE_OTHER): Payer: 59 | Admitting: Podiatry

## 2014-07-09 VITALS — BP 160/101 | HR 92 | Resp 12

## 2014-07-09 DIAGNOSIS — M722 Plantar fascial fibromatosis: Secondary | ICD-10-CM | POA: Diagnosis not present

## 2014-07-09 DIAGNOSIS — M779 Enthesopathy, unspecified: Secondary | ICD-10-CM | POA: Diagnosis not present

## 2014-07-09 NOTE — Patient Instructions (Signed)

## 2014-07-09 NOTE — Progress Notes (Signed)
Subjective:     Patient ID: Sherry Case, female   DOB: 06/24/1970, 44 y.o.   MRN: [id number]  HPI patient states my left foot is feeling quite a bit better but still sore when I do a lot of walking.   Review of Systems     Objective:   Physical Exam Neurovascular status intact muscle strength adequate with significant diminishment of discomfort left but still present upon deep palpation    Assessment:     Plantar fasciitis that's improving and still present    Plan:     Advised on aggressive physical therapy anti-inflammatories and supportive shoe gear. Patient will be seen back as needed

## 2015-04-30 ENCOUNTER — Ambulatory Visit (INDEPENDENT_AMBULATORY_CARE_PROVIDER_SITE_OTHER): Payer: 59 | Admitting: Obstetrics & Gynecology

## 2015-04-30 ENCOUNTER — Encounter: Payer: Self-pay | Admitting: Obstetrics & Gynecology

## 2015-04-30 VITALS — BP 120/70 | HR 78 | Resp 16 | Ht 63.5 in | Wt 225.0 lb

## 2015-04-30 DIAGNOSIS — Z01419 Encounter for gynecological examination (general) (routine) without abnormal findings: Secondary | ICD-10-CM | POA: Diagnosis not present

## 2015-04-30 NOTE — Progress Notes (Signed)
45 y.o. [id number] MarriedCaucasianF here for annual exam.  Doing well.  Oldest is at [facility].  Betsy Coder is 45 years old.  Holiday was nice.    Denies vaginal bleeding.  No new issues this year.  No urinary issues.    PCP:  Bradly Bienenstock.  Last HbA1C was 10/15.  Level was 6.5.  Has appt scheduled with repeat Labs next month.  Reviewed blood work   Patient's last menstrual period was 09/05/2013.          Sexually active: Yes.    The current method of family planning is status post hysterectomy.    Exercising: No.  The patient does not participate in regular exercise at present. Smoker:  no  Health Maintenance: Pap:  02/03/13 Neg. HR HPV:neg History of abnormal Pap:  no MMG:  06/17/13 BIRADS1:neg.  Pt is going to do this in February.   Colonoscopy:  Never BMD:   Never TDaP:  01/2015 Screening Labs: PCP, Hb today: PCP, Urine today: PCP   reports that she has never smoked. She has never used smokeless tobacco. She reports that she does not drink alcohol or use illicit drugs.  Past Medical History  Diagnosis Date  . PCOS (polycystic ovarian syndrome)     tx with metformin  . Colon polyps 2014    history  hyperplastic  . SVD (spontaneous vaginal delivery)     x 3  . Hypertension     borderline - no meds  . GERD (gastroesophageal reflux disease)     occasional - no meds - diet controlled  . Headache(784.0)     otc med prn  . Anemia due to chronic blood loss 01/12/2014    Past Surgical History  Procedure Laterality Date  . Dilation and curettage of uterus      x2, with SABs  . Breast lumpectomy Right 20 years ago    Benign Enlarged Lymph Node  . Tonsillectomy and adenoidectomy  32 year ago  . Finger surgery  25 years ago    right hand - pointer finger  . Foot surgery  2013    old fracture bone piece removed - right foot  . Hernia repair    . Wisdom tooth extraction    . External cephalic version  123456    version for SVD w/ epidural  . Laparoscopic  assisted vaginal hysterectomy Bilateral 01/12/2014    Procedure: LAPAROSCOPIC ASSISTED VAGINAL HYSTERECTOMY with bilateral salpingectomy ;  Surgeon: Lyman Speller, MD;  Location: [facility];  Service: Gynecology;  Laterality: Bilateral;    Current Outpatient Prescriptions  Medication Sig Dispense Refill  . FINACEA 15 % cream at bedtime.  0  . lisinopril (PRINIVIL) 10 MG tablet Take 10 mg by mouth daily.    . metFORMIN (GLUCOPHAGE) 850 MG tablet Take 1 tablet (850 mg total) by mouth 2 (two) times daily with a meal. Start after completing first presciption 180 tablet 3  . metroNIDAZOLE (METROGEL) 1 % gel daily.  0  . meloxicam (MOBIC) 15 MG tablet Take 1 tablet (15 mg total) by mouth daily. (Patient not taking: Reported on 04/30/2015) 30 tablet 2   No current facility-administered medications for this visit.    Family History  Problem Relation Age of Onset  . Colon cancer Mother 27    stage 1  . Diabetes Mother   . Lymphoma Paternal Grandmother   . Breast cancer Cousin     ROS:  Pertinent items are noted in HPI.  Otherwise, a  comprehensive ROS was negative.  Exam:   BP 120/70 mmHg  Pulse 78  Resp 16  Ht 5' 3.5" (1.613 m)  Wt 225 lb (102.059 kg)  BMI 39.23 kg/m2  LMP 09/05/2013  Weight change: =5#  Height: 5' 3.5" (161.3 cm)  Ht Readings from Last 3 Encounters:  04/30/15 5' 3.5" (1.613 m)  03/27/14 5\' 3"  (1.6 m)  01/16/14 5\' 3"  (1.6 m)   General appearance: alert, cooperative and appears stated age Head: Normocephalic, without obvious abnormality, atraumatic Neck: no adenopathy, supple, symmetrical, trachea midline and thyroid normal to inspection and palpation Lungs: clear to auscultation bilaterally Breasts: normal appearance, no masses or tenderness Heart: regular rate and rhythm Abdomen: soft, non-tender; bowel sounds normal; no masses,  no organomegaly Extremities: extremities normal, atraumatic, no cyanosis or edema Skin: Skin color, texture, turgor normal. No rashes  or lesions Lymph nodes: Cervical, supraclavicular, and axillary nodes normal. No abnormal inguinal nodes palpated Neurologic: Grossly normal   Pelvic: External genitalia:  no lesions              Urethra:  normal appearing urethra with no masses, tenderness or lesions              Bartholins and Skenes: normal                 Vagina: normal appearing vagina with normal color and discharge, no lesions              Cervix: absent              Pap taken: No. Bimanual Exam:  Uterus:  uterus absent              Adnexa: normal adnexa and no mass, fullness, tenderness               Rectovaginal: Confirms               Anus:  normal sphincter tone, no lesions  Chaperone was present for exam.  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  Information given to pt.  She will call and schedule.   Labs done with Dr. Dema Severin.  Recent labs reviewed with pt.   Pap smear not obtained Return annually or prn

## 2015-12-10 IMAGING — CT CT ABD-PELV W/O CM
2 of 4 series · 17 of 46 positions shown, 19 images · non-contrast
Comparison: None.

CLINICAL DATA: FLANK PAIN BACK PAIN, right flank pain

EXAM:
CT ABDOMEN AND PELVIS WITHOUT CONTRAST
TECHNIQUE: Multidetector CT imaging of the abdomen and pelvis was performed
following the standard protocol without intravenous contrast.

[Series 2: stone study 5.0 i30f 1 · axial · 0.75mm/px · z∈[+268,+703]mm · 14 of 97 slices shown, 16 images]
[im 5/97  soft-tissue]
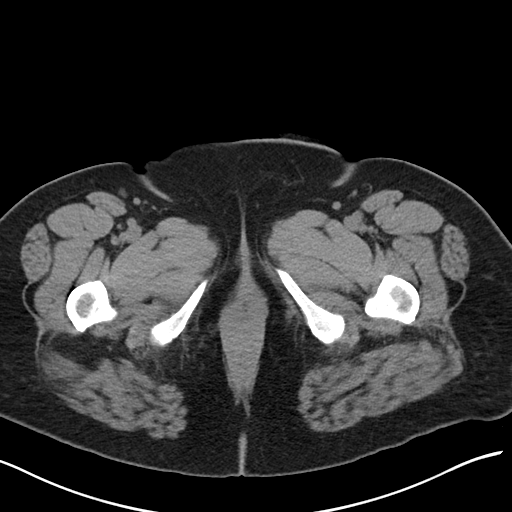
[im 5/97  bone]
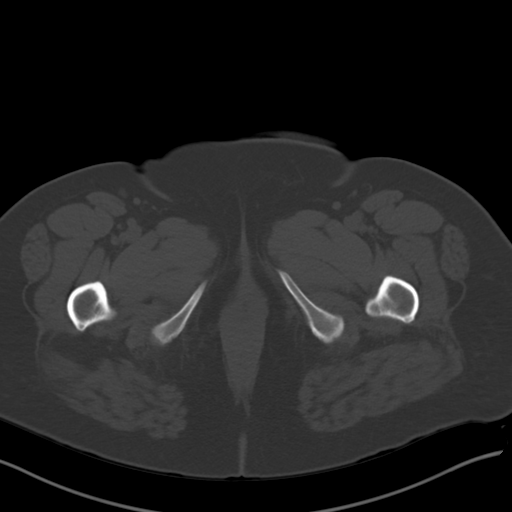
[im 13/97  soft-tissue]
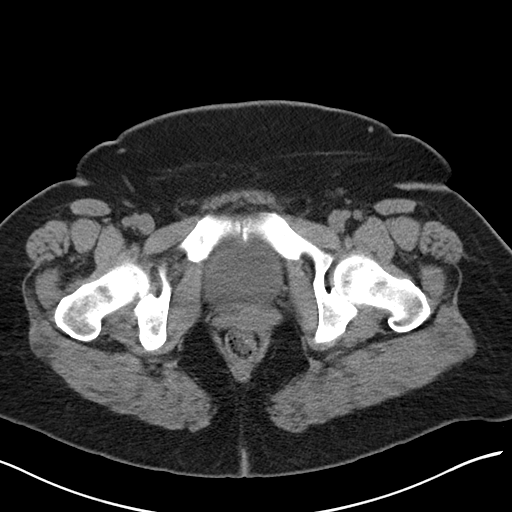
[im 17/97  soft-tissue]
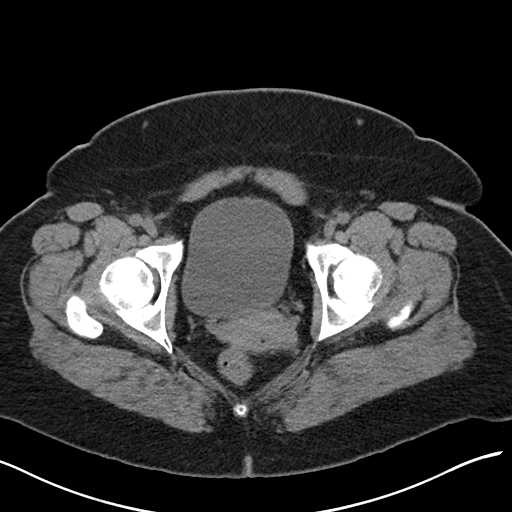
[im 26/97  soft-tissue]
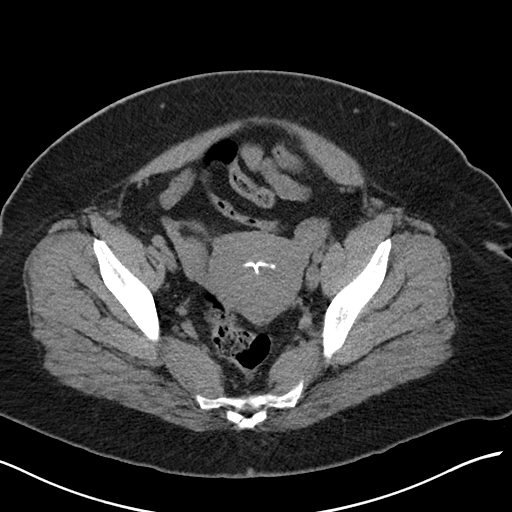
[im 34/97  soft-tissue]
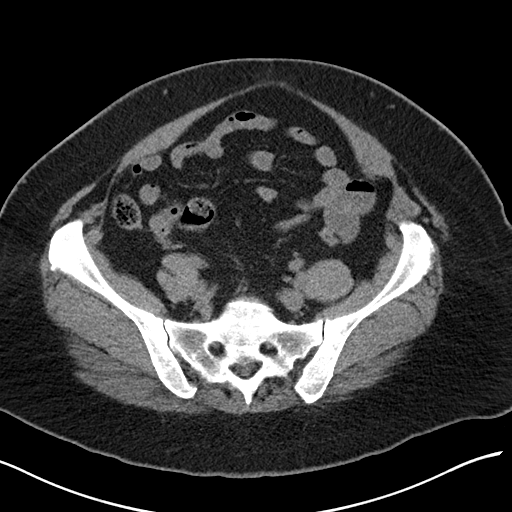
[im 38/97  soft-tissue]
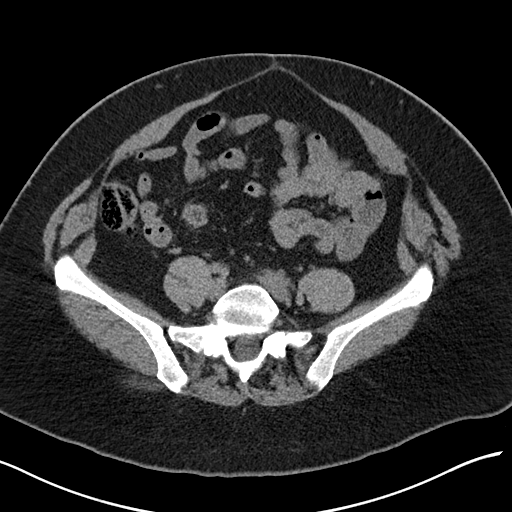
[im 46/97  soft-tissue]
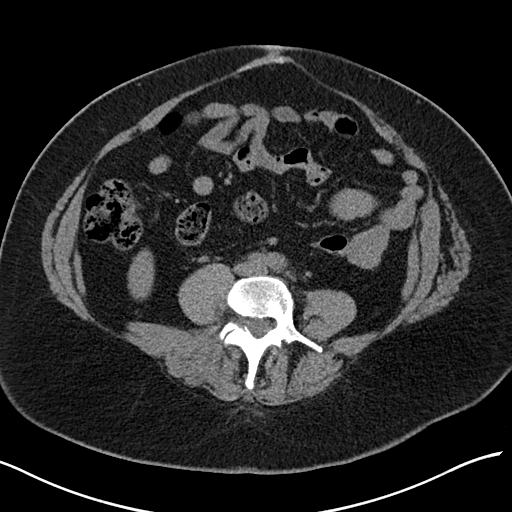
[im 51/97  soft-tissue]
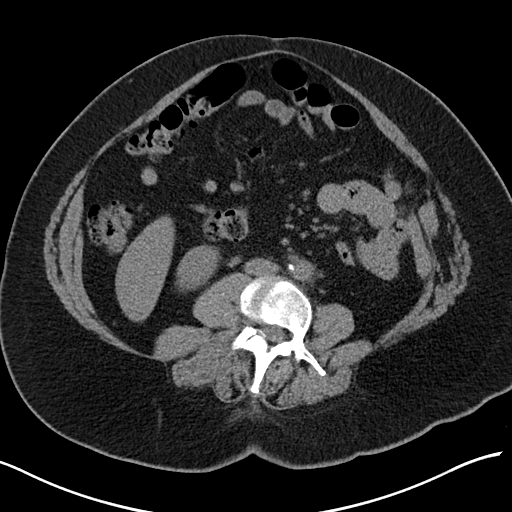
[im 59/97  soft-tissue]
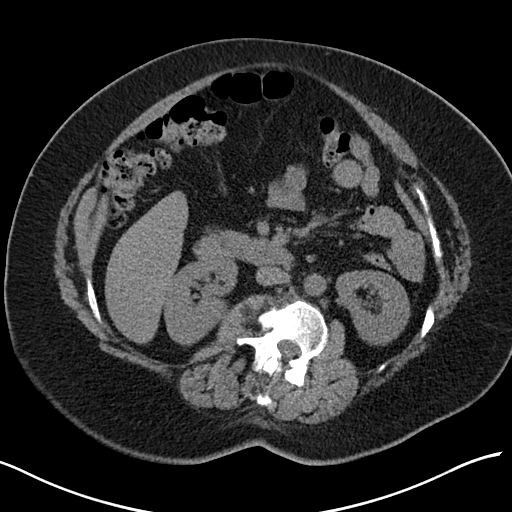
[im 59/97  bone]
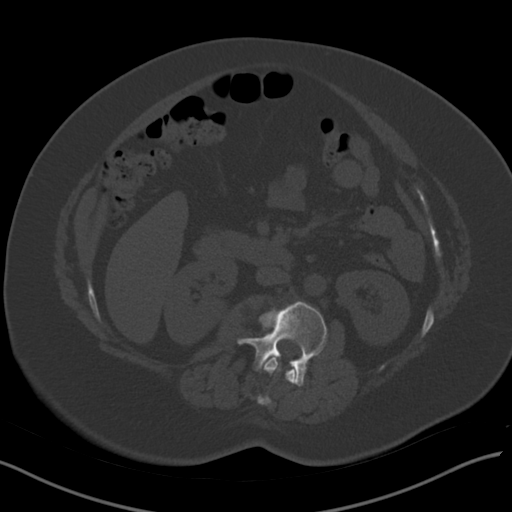
[im 63/97  soft-tissue]
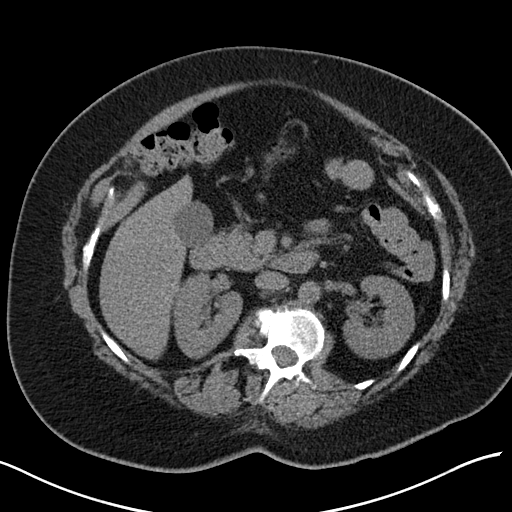
[im 71/97  soft-tissue]
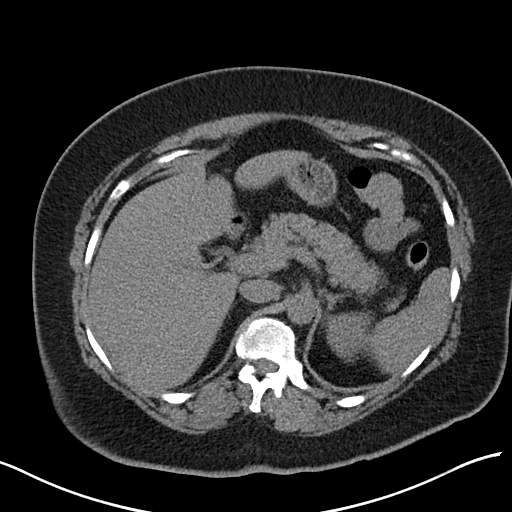
[im 80/97  soft-tissue]
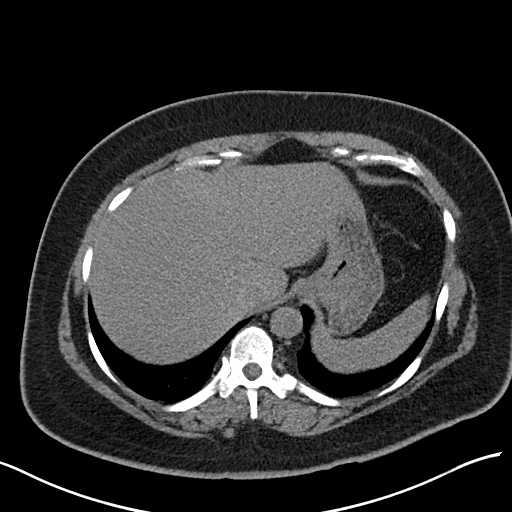
[im 84/97  soft-tissue]
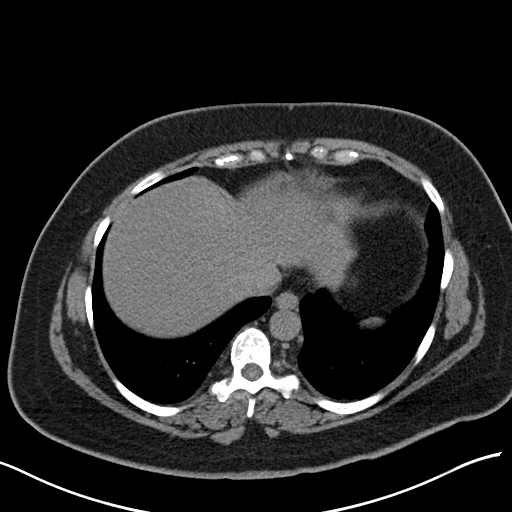
[im 92/97  soft-tissue]
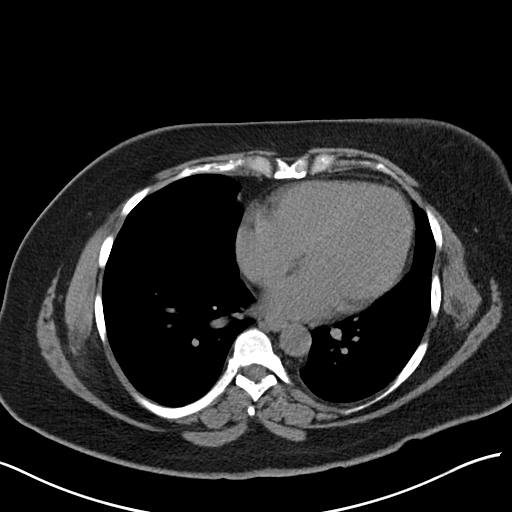

[Series 5: coronal soft tissue · coronal · 0.84mm/px · 3 of 104 slices shown]
[im 35/104  soft-tissue]
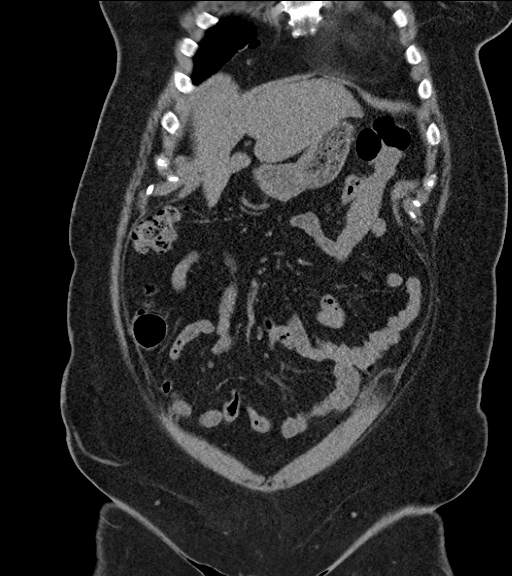
[im 46/104  soft-tissue]
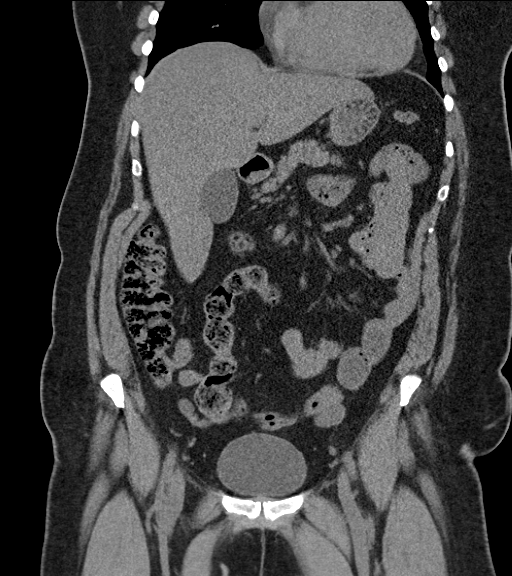
[im 58/104  soft-tissue]
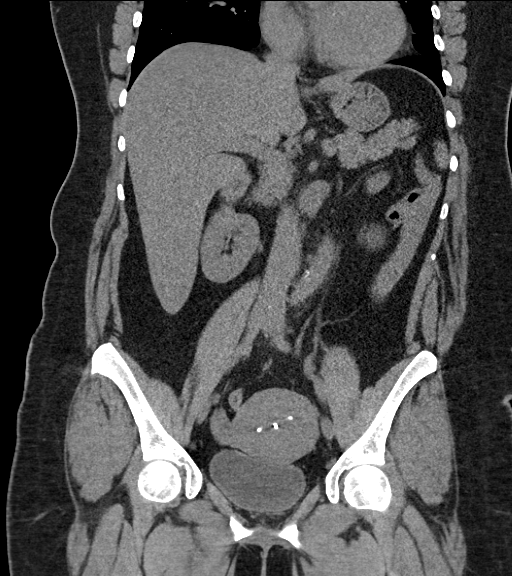

[17 of 46 positions shown; findings below may reference images not displayed]

FINDINGS: Lung bases are clear.  No pericardial fluid.

Non IV contrast images demonstrate no focal hepatic lesion.
Gallbladder, pancreas, spleen, adrenal glands, and kidneys are
normal. There is no nephrolithiasis or ureterolithiasis.

Stomach, small bowel, and cecum are normal. Appendix is normal. The
colon rectosigmoid colon are normal.

Abdominal aortic are normal caliber. No retroperitoneal periportal
lymphadenopathy.

No free fluid the pelvis. No dysuria stones or bladder stone. IUD in
expected location within the uterus. Next are normal. No pelvic
lymphadenopathy. No aggressive osseous lesion. Levoscoliosis of the
spine with associated endplate spurring
IMPRESSION: 1. No nephrolithiasis or ureterolithiasis.
2. Normal appendix

## 2016-02-09 LAB — HM COLONOSCOPY

## 2016-05-25 DIAGNOSIS — D72829 Elevated white blood cell count, unspecified: Secondary | ICD-10-CM

## 2016-05-25 HISTORY — DX: Elevated white blood cell count, unspecified: D72.829

## 2016-06-03 ENCOUNTER — Observation Stay (HOSPITAL_COMMUNITY)
Admission: EM | Admit: 2016-06-03 | Discharge: 2016-06-06 | DRG: 446 | Disposition: A | Discharge status: Home / Self Care | Payer: 59 | Attending: Family Medicine | Admitting: Family Medicine

## 2016-06-03 ENCOUNTER — Emergency Department (HOSPITAL_COMMUNITY): Payer: 59

## 2016-06-03 ENCOUNTER — Encounter (HOSPITAL_COMMUNITY): Payer: Self-pay | Admitting: Emergency Medicine

## 2016-06-03 DIAGNOSIS — D72829 Elevated white blood cell count, unspecified: Secondary | ICD-10-CM

## 2016-06-03 DIAGNOSIS — I10 Essential (primary) hypertension: Secondary | ICD-10-CM | POA: Diagnosis present

## 2016-06-03 DIAGNOSIS — Z807 Family history of other malignant neoplasms of lymphoid, hematopoietic and related tissues: Secondary | ICD-10-CM | POA: Diagnosis not present

## 2016-06-03 DIAGNOSIS — Z833 Family history of diabetes mellitus: Secondary | ICD-10-CM

## 2016-06-03 DIAGNOSIS — R6510 Systemic inflammatory response syndrome (SIRS) of non-infectious origin without acute organ dysfunction: Secondary | ICD-10-CM | POA: Diagnosis present

## 2016-06-03 DIAGNOSIS — R109 Unspecified abdominal pain: Secondary | ICD-10-CM

## 2016-06-03 DIAGNOSIS — K219 Gastro-esophageal reflux disease without esophagitis: Secondary | ICD-10-CM | POA: Diagnosis present

## 2016-06-03 DIAGNOSIS — Z7984 Long term (current) use of oral hypoglycemic drugs: Secondary | ICD-10-CM | POA: Diagnosis not present

## 2016-06-03 DIAGNOSIS — Z800 Family history of malignant neoplasm of digestive organs: Secondary | ICD-10-CM | POA: Diagnosis not present

## 2016-06-03 DIAGNOSIS — E282 Polycystic ovarian syndrome: Secondary | ICD-10-CM | POA: Diagnosis present

## 2016-06-03 DIAGNOSIS — E669 Obesity, unspecified: Secondary | ICD-10-CM | POA: Diagnosis not present

## 2016-06-03 DIAGNOSIS — R1013 Epigastric pain: Secondary | ICD-10-CM

## 2016-06-03 DIAGNOSIS — K828 Other specified diseases of gallbladder: Principal | ICD-10-CM | POA: Diagnosis present

## 2016-06-03 DIAGNOSIS — R7303 Prediabetes: Secondary | ICD-10-CM | POA: Diagnosis not present

## 2016-06-03 MED ORDER — ONDANSETRON HCL 4 MG/2ML IJ SOLN
4.0000 mg | Freq: Once | INTRAMUSCULAR | Status: AC
  Administered 2016-06-03: 4 mg via INTRAVENOUS
  Filled 2016-06-03: qty 2

## 2016-06-03 NOTE — ED Provider Notes (Signed)
[place] DEPT Provider Note   CSN: [id number] Arrival date & time: 06/03/16  2304  By signing my name below, I, Sherry Case, attest that this documentation has been prepared under the direction and in the presence of [facility], PA-C. Electronically Signed: Dora Case, Scribe. 06/03/2016. 11:33 PM.  History   Chief Complaint Chief Complaint  Patient presents with  . Nausea  . Diarrhea  . Abdominal Pain    The history is provided by the patient. No language interpreter was used.     HPI Comments: Sherry Case is a 46 y.o. female with PMHx significant for GERD, HTN, pre-diabetes, and PCOS who presents to the Emergency Department via the fire department complaining of sudden onset, constant, epigastric abdominal pain beginning earlier tonight. She reports discomfort from her lower sternum to her epigastrium and describes the pain as a pressure-like sensation. She reports associated lightheadedness, near-syncope, SOB, diarrhea, nausea, vomiting, and chills. No LOC. She notes her shortness of breath lasted only briefly and has now resolved. Pt reports she has had three episodes of diarrhea since onset and is unsure if any were bloody. Pt reports three episodes of vomiting since arrival to the ED and none PTA; she has received Zofran here just prior to evaluation. She received aspirin en route to the ED. No recent travel, camping or unusual food intake. 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 No h/o HLD. No h/o cardiac problems with her mother or father. She is a non-smoker. She denies chest pain, dysuria, urinary frequency, leg swelling, or any other associated symptoms.  Past Medical History:  Diagnosis Date  . Anemia due to chronic blood loss 01/12/2014  . Colon polyps 2014   history  hyperplastic  . GERD (gastroesophageal reflux disease)    occasional - no meds - diet controlled  . Headache(784.0)    otc med prn  .  Hypertension    borderline - no meds  . PCOS (polycystic ovarian syndrome)    tx with metformin  . SVD (spontaneous vaginal delivery)    x 3    Patient Active Problem List   Diagnosis Date Noted  . Metabolic syndrome [id number]  . Pre-diabetes 03/14/2013  . Essential hypertension, benign 03/14/2013  . PCOS (polycystic ovarian syndrome)     Past Surgical History:  Procedure Laterality Date  . BREAST LUMPECTOMY Right 20 years ago   Benign Enlarged Lymph Node  . DILATION AND CURETTAGE OF UTERUS     x2, with SABs  . EXTERNAL CEPHALIC VERSION  123456   version for SVD w/ epidural  . FINGER SURGERY  25 years ago   right hand - pointer finger  . FOOT SURGERY  2013   old fracture bone piece removed - right foot  . HERNIA REPAIR    . LAPAROSCOPIC ASSISTED VAGINAL HYSTERECTOMY Bilateral 01/12/2014   Procedure: LAPAROSCOPIC ASSISTED VAGINAL HYSTERECTOMY with bilateral salpingectomy ;  Surgeon: Lyman Speller, MD;  Location: [facility];  Service: Gynecology;  Laterality: Bilateral;  . TONSILLECTOMY AND ADENOIDECTOMY  32 year ago  . WISDOM TOOTH EXTRACTION      OB History    Gravida Para Term Preterm AB Living   5 3 3   2 3    SAB TAB Ectopic Multiple Live Births   2       3       Home Medications    Prior to Admission medications   Medication Sig Start Date End Date Taking? Authorizing  Provider  FINACEA 15 % cream at bedtime. 03/17/15   Historical Provider, MD  lisinopril (PRINIVIL) 10 MG tablet Take 10 mg by mouth daily. 04/14/15   Historical Provider, MD  meloxicam (MOBIC) 15 MG tablet Take 1 tablet (15 mg total) by mouth daily. Patient not taking: Reported on 04/30/2015 06/25/14   Wallene Huh, DPM  metFORMIN (GLUCOPHAGE) 850 MG tablet Take 1 tablet (850 mg total) by mouth 2 (two) times daily with a meal. Start after completing first presciption 06/10/14   Megan Salon, MD  metroNIDAZOLE (METROGEL) 1 % gel daily. 03/16/15   Historical Provider, MD    Family  History Family History  Problem Relation Age of Onset  . Colon cancer Mother 48    stage 1  . Diabetes Mother   . Lymphoma Paternal Grandmother   . Breast cancer Cousin     Social History Social History  Substance Use Topics  . Smoking status: Never Smoker  . Smokeless tobacco: Never Used  . Alcohol use No     Allergies   Patient has no known allergies.   Review of Systems Review of Systems  Constitutional: Positive for chills.  Respiratory: Positive for shortness of breath (resolved).   Cardiovascular: Negative for leg swelling.  Gastrointestinal: Positive for abdominal pain, diarrhea, nausea and vomiting.  Genitourinary: Negative for dysuria and frequency.  Neurological: Positive for light-headedness. Negative for syncope.  All other systems reviewed and are negative.    Physical Exam Updated Vital Signs BP (!) 96/50 (BP Location: Left Arm)   Pulse 93   Temp 98.4 F (36.9 C) (Oral)   Resp 20   LMP 09/05/2013   SpO2 100%   Physical Exam  Constitutional: She is oriented to person, place, and time. She appears well-developed and well-nourished. No distress.  Uncomfortable appearing female.  HENT:  Head: Normocephalic and atraumatic.  Eyes: Conjunctivae and EOM are normal.  Neck: Neck supple. No JVD present. No tracheal deviation present.  Cardiovascular: Normal rate and regular rhythm.   No murmur heard. Pulmonary/Chest: Effort normal and breath sounds normal. No respiratory distress. She has no wheezes. She has no rales. She exhibits tenderness (lower sternum).  Abdominal: Soft. Bowel sounds are normal. She exhibits no distension. There is tenderness. There is no rebound and no guarding.  TTP of the epigastrium with no rebound or guarding. Negative Murphy's.  Genitourinary:  Genitourinary Comments: Small amount of white vaginal discharge.   Musculoskeletal: She exhibits no edema.  Neurological: She is alert and oriented to person, place, and time.  Skin:  Skin is warm and dry.  Psychiatric: She has a normal mood and affect. Her behavior is normal.  Nursing note and vitals reviewed.    ED Treatments / Results  Labs (all labs ordered are listed, but only abnormal results are displayed) Labs Reviewed  LIPASE, BLOOD  COMPREHENSIVE METABOLIC PANEL  URINALYSIS, ROUTINE W REFLEX MICROSCOPIC  CBC WITH DIFFERENTIAL/PLATELET    EKG  EKG Interpretation None       Radiology No results found.  Procedures Procedures (including critical care time)  DIAGNOSTIC STUDIES: Oxygen Saturation is 100% on RA, normal by my interpretation.    COORDINATION OF CARE: 11:39 PM Discussed treatment plan with pt at bedside and pt agreed to plan.  Medications Ordered in ED Medications  ondansetron (ZOFRAN) injection 4 mg (4 mg Intravenous Given 06/03/16 2325)     Initial Impression / Assessment and Plan / ED Course  I have reviewed the triage vital signs and  the nursing notes.  Pertinent labs & imaging results that were available during my care of the patient were reviewed by me and considered in my medical decision making (see chart for details).    Eboni Bambrick is a 46 y.o. female who presents to ED for epigastric abdominal pain, n/v. Initially presented hypotensive with 96/50. BP improved with fluids. HR ranging 90's-100's. Afebrile. TTP of epigastrium. Labs reviewed showing leukocytosis of 29.5 with ab neutro of 26.5. CT abdomen with no acute findings. Acute abdominal series reassuring. EKG NSR and trop negative. Wet prep shows only rare WBC's. UA without signs of infection. Blood and urine cx's obtained. Broad spectrum ABX, Vanc/zosyn, initiated. Hospitalist consulted who will admit.   Patient discussed with Dr. Randal Buba who agrees with treatment plan.    Final Clinical Impressions(s) / ED Diagnoses   Final diagnoses:  None    New Prescriptions New Prescriptions   No medications on file   I personally performed the services described  in this documentation, which was scribed in my presence. The recorded information has been reviewed and is accurate.    [facility], PA-C 06/04/16 [id number]    April Palumbo, MD 06/04/16 [phone]

## 2016-06-03 NOTE — ED Triage Notes (Signed)
Pt c/o nausea, diarrhea, and abdominal pain since yesterday; describes abd pain as intermittent and radiates into chest; pt states she had severe GERD last night; tonight became dizzy and SOB and called 911; aspiring given by fire dept on scene; zofran given en route

## 2016-06-03 NOTE — ED Notes (Signed)
Episode of emesis on arrival

## 2016-06-04 ENCOUNTER — Emergency Department (HOSPITAL_COMMUNITY): Payer: 59

## 2016-06-04 ENCOUNTER — Encounter (HOSPITAL_COMMUNITY): Payer: Self-pay | Admitting: Radiology

## 2016-06-04 DIAGNOSIS — R6510 Systemic inflammatory response syndrome (SIRS) of non-infectious origin without acute organ dysfunction: Secondary | ICD-10-CM

## 2016-06-04 DIAGNOSIS — E282 Polycystic ovarian syndrome: Secondary | ICD-10-CM

## 2016-06-04 DIAGNOSIS — I10 Essential (primary) hypertension: Secondary | ICD-10-CM

## 2016-06-04 LAB — URINALYSIS, ROUTINE W REFLEX MICROSCOPIC
Bilirubin Urine: NEGATIVE
Glucose, UA: NEGATIVE mg/dL
Hgb urine dipstick: NEGATIVE
KETONES UR: 5 mg/dL — AB
Leukocytes, UA: NEGATIVE
NITRITE: NEGATIVE
PH: 6 (ref 5.0–8.0)
PROTEIN: 100 mg/dL — AB
Specific Gravity, Urine: 1.024 (ref 1.005–1.030)

## 2016-06-04 LAB — BASIC METABOLIC PANEL
Anion gap: 5 (ref 5–15)
BUN: 14 mg/dL (ref 6–20)
CO2: 24 mmol/L (ref 22–32)
CREATININE: 0.85 mg/dL (ref 0.44–1.00)
Calcium: 7.1 mg/dL — ABNORMAL LOW (ref 8.9–10.3)
Chloride: 108 mmol/L (ref 101–111)
GFR calc Af Amer: >60 mL/min (ref >60)
GFR calc non Af Amer: >60 mL/min (ref >60)
GLUCOSE: 210 mg/dL — AB (ref 65–99)
Potassium: 3.7 mmol/L (ref 3.5–5.1)
SODIUM: 137 mmol/L (ref 135–145)

## 2016-06-04 LAB — CBC
HCT: 39.3 % (ref 36.0–46.0)
Hemoglobin: 12.5 g/dL (ref 12.0–15.0)
MCH: 30 pg (ref 26.0–34.0)
MCHC: 31.8 g/dL (ref 30.0–36.0)
MCV: 94.2 fL (ref 78.0–100.0)
PLATELETS: 272 10*3/uL (ref 150–400)
RBC: 4.17 MIL/uL (ref 3.87–5.11)
RDW: 13.6 % (ref 11.5–15.5)
WBC: 24 10*3/uL — ABNORMAL HIGH (ref 4.0–10.5)

## 2016-06-04 LAB — RAPID URINE DRUG SCREEN, HOSP PERFORMED
AMPHETAMINES: NOT DETECTED
BARBITURATES: NOT DETECTED
Benzodiazepines: NOT DETECTED
Cocaine: NOT DETECTED
Opiates: NOT DETECTED
Tetrahydrocannabinol: NOT DETECTED

## 2016-06-04 LAB — WET PREP, GENITAL
Clue Cells Wet Prep HPF POC: NONE SEEN
Sperm: NONE SEEN
TRICH WET PREP: NONE SEEN
YEAST WET PREP: NONE SEEN

## 2016-06-04 LAB — CBC WITH DIFFERENTIAL/PLATELET
BASOS PCT: 0 %
Basophils Absolute: 0 10*3/uL (ref 0.0–0.1)
EOS ABS: 0.3 10*3/uL (ref 0.0–0.7)
EOS PCT: 1 %
HCT: 42.4 % (ref 36.0–46.0)
Hemoglobin: 13.7 g/dL (ref 12.0–15.0)
LYMPHS ABS: 1.2 10*3/uL (ref 0.7–4.0)
Lymphocytes Relative: 4 %
MCH: 30.3 pg (ref 26.0–34.0)
MCHC: 32.3 g/dL (ref 30.0–36.0)
MCV: 93.8 fL (ref 78.0–100.0)
MONO ABS: 1.5 10*3/uL — AB (ref 0.1–1.0)
Monocytes Relative: 5 %
NEUTROS PCT: 90 %
Neutro Abs: 26.5 10*3/uL — ABNORMAL HIGH (ref 1.7–7.7)
PLATELETS: 293 10*3/uL (ref 150–400)
RBC: 4.52 MIL/uL (ref 3.87–5.11)
RDW: 13.3 % (ref 11.5–15.5)
WBC: 29.5 10*3/uL — ABNORMAL HIGH (ref 4.0–10.5)

## 2016-06-04 LAB — COMPREHENSIVE METABOLIC PANEL
ALK PHOS: 45 U/L (ref 38–126)
ALT: 15 U/L (ref 14–54)
AST: 18 U/L (ref 15–41)
Albumin: 3.4 g/dL — ABNORMAL LOW (ref 3.5–5.0)
Anion gap: 7 (ref 5–15)
BUN: 15 mg/dL (ref 6–20)
CALCIUM: 7.9 mg/dL — AB (ref 8.9–10.3)
CHLORIDE: 104 mmol/L (ref 101–111)
CO2: 25 mmol/L (ref 22–32)
CREATININE: 0.77 mg/dL (ref 0.44–1.00)
Glucose, Bld: 198 mg/dL — ABNORMAL HIGH (ref 65–99)
Potassium: 3.9 mmol/L (ref 3.5–5.1)
Sodium: 136 mmol/L (ref 135–145)
Total Bilirubin: 0.7 mg/dL (ref 0.3–1.2)
Total Protein: 6.7 g/dL (ref 6.5–8.1)

## 2016-06-04 LAB — LACTIC ACID, PLASMA
LACTIC ACID, VENOUS: 1.5 mmol/L (ref 0.5–1.9)
LACTIC ACID, VENOUS: 1.3 mmol/L (ref 0.5–1.9)

## 2016-06-04 LAB — I-STAT TROPONIN, ED: Troponin i, poc: 0 ng/mL (ref 0.00–0.08)

## 2016-06-04 LAB — I-STAT CG4 LACTIC ACID, ED: LACTIC ACID, VENOUS: 3.45 mmol/L — AB (ref 0.5–1.9)

## 2016-06-04 LAB — INFLUENZA PANEL BY PCR (TYPE A & B)
Influenza A By PCR: NEGATIVE
Influenza B By PCR: NEGATIVE

## 2016-06-04 LAB — PROCALCITONIN: Procalcitonin: 0.29 ng/mL

## 2016-06-04 LAB — LIPASE, BLOOD: LIPASE: 22 U/L (ref 11–51)

## 2016-06-04 MED ORDER — SODIUM CHLORIDE 0.9 % IV BOLUS (SEPSIS)
1000.0000 mL | Freq: Once | INTRAVENOUS | Status: AC
  Administered 2016-06-04: 1000 mL via INTRAVENOUS

## 2016-06-04 MED ORDER — SODIUM CHLORIDE 0.9% FLUSH
3.0000 mL | Freq: Two times a day (BID) | INTRAVENOUS | Status: DC
  Administered 2016-06-05 – 2016-06-06 (×3): 3 mL via INTRAVENOUS

## 2016-06-04 MED ORDER — IOPAMIDOL (ISOVUE-300) INJECTION 61%
100.0000 mL | Freq: Once | INTRAVENOUS | Status: AC | PRN
  Administered 2016-06-04: 100 mL via INTRAVENOUS

## 2016-06-04 MED ORDER — IOPAMIDOL (ISOVUE-300) INJECTION 61%
INTRAVENOUS | Status: AC
  Filled 2016-06-04: qty 100

## 2016-06-04 MED ORDER — GI COCKTAIL ~~LOC~~: 30.0000 mL | Freq: Three times a day (TID) | ORAL | Status: DC | PRN

## 2016-06-04 MED ORDER — ACETAMINOPHEN 325 MG PO TABS
650.0000 mg | ORAL_TABLET | ORAL | Status: DC | PRN
Start: 2016-06-04 — End: 2016-06-06
  Administered 2016-06-04 – 2016-06-05 (×3): 650 mg via ORAL
  Filled 2016-06-04 (×3): qty 2

## 2016-06-04 MED ORDER — ONDANSETRON HCL 4 MG/2ML IJ SOLN: 4.0000 mg | Freq: Four times a day (QID) | INTRAMUSCULAR | Status: DC | PRN

## 2016-06-04 MED ORDER — VANCOMYCIN HCL IN DEXTROSE 1-5 GM/200ML-% IV SOLN
1000.0000 mg | Freq: Once | INTRAVENOUS | Status: AC
  Administered 2016-06-04: 1000 mg via INTRAVENOUS
  Filled 2016-06-04: qty 200

## 2016-06-04 MED ORDER — PIPERACILLIN-TAZOBACTAM 3.375 G IVPB 30 MIN
3.3750 g | Freq: Once | INTRAVENOUS | Status: AC
  Administered 2016-06-04: 3.375 g via INTRAVENOUS
  Filled 2016-06-04: qty 50

## 2016-06-04 MED ORDER — SODIUM CHLORIDE 0.9 % IV BOLUS (SEPSIS)
500.0000 mL | Freq: Once | INTRAVENOUS | Status: AC
  Administered 2016-06-04: 500 mL via INTRAVENOUS

## 2016-06-04 MED ORDER — ENOXAPARIN SODIUM 40 MG/0.4ML ~~LOC~~ SOLN
40.0000 mg | Freq: Every day | SUBCUTANEOUS | Status: DC
Start: 2016-06-04 — End: 2016-06-06
  Administered 2016-06-04 – 2016-06-06 (×3): 40 mg via SUBCUTANEOUS
  Filled 2016-06-04 (×3): qty 0.4

## 2016-06-04 MED ORDER — VANCOMYCIN HCL IN DEXTROSE 1-5 GM/200ML-% IV SOLN
1000.0000 mg | Freq: Three times a day (TID) | INTRAVENOUS | Status: DC
Start: 2016-06-04 — End: 2016-06-05
  Administered 2016-06-04 – 2016-06-05 (×3): 1000 mg via INTRAVENOUS
  Filled 2016-06-04 (×3): qty 200

## 2016-06-04 MED ORDER — PIPERACILLIN-TAZOBACTAM 3.375 G IVPB
3.3750 g | Freq: Three times a day (TID) | INTRAVENOUS | Status: DC
  Administered 2016-06-04 – 2016-06-05 (×3): 3.375 g via INTRAVENOUS
  Filled 2016-06-04 (×3): qty 50

## 2016-06-04 NOTE — Consult Note (Signed)
Subjective:   HPI  The patient is a 46 year old female with a history of obesity and PCO S who was admitted to the hospital yesterday after sudden onset of epigastric abdominal pain. Patient states that she had not been feeling very well that day but at night at about 9 or 9:30 she started to get severe epigastric pain. It was not radiating. There was associated nausea and vomiting. Denies hematemesis. She had a CT scan of the abdomen and pelvis which did not show any acute findings. Also no gallstones seen in the gallbladder. Patient states that yesterday she had a little diarrhea. She seems to be better today. Denies history of peptic ulcer disease. Denies significant issues with heartburn. States her mother had gallbladder trouble. Symptoms with her mother were similar with the pain until they finally figured it out according to her. Patient has a leukocytosis being treated for SIRS.  Review of Systems No chest pain or shortness of breath  Past Medical History:  Diagnosis Date  . Anemia due to chronic blood loss 01/12/2014  . Colon polyps 2014   history  hyperplastic  . GERD (gastroesophageal reflux disease)    occasional - no meds - diet controlled  . Headache(784.0)    otc med prn  . Hypertension    borderline - no meds  . PCOS (polycystic ovarian syndrome)    tx with metformin  . SVD (spontaneous vaginal delivery)    x 3   Past Surgical History:  Procedure Laterality Date  . BREAST LUMPECTOMY Right 20 years ago   Benign Enlarged Lymph Node  . DILATION AND CURETTAGE OF UTERUS     x2, with SABs  . EXTERNAL CEPHALIC VERSION  123456   version for SVD w/ epidural  . FINGER SURGERY  25 years ago   right hand - pointer finger  . FOOT SURGERY  2013   old fracture bone piece removed - right foot  . HERNIA REPAIR    . LAPAROSCOPIC ASSISTED VAGINAL HYSTERECTOMY Bilateral 01/12/2014   Procedure: LAPAROSCOPIC ASSISTED VAGINAL HYSTERECTOMY with bilateral salpingectomy ;  Surgeon: Lyman Speller, MD;  Location: [facility];  Service: Gynecology;  Laterality: Bilateral;  . TONSILLECTOMY AND ADENOIDECTOMY  32 year ago  . WISDOM TOOTH EXTRACTION     Social History   Social History  . Marital status: Married    Spouse name: N/A  . Number of children: N/A  . Years of education: N/A   Occupational History  . Not on file.   Social History Main Topics  . Smoking status: Never Smoker  . Smokeless tobacco: Never Used  . Alcohol use No  . Drug use: No  . Sexual activity: Yes    Birth control/ protection: Surgical   Other Topics Concern  . Not on file   Social History Narrative  . No narrative on file   family history includes Breast cancer in her cousin; Colon cancer (age of onset: 67) in her mother; Diabetes in her mother; Lymphoma in her paternal grandmother.  Current Facility-Administered Medications:  .  acetaminophen (TYLENOL) tablet 650 mg, 650 mg, Oral, Q4H PRN, Patrecia Pour, MD, 650 mg at 06/04/16 1322 .  enoxaparin (LOVENOX) injection 40 mg, 40 mg, Subcutaneous, Daily, Etta Quill, DO, 40 mg at 06/04/16 1036 .  gi cocktail (Maalox,Lidocaine,Donnatal), 30 mL, Oral, TID PRN, Etta Quill, DO .  ondansetron [facility]) injection 4 mg, 4 mg, Intravenous, Q6H PRN, Etta Quill, DO .  piperacillin-tazobactam (ZOSYN) IVPB 3.375  g, 3.375 g, Intravenous, Q8H, Dorrene German, RPH, 3.375 g at 06/04/16 1302 .  sodium chloride flush (NS) 0.9 % injection 3 mL, 3 mL, Intravenous, Q12H, Jared M Gardner, DO .  vancomycin (VANCOCIN) IVPB 1000 mg/200 mL premix, 1,000 mg, Intravenous, Q8H, Dorrene German, RPH, 1,000 mg at 06/04/16 1301 No Known Allergies   Objective:     BP 136/78 (BP Location: Right Arm)   Pulse (!) 105   Temp 98.6 F (37 C) (Oral)   Resp 20   Ht 5\' 3"  (1.6 m)   Wt 99.8 kg (220 lb)   LMP 09/05/2013   SpO2 100%   BMI 38.97 kg/m     Alert and oriented  No acute distress  Heart regular rhythm no murmurs  Lungs clear  Abdomen:  Bowel sounds normal, soft, nontender at this time  Laboratory No components found for: D1    Assessment:     Sudden onset of epigastric pain of uncertain etiology.  Leukocytosis  Diarrhea  CT of the abdomen and pelvis unrevealing for significant pathology to explain symptoms      Plan:     I will order a HIDA scan with ejection fraction. I wonder if she might have biliary dyskinesia. If the biliary scan is unrevealing then we can consider endoscopy to evaluate the upper GI tract. The leukocytosis etiology is unclear. Would not expect this degree of leukocytosis with peptic ulcer disease. Agree with checking GI pathogens panel. We will follow.

## 2016-06-04 NOTE — ED Notes (Signed)
Blood cultures were just obtained and new labs were added.  Spoke with Dr. Alcario Drought and he is ok with this being drawn with am blood draw which is scheduled for 5am.

## 2016-06-04 NOTE — Progress Notes (Signed)
PROGRESS NOTE  Sherry Case  [id number] DOB: 12-12-1970 DOA: 06/03/2016 PCP: Jettie Booze, NP  Outpatient Specialists: Unknown GI  Brief Narrative: Sherry Case is a 46 y.o. female with a history of obesity and PCOS who presented with sudden onset, constant, epigastric abdominal pain/pressure that began after several hours of fatigue. She had a few episodes of diarrhea and called EMS when she felt like she was going to pass out on the toilet. On arrival to the ED she had several episodes of emesis without blood. She initially presented afebrile but hypotensive, 96/50, and tachycardic, HR in low 100's with epigastric tenderness. Labs showed neutrophilic leukocytosis of [id number]. CT abd/pelvis was unremarkable. CXR was negative, clean catch UA showed 5-30 WBC/HPF and rare bacteria. wet prep negative. Flu PCR negative. ECG showed NSR and negative troponin. Blood and urine cultures were obtained, vanc/zosyn given along with sepsis IVF's, [facility] called to admit. Through this morning she's improved subjectively. Lactate cleared from 3.45 to 1.3, WBC improved modestly to 24.   She reports a history of reflux symptoms but doesn't take any medications, and she's had chronic headaches for which she takes NSAIDs. No EtOH or smoking. 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 Finished steroid taper for laryngitis 2 weeks ago.   Assessment & Plan: Principal Problem:   SIRS (systemic inflammatory response syndrome) (HCC) Active Problems:   PCOS (polycystic ovarian syndrome)   Essential hypertension, benign  SIRS and epigastric pain: Uncertain underlying etiology.  - Will continue broad spectrum coverage pending culture data.  - With only symptoms related to GI, h/o GERD, and NSAID use, will consult GI for evaluation.  - Continue clear liquids pending GI evaluation, advance as tolerated if no procedure planned.  - Monitor leukocytosis daily -  GI pathogen panel ordered - DC tele, no events.   HTN:  - Holding BP meds for borderline low BPs  PCOS:  - hold metformin (not taking this, so this isn't cause of lactate elevation)  Obesity: ?NAFLD, but LFTs wnl. - With hyperglycemia, check HbA1c. - Check TSH  DVT prophylaxis: Lovenox Code Status: Full Family Communication: None at bedside this AM Disposition Plan: Anticipate DC to home once work up complete and patient improved.   Consultants:   GI, Dr. Penelope Coop  Procedures:   None  Antimicrobials:  Vancomycin 2/10 >>   Zosyn 2/10 >>    Subjective: Pt has mild epigastric pain which has improved and headache which is a chronic problem for her. No chest pain, dyspnea, palpitations, dysuria, urinary changes, changes in bowel habits, dysphagia, odynophagia. No rashes, arthralgias, myalgias and no fevers. She's had fatigue for several months, but attributes this to a hectic life.   Objective: Vitals:   06/04/16 0330 06/04/16 0353 06/04/16 0400 06/04/16 0451  BP: 106/65 106/65 110/60 121/65  Pulse: 106 103 101 (!) 106  Resp: 12 16 18 20   Temp:  98.9 F (37.2 C)  98.6 F (37 C)  TempSrc:  Oral  Oral  SpO2: 98% 98% 98% 100%  Weight:      Height:        Intake/Output Summary (Last 24 hours) at 06/04/16 1353 Last data filed at 06/04/16 0600  Gross per 24 hour  Intake             3790 ml  Output              600 ml  Net  3190 ml   Filed Weights   06/04/16 0327  Weight: 99.8 kg (220 lb)    Examination: General exam: Pleasant, obese 46 y.o. female in no distress Respiratory system: Non-labored breathing room air. Clear to auscultation bilaterally.  Cardiovascular system: Regular rate and rhythm. No murmur, rub, or gallop. No JVD, and no pedal edema. Gastrointestinal system: Abdomen soft, mild epigastric tenderness without rebound or guarding, non-distended, with normoactive bowel sounds. No organomegaly. +wide diastasis. Central nervous system: Alert  and oriented. No focal neurological deficits. Extremities: Warm, no deformities Skin: No rashes, lesions no ulcers Psychiatry: Judgement and insight appear normal. Mood & affect appropriate.   Data Reviewed: I have personally reviewed following labs and imaging studies  CBC:  Recent Labs Lab 06/03/16 2331 06/04/16 0445  WBC 29.5* 24.0*  NEUTROABS 26.5*  --   HGB 13.7 12.5  HCT 42.4 39.3  MCV 93.8 94.2  PLT 293 [id number]   Basic Metabolic Panel:  Recent Labs Lab 06/03/16 2331 06/04/16 0445  NA 136 137  K 3.9 3.7  CL 104 108  CO2 25 24  GLUCOSE 198* 210*  BUN 15 14  CREATININE 0.77 0.85  CALCIUM 7.9* 7.1*   GFR: Estimated Creatinine Clearance: 94.2 mL/min (by C-G formula based on SCr of 0.85 mg/dL). Liver Function Tests:  Recent Labs Lab 06/03/16 2331  AST 18  ALT 15  ALKPHOS 45  BILITOT 0.7  PROT 6.7  ALBUMIN 3.4*    Recent Labs Lab 06/03/16 2331  LIPASE 22   No results for input(s): AMMONIA in the last 168 hours. Coagulation Profile: No results for input(s): INR, PROTIME in the last 168 hours. Cardiac Enzymes: No results for input(s): CKTOTAL, CKMB, CKMBINDEX, TROPONINI in the last 168 hours. BNP (last 3 results) No results for input(s): PROBNP in the last 8760 hours. HbA1C: No results for input(s): HGBA1C in the last 72 hours. CBG: No results for input(s): GLUCAP in the last 168 hours. Lipid Profile: No results for input(s): CHOL, HDL, LDLCALC, TRIG, CHOLHDL, LDLDIRECT in the last 72 hours. Thyroid Function Tests: No results for input(s): TSH, T4TOTAL, FREET4, T3FREE, THYROIDAB in the last 72 hours. Anemia Panel: No results for input(s): VITAMINB12, FOLATE, FERRITIN, TIBC, IRON, RETICCTPCT in the last 72 hours. Urine analysis:    Component Value Date/Time   COLORURINE YELLOW 06/04/2016 0040   APPEARANCEUR HAZY (A) 06/04/2016 0040   LABSPEC 1.024 06/04/2016 0040   PHURINE 6.0 06/04/2016 0040   GLUCOSEU NEGATIVE 06/04/2016 0040   HGBUR NEGATIVE  06/04/2016 0040   BILIRUBINUR NEGATIVE 06/04/2016 0040   BILIRUBINUR - 02/03/2013 1459   KETONESUR 5 (A) 06/04/2016 0040   PROTEINUR 100 (A) 06/04/2016 0040   UROBILINOGEN 0.2 05/10/2013 1811   NITRITE NEGATIVE 06/04/2016 0040   LEUKOCYTESUR NEGATIVE 06/04/2016 0040   Recent Results (from the past 240 hour(s))  Wet prep, genital     Status: Abnormal   Collection Time: 06/04/16  1:37 AM  Result Value Ref Range Status   Yeast Wet Prep HPF POC NONE SEEN NONE SEEN Final   Trich, Wet Prep NONE SEEN NONE SEEN Final    Comment: Swab received with less than 0.5 mL of saline, saline added to specimen, interpret results with caution.   Clue Cells Wet Prep HPF POC NONE SEEN NONE SEEN Final   WBC, Wet Prep HPF POC RARE (A) NONE SEEN Final   Sperm NONE SEEN  Final      Radiology Studies: Ct Abdomen Pelvis W Contrast  Result Date: 06/04/2016 CLINICAL  DATA:  46 y/o  F; epigastric pain with nausea and vomiting. EXAM: CT ABDOMEN AND PELVIS WITH CONTRAST TECHNIQUE: Multidetector CT imaging of the abdomen and pelvis was performed using the standard protocol following bolus administration of intravenous contrast. CONTRAST:  130mL ISOVUE-300 IOPAMIDOL (ISOVUE-300) INJECTION 61% COMPARISON:  05/10/2013 CT abdomen and pelvis FINDINGS: Lower chest: No acute abnormality. Hepatobiliary: No focal liver abnormality is seen. Hepatic steatosis. No gallstones, gallbladder wall thickening, or biliary dilatation. Pancreas: Unremarkable. No pancreatic ductal dilatation or surrounding inflammatory changes. Spleen: Normal in size without focal abnormality. Adrenals/Urinary Tract: Adrenal glands are unremarkable. Subcentimeter nonspecific lucencies and right kidney are likely cysts. Kidneys are otherwise normal, without renal calculi, focal lesion, or hydronephrosis. Bladder is unremarkable. Stomach/Bowel: Stomach is within normal limits. Appendix appears normal. No evidence of bowel wall thickening, distention, or inflammatory  changes. Vascular/Lymphatic: Aortic atherosclerosis with mild infrarenal calcification. No enlarged abdominal or pelvic lymph nodes. Reproductive: Status post hysterectomy. No adnexal masses. Other: No abdominal wall hernia or abnormality. No abdominopelvic ascites. Musculoskeletal: No acute or significant osseous findings. 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 New in IMPRESSION: 1. No acute process identified. 2. Mild aortic atherosclerosis. 3. 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 Electronically Signed   By: Kristine Garbe M.D.   On: 06/04/2016 01:17   Dg Abdomen Acute W/chest  Result Date: 06/04/2016 CLINICAL DATA:  Nausea diarrhea and abdominal pain EXAM: DG ABDOMEN ACUTE W/ 1V CHEST COMPARISON:  CT 06/04/2016 FINDINGS: Single-view chest demonstrates minimal basilar atelectasis on the left. Heart size normal. No pneumothorax. Supine and upright views of the abdomen demonstrate no free air. Moderate to marked scoliosis of the thoracolumbar spine. Nonobstructed bowel-gas pattern. Residual contrast material in the collecting systems and bladder. IMPRESSION: Minimal left basilar atelectasis. Nonobstructed gas pattern. Residual contrast material in the collecting systems of the kidneys and the bladder. Electronically Signed   By: Donavan Foil M.D.   On: 06/04/2016 01:46    Scheduled Meds: . enoxaparin (LOVENOX) injection  40 mg Subcutaneous Daily  . piperacillin-tazobactam (ZOSYN)  IV  3.375 g Intravenous Q8H  . sodium chloride flush  3 mL Intravenous Q12H  . vancomycin  1,000 mg Intravenous Q8H   Continuous Infusions:   LOS: 0 days   Time spent: 25 minutes.  Vance Gather, MD [facility] Pager [phone]  If 7PM-7AM, please contact night-coverage [email or web address] Password [email or web address] 06/04/2016, 1:53 PM

## 2016-06-04 NOTE — H&P (Signed)
History and Physical    Sherry Case [id number] DOB: June 24, 1970 DOA: 06/03/2016   PCP: Jettie Booze, NP Chief Complaint:  Chief Complaint  Patient presents with  . Nausea  . Diarrhea  . Abdominal Pain    HPI: Sherry Case is a 46 y.o. female with medical history significant of PCOS.  Patient presents to the ED with c/o sudden onset, constant, improving, epigastric abdominal pain.  Symptoms onset earlier this evening.  Located in epigastric area.  Improving some since inset.  Pressure like sensation.  Reports associated N/V/D.  ED Course: 3 episodes of N/V in ED.  Hasnt taken metformin "in a long time" for PCOS.  BP initially borderline low in the 0000000 systolic, improves to 0000000.  Review of Systems: As per HPI otherwise 10 point review of systems negative.    Past Medical History:  Diagnosis Date  . Anemia due to chronic blood loss 01/12/2014  . Colon polyps 2014   history  hyperplastic  . GERD (gastroesophageal reflux disease)    occasional - no meds - diet controlled  . Headache(784.0)    otc med prn  . Hypertension    borderline - no meds  . PCOS (polycystic ovarian syndrome)    tx with metformin  . SVD (spontaneous vaginal delivery)    x 3    Past Surgical History:  Procedure Laterality Date  . BREAST LUMPECTOMY Right 20 years ago   Benign Enlarged Lymph Node  . DILATION AND CURETTAGE OF UTERUS     x2, with SABs  . EXTERNAL CEPHALIC VERSION  123456   version for SVD w/ epidural  . FINGER SURGERY  25 years ago   right hand - pointer finger  . FOOT SURGERY  2013   old fracture bone piece removed - right foot  . HERNIA REPAIR    . LAPAROSCOPIC ASSISTED VAGINAL HYSTERECTOMY Bilateral 01/12/2014   Procedure: LAPAROSCOPIC ASSISTED VAGINAL HYSTERECTOMY with bilateral salpingectomy ;  Surgeon: Lyman Speller, MD;  Location: [facility];  Service: Gynecology;  Laterality: Bilateral;  . TONSILLECTOMY AND ADENOIDECTOMY  32 year ago  . WISDOM TOOTH EXTRACTION        reports that she has never smoked. She has never used smokeless tobacco. She reports that she does not drink alcohol or use drugs.  No Known Allergies  Family History  Problem Relation Age of Onset  . Colon cancer Mother 60    stage 1  . Diabetes Mother   . Lymphoma Paternal Grandmother   . Breast cancer Cousin      Prior to Admission medications   Medication Sig Start Date End Date Taking? Authorizing Provider  lisinopril (PRINIVIL) 10 MG tablet Take 10 mg by mouth daily. 04/14/15  Yes Historical Provider, MD  metFORMIN (GLUCOPHAGE) 850 MG tablet Take 1 tablet (850 mg total) by mouth 2 (two) times daily with a meal. Start after completing first presciption 06/10/14  Yes Megan Salon, MD    Physical Exam: Vitals:   06/04/16 0200 06/04/16 0230 06/04/16 0300 06/04/16 0327  BP: 119/68 122/64    Pulse: 104 105 109   Resp: 19 16 19    Temp:      TempSrc:      SpO2: 98% 98% 97%   Weight:    99.8 kg (220 lb)  Height:    5\' 3"  (1.6 m)      Constitutional: NAD, calm, comfortable Eyes: PERRL, lids and conjunctivae normal ENMT: Mucous membranes are moist. Posterior pharynx clear of any  exudate or lesions.Normal dentition.  Neck: normal, supple, no masses, no thyromegaly Respiratory: clear to auscultation bilaterally, no wheezing, no crackles. Normal respiratory effort. No accessory muscle use.  Cardiovascular: Regular rate and rhythm, no murmurs / rubs / gallops. No extremity edema. 2+ pedal pulses. No carotid bruits.  Abdomen: no tenderness, no masses palpated. No hepatosplenomegaly. Bowel sounds positive.  Musculoskeletal: no clubbing / cyanosis. No joint deformity upper and lower extremities. Good ROM, no contractures. Normal muscle tone.  Skin: no rashes, lesions, ulcers. No induration Neurologic: CN 2-12 grossly intact. Sensation intact, DTR normal. Strength 5/5 in all 4.  Psychiatric: Normal judgment and insight. Alert and oriented x 3. Normal mood.    Labs on  Admission: I have personally reviewed following labs and imaging studies  CBC:  Recent Labs Lab 06/03/16 2331  WBC 29.5*  NEUTROABS 26.5*  HGB 13.7  HCT 42.4  MCV 93.8  PLT 0000000   Basic Metabolic Panel:  Recent Labs Lab 06/03/16 2331  NA 136  K 3.9  CL 104  CO2 25  GLUCOSE 198*  BUN 15  CREATININE 0.77  CALCIUM 7.9*   GFR: Estimated Creatinine Clearance: 100.1 mL/min (by C-G formula based on SCr of 0.77 mg/dL). Liver Function Tests:  Recent Labs Lab 06/03/16 2331  AST 18  ALT 15  ALKPHOS 45  BILITOT 0.7  PROT 6.7  ALBUMIN 3.4*    Recent Labs Lab 06/03/16 2331  LIPASE 22   No results for input(s): AMMONIA in the last 168 hours. Coagulation Profile: No results for input(s): INR, PROTIME in the last 168 hours. Cardiac Enzymes: No results for input(s): CKTOTAL, CKMB, CKMBINDEX, TROPONINI in the last 168 hours. BNP (last 3 results) No results for input(s): PROBNP in the last 8760 hours. HbA1C: No results for input(s): HGBA1C in the last 72 hours. CBG: No results for input(s): GLUCAP in the last 168 hours. Lipid Profile: No results for input(s): CHOL, HDL, LDLCALC, TRIG, CHOLHDL, LDLDIRECT in the last 72 hours. Thyroid Function Tests: No results for input(s): TSH, T4TOTAL, FREET4, T3FREE, THYROIDAB in the last 72 hours. Anemia Panel: No results for input(s): VITAMINB12, FOLATE, FERRITIN, TIBC, IRON, RETICCTPCT in the last 72 hours. Urine analysis:    Component Value Date/Time   COLORURINE YELLOW 06/04/2016 0040   APPEARANCEUR HAZY (A) 06/04/2016 0040   LABSPEC 1.024 06/04/2016 0040   PHURINE 6.0 06/04/2016 0040   GLUCOSEU NEGATIVE 06/04/2016 0040   HGBUR NEGATIVE 06/04/2016 0040   BILIRUBINUR NEGATIVE 06/04/2016 0040   BILIRUBINUR - 02/03/2013 1459   KETONESUR 5 (A) 06/04/2016 0040   PROTEINUR 100 (A) 06/04/2016 0040   UROBILINOGEN 0.2 05/10/2013 1811   NITRITE NEGATIVE 06/04/2016 0040   LEUKOCYTESUR NEGATIVE 06/04/2016 0040   Sepsis  Labs: @LABRCNTIP (procalcitonin:4,lacticidven:4) ) Recent Results (from the past 240 hour(s))  Wet prep, genital     Status: Abnormal   Collection Time: 06/04/16  1:37 AM  Result Value Ref Range Status   Yeast Wet Prep HPF POC NONE SEEN NONE SEEN Final   Trich, Wet Prep NONE SEEN NONE SEEN Final    Comment: Swab received with less than 0.5 mL of saline, saline added to specimen, interpret results with caution.   Clue Cells Wet Prep HPF POC NONE SEEN NONE SEEN Final   WBC, Wet Prep HPF POC RARE (A) NONE SEEN Final   Sperm NONE SEEN  Final     Radiological Exams on Admission: Ct Abdomen Pelvis W Contrast  Result Date: 06/04/2016 CLINICAL DATA:  46 y/o  F; epigastric pain with nausea and vomiting. EXAM: CT ABDOMEN AND PELVIS WITH CONTRAST TECHNIQUE: Multidetector CT imaging of the abdomen and pelvis was performed using the standard protocol following bolus administration of intravenous contrast. CONTRAST:  125mL ISOVUE-300 IOPAMIDOL (ISOVUE-300) INJECTION 61% COMPARISON:  05/10/2013 CT abdomen and pelvis FINDINGS: Lower chest: No acute abnormality. Hepatobiliary: No focal liver abnormality is seen. Hepatic steatosis. No gallstones, gallbladder wall thickening, or biliary dilatation. Pancreas: Unremarkable. No pancreatic ductal dilatation or surrounding inflammatory changes. Spleen: Normal in size without focal abnormality. Adrenals/Urinary Tract: Adrenal glands are unremarkable. Subcentimeter nonspecific lucencies and right kidney are likely cysts. Kidneys are otherwise normal, without renal calculi, focal lesion, or hydronephrosis. Bladder is unremarkable. Stomach/Bowel: Stomach is within normal limits. Appendix appears normal. No evidence of bowel wall thickening, distention, or inflammatory changes. Vascular/Lymphatic: Aortic atherosclerosis with mild infrarenal calcification. No enlarged abdominal or pelvic lymph nodes. Reproductive: Status post hysterectomy. No adnexal masses. Other: No abdominal  wall hernia or abnormality. No abdominopelvic ascites. Musculoskeletal: No acute or significant osseous findings. 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 New in IMPRESSION: 1. No acute process identified. 2. Mild aortic atherosclerosis. 3. 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 Electronically Signed   By: Kristine Garbe M.D.   On: 06/04/2016 01:17   Dg Abdomen Acute W/chest  Result Date: 06/04/2016 CLINICAL DATA:  Nausea diarrhea and abdominal pain EXAM: DG ABDOMEN ACUTE W/ 1V CHEST COMPARISON:  CT 06/04/2016 FINDINGS: Single-view chest demonstrates minimal basilar atelectasis on the left. Heart size normal. No pneumothorax. Supine and upright views of the abdomen demonstrate no free air. Moderate to marked scoliosis of the thoracolumbar spine. Nonobstructed bowel-gas pattern. Residual contrast material in the collecting systems and bladder. IMPRESSION: Minimal left basilar atelectasis. Nonobstructed gas pattern. Residual contrast material in the collecting systems of the kidneys and the bladder. Electronically Signed   By: Donavan Foil M.D.   On: 06/04/2016 01:46    EKG: Independently reviewed.  Assessment/Plan Principal Problem:   SIRS (systemic inflammatory response syndrome) (HCC) Active Problems:   PCOS (polycystic ovarian syndrome)   Essential hypertension, benign    1. SIRS - Unclear source 1. Given GI symptoms: N/V/D epigastric pain, suspect a GI source 1. Possibly consult GI in AM if patient doesn't improve? 2. WBC seems very high for this to be just a viral gastroenteritis. 2. GI pathogen panel ordered 3. Will order influenza panel as well given the high rate of influenza locally at the present time (although symptoms dont really sound like the flu). 4. Continue empiric zosyn / vanc 5. Tele monitor 6. IVF: got sepsis bolus and 125 cc/hr 7. Sepsis pathway 8. pro calcitonin pending 9. BCx pending 10. GI cocktails PRN 2. HTN - hold BP meds for borderline low  BPs 3. PCOS - hold metformin (which she hasnt been on for a long time anyhow).   DVT prophylaxis: Lovenox Code Status: Full Family Communication: Family at bedside Consults called: None Admission status: Admit to inpatient   Etta Quill DO [facility] Pager [phone] from 7PM-7AM  If 7AM-7PM, please contact the day physician for the patient [email or web address] Password [email or web address]  06/04/2016, 3:30 AM

## 2016-06-04 NOTE — Progress Notes (Signed)
Pharmacy Antibiotic Note  Sherry Case is a 46 y.o. female c/o n/d and abdominal pain admitted on 06/03/2016 with sepsis.  Pharmacy has been consulted for zosyn/vancomycin dosing.  Plan: Zosyn 3.375g IV q8h (4 hour infusion).  Vancomycin 1 Gm IV q8h VT=15-20 mg/L Daily SCr F/u levels as needed     Temp (24hrs), Avg:98.4 F (36.9 C), Min:98.4 F (36.9 C), Max:98.4 F (36.9 C)   Recent Labs Lab 06/03/16 2331 06/04/16 0041  WBC 29.5*  --   CREATININE 0.77  --   LATICACIDVEN  --  3.45*    CrCl cannot be calculated (Unknown ideal weight.).    No Known Allergies  Antimicrobials this admission: 2/11 zosyn >>  2/11 vancomycin >>   Dose adjustments this admission:   Microbiology results:  BCx:   UCx:    Sputum:    MRSA PCR:   Thank you for allowing pharmacy to be a part of this patient's care.  Dorrene German 06/04/2016 3:02 AM

## 2016-06-04 NOTE — ED Notes (Signed)
Shown labs to Dr Randal Buba.

## 2016-06-05 ENCOUNTER — Inpatient Hospital Stay (HOSPITAL_COMMUNITY): Payer: 59

## 2016-06-05 DIAGNOSIS — I10 Essential (primary) hypertension: Secondary | ICD-10-CM | POA: Diagnosis not present

## 2016-06-05 DIAGNOSIS — R1013 Epigastric pain: Secondary | ICD-10-CM | POA: Diagnosis not present

## 2016-06-05 DIAGNOSIS — E282 Polycystic ovarian syndrome: Secondary | ICD-10-CM | POA: Diagnosis not present

## 2016-06-05 LAB — COMPREHENSIVE METABOLIC PANEL
ALBUMIN: 3.2 g/dL — AB (ref 3.5–5.0)
ALT: 17 U/L (ref 14–54)
AST: 12 U/L — AB (ref 15–41)
Alkaline Phosphatase: 36 U/L — ABNORMAL LOW (ref 38–126)
Anion gap: 3 — ABNORMAL LOW (ref 5–15)
BUN: 6 mg/dL (ref 6–20)
CHLORIDE: 109 mmol/L (ref 101–111)
CO2: 28 mmol/L (ref 22–32)
CREATININE: 0.63 mg/dL (ref 0.44–1.00)
Calcium: 8.1 mg/dL — ABNORMAL LOW (ref 8.9–10.3)
GFR calc non Af Amer: >60 mL/min (ref >60)
GLUCOSE: 138 mg/dL — AB (ref 65–99)
Potassium: 3.5 mmol/L (ref 3.5–5.1)
SODIUM: 140 mmol/L (ref 135–145)
Total Bilirubin: 0.4 mg/dL (ref 0.3–1.2)
Total Protein: 6.2 g/dL — ABNORMAL LOW (ref 6.5–8.1)

## 2016-06-05 LAB — GC/CHLAMYDIA PROBE AMP (~~LOC~~) NOT AT ARMC
Chlamydia: NEGATIVE
Neisseria Gonorrhea: NEGATIVE

## 2016-06-05 LAB — CBC
HCT: 38.2 % (ref 36.0–46.0)
HEMOGLOBIN: 12.2 g/dL (ref 12.0–15.0)
MCH: 30.2 pg (ref 26.0–34.0)
MCHC: 31.9 g/dL (ref 30.0–36.0)
MCV: 94.6 fL (ref 78.0–100.0)
PLATELETS: 243 10*3/uL (ref 150–400)
RBC: 4.04 MIL/uL (ref 3.87–5.11)
RDW: 14 % (ref 11.5–15.5)
WBC: 8 10*3/uL (ref 4.0–10.5)

## 2016-06-05 LAB — TSH: TSH: 1.008 u[IU]/mL (ref 0.350–4.500)

## 2016-06-05 LAB — URINE CULTURE: Culture: <10000 — AB

## 2016-06-05 MED ORDER — SINCALIDE 5 MCG IJ SOLR
2.0000 ug | Freq: Once | INTRAMUSCULAR | Status: AC
  Administered 2016-06-05: 2 ug via INTRAVENOUS
  Filled 2016-06-05: qty 5

## 2016-06-05 NOTE — Progress Notes (Signed)
PROGRESS NOTE  Sherry Case  [id number] DOB: Nov 29, 1970 DOA: 06/03/2016 PCP: Jettie Booze, NP  Outpatient Specialists: Unknown GI  Brief Narrative: Sherry Case is a 46 y.o. female with a history of obesity and PCOS who presented with sudden onset, constant, epigastric abdominal pain/pressure that began after several hours of fatigue. She had a few episodes of diarrhea and called EMS when she felt like she was going to pass out on the toilet. On arrival to the ED she had several episodes of emesis without blood. She initially presented afebrile but hypotensive, 96/50, and tachycardic, HR in low 100's with epigastric tenderness. Labs showed neutrophilic leukocytosis of [id number]. CT abd/pelvis was unremarkable. CXR was negative, clean catch UA showed 5-30 WBC/HPF and rare bacteria. wet prep negative. Flu PCR negative. ECG showed NSR and negative troponin. Blood and urine cultures were obtained, vanc/zosyn given along with sepsis IVF's, [facility] called to admit. Symptoms improved quickly with resolution of diarrhea prior to stool studies being obtained and tolerance of diet. Epigastric discomfort resolved. Lactate cleared from 3.45 to 1.3 and leukocytosis resolved over 48 hours from 29.5 to normal at 8 on 2/12. GI was consulted and recommended HIDA scan to evaluate biliary system. This is pending.   She reports a history of reflux symptoms but doesn't take any medications, and she's had chronic headaches for which she takes NSAIDs. No EtOH or smoking. 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 Finished steroid taper for laryngitis 2 weeks ago.   Assessment & Plan: Principal Problem:   SIRS (systemic inflammatory response syndrome) (HCC) Active Problems:   PCOS (polycystic ovarian syndrome)   Essential hypertension, benign  SIRS and epigastric pain: Uncertain underlying etiology. Resolved.  - Urine culture w/ < 10k colonies/mL. Blood  cultures from 2/11 with no growth. Will DC abx and monitor.   - GI consultation appreciated, HIDA scan planned today. If negative, considering EGD.  - Diet as tolerated after HIDA, and possibly NPO after midnight tonight if EGD planned as an inpatient. - Monitor leukocytosis daily - GI pathogen panel ordered but no diarrhea, will cancel this and empiric enteric precautions  HTN:  - Holding lisinopril for now  PCOS:  - Not restarting metformin (not taking this, so this isn't cause of lactate elevation)  Obesity: ?NAFLD, but LFTs wnl. - With hyperglycemia, check HbA1c.  - TSH 1.008.  DVT prophylaxis: Lovenox Code Status: Full Family Communication: None at bedside this AM Disposition Plan: Anticipate DC to home once GI work up complete and patient improved.   Consultants:   GI, Dr. Penelope Coop  Procedures:   None  Antimicrobials:  Vancomycin 2/10 >> 2/12  Zosyn 2/10 >>  2/12  Subjective: Symptoms of epigastric discomfort, vomiting and diarrhea have been resolved x24 hours. Still with moderate bilateral frontal retroorbital HA consistent with prior chronic headaches without photophobia, neck stiffness, or fever.  Objective: Vitals:   06/04/16 0451 06/04/16 1437 06/04/16 2005 06/05/16 0427  BP: 121/65 136/78 128/73 (!) 141/76  Pulse: (!) 106 (!) 105 94 75  Resp: 20 20 19 18   Temp: 98.6 F (37 C) 98.6 F (37 C) 98.6 F (37 C) 98.6 F (37 C)  TempSrc: Oral Oral Oral Oral  SpO2: 100% 100% 99% 96%  Weight:    98.7 kg (217 lb 9.5 oz)  Height:        Intake/Output Summary (Last 24 hours) at 06/05/16 0855 Last data filed at 06/05/16 0524  Gross per 24 hour  Intake             2160 ml  Output                0 ml  Net             2160 ml   Filed Weights   06/04/16 0327 06/05/16 0427  Weight: 99.8 kg (220 lb) 98.7 kg (217 lb 9.5 oz)    Examination: General exam: Pleasant, obese 46 y.o. female in no distress Respiratory system: Non-labored breathing room air. Clear to  auscultation bilaterally.  Cardiovascular system: Regular rate and rhythm. No murmur, rub, or gallop. No JVD, and no pedal edema. Gastrointestinal system: Abdomen soft, mild epigastric tenderness without rebound or guarding, non-distended, with normoactive bowel sounds. No organomegaly. +wide diastasis. Central nervous system: Alert and oriented. No focal neurological deficits. Extremities: Warm, no deformities Skin: No rashes, lesions no ulcers Psychiatry: Judgement and insight appear normal. Mood & affect appropriate.   Data Reviewed: I have personally reviewed following labs and imaging studies  CBC:  Recent Labs Lab 06/03/16 2331 06/04/16 0445 06/05/16 0447  WBC 29.5* 24.0* 8.0  NEUTROABS 26.5*  --   --   HGB 13.7 12.5 12.2  HCT 42.4 39.3 38.2  MCV 93.8 94.2 94.6  PLT 293 272 0000000   Basic Metabolic Panel:  Recent Labs Lab 06/03/16 2331 06/04/16 0445 06/05/16 0447  NA 136 137 140  K 3.9 3.7 3.5  CL 104 108 109  CO2 25 24 28   GLUCOSE 198* 210* 138*  BUN 15 14 6   CREATININE 0.77 0.85 0.63  CALCIUM 7.9* 7.1* 8.1*   GFR: Estimated Creatinine Clearance: 99.4 mL/min (by C-G formula based on SCr of 0.63 mg/dL). Liver Function Tests:  Recent Labs Lab 06/03/16 2331 06/05/16 0447  AST 18 12*  ALT 15 17  ALKPHOS 45 36*  BILITOT 0.7 0.4  PROT 6.7 6.2*  ALBUMIN 3.4* 3.2*    Recent Labs Lab 06/03/16 2331  LIPASE 22   No results for input(s): AMMONIA in the last 168 hours. Coagulation Profile: No results for input(s): INR, PROTIME in the last 168 hours. Cardiac Enzymes: No results for input(s): CKTOTAL, CKMB, CKMBINDEX, TROPONINI in the last 168 hours. BNP (last 3 results) No results for input(s): PROBNP in the last 8760 hours. HbA1C: No results for input(s): HGBA1C in the last 72 hours. CBG: No results for input(s): GLUCAP in the last 168 hours. Lipid Profile: No results for input(s): CHOL, HDL, LDLCALC, TRIG, CHOLHDL, LDLDIRECT in the last 72  hours. Thyroid Function Tests:  Recent Labs  06/05/16 0447  TSH 1.008   Anemia Panel: No results for input(s): VITAMINB12, FOLATE, FERRITIN, TIBC, IRON, RETICCTPCT in the last 72 hours. Urine analysis:    Component Value Date/Time   COLORURINE YELLOW 06/04/2016 0040   APPEARANCEUR HAZY (A) 06/04/2016 0040   LABSPEC 1.024 06/04/2016 0040   PHURINE 6.0 06/04/2016 0040   GLUCOSEU NEGATIVE 06/04/2016 0040   HGBUR NEGATIVE 06/04/2016 0040   BILIRUBINUR NEGATIVE 06/04/2016 0040   BILIRUBINUR - 02/03/2013 1459   KETONESUR 5 (A) 06/04/2016 0040   PROTEINUR 100 (A) 06/04/2016 0040   UROBILINOGEN 0.2 05/10/2013 1811   NITRITE NEGATIVE 06/04/2016 0040   LEUKOCYTESUR NEGATIVE 06/04/2016 0040   Recent Results (from the past 240 hour(s))  Urine culture     Status: Abnormal   Collection Time: 06/04/16 12:40 AM  Result Value Ref Range Status   Specimen Description URINE, RANDOM  Final   Special Requests NONE  Final   Culture (A)  Final    <10,000 COLONIES/mL INSIGNIFICANT GROWTH Performed at [facility], [place] [address], [place], [place] [address]    Report Status 06/05/2016 FINAL  Final  Wet prep, genital     Status: Abnormal   Collection Time: 06/04/16  1:37 AM  Result Value Ref Range Status   Yeast Wet Prep HPF POC NONE SEEN NONE SEEN Final   Trich, Wet Prep NONE SEEN NONE SEEN Final    Comment: Swab received with less than 0.5 mL of saline, saline added to specimen, interpret results with caution.   Clue Cells Wet Prep HPF POC NONE SEEN NONE SEEN Final   WBC, Wet Prep HPF POC RARE (A) NONE SEEN Final   Sperm NONE SEEN  Final      Radiology Studies: Ct Abdomen Pelvis W Contrast  Result Date: 06/04/2016 CLINICAL DATA:  46 y/o  F; epigastric pain with nausea and vomiting. EXAM: CT ABDOMEN AND PELVIS WITH CONTRAST TECHNIQUE: Multidetector CT imaging of the abdomen and pelvis was performed using the standard protocol following bolus administration of intravenous contrast.  CONTRAST:  114mL ISOVUE-300 IOPAMIDOL (ISOVUE-300) INJECTION 61% COMPARISON:  05/10/2013 CT abdomen and pelvis FINDINGS: Lower chest: No acute abnormality. Hepatobiliary: No focal liver abnormality is seen. Hepatic steatosis. No gallstones, gallbladder wall thickening, or biliary dilatation. Pancreas: Unremarkable. No pancreatic ductal dilatation or surrounding inflammatory changes. Spleen: Normal in size without focal abnormality. Adrenals/Urinary Tract: Adrenal glands are unremarkable. Subcentimeter nonspecific lucencies and right kidney are likely cysts. Kidneys are otherwise normal, without renal calculi, focal lesion, or hydronephrosis. Bladder is unremarkable. Stomach/Bowel: Stomach is within normal limits. Appendix appears normal. No evidence of bowel wall thickening, distention, or inflammatory changes. Vascular/Lymphatic: Aortic atherosclerosis with mild infrarenal calcification. No enlarged abdominal or pelvic lymph nodes. Reproductive: Status post hysterectomy. No adnexal masses. Other: No abdominal wall hernia or abnormality. No abdominopelvic ascites. Musculoskeletal: No acute or significant osseous findings. 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 New in IMPRESSION: 1. No acute process identified. 2. Mild aortic atherosclerosis. 3. 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 Electronically Signed   By: Kristine Garbe M.D.   On: 06/04/2016 01:17   Dg Abdomen Acute W/chest  Result Date: 06/04/2016 CLINICAL DATA:  Nausea diarrhea and abdominal pain EXAM: DG ABDOMEN ACUTE W/ 1V CHEST COMPARISON:  CT 06/04/2016 FINDINGS: Single-view chest demonstrates minimal basilar atelectasis on the left. Heart size normal. No pneumothorax. Supine and upright views of the abdomen demonstrate no free air. Moderate to marked scoliosis of the thoracolumbar spine. Nonobstructed bowel-gas pattern. Residual contrast material in the collecting systems and bladder. IMPRESSION: Minimal left basilar atelectasis.  Nonobstructed gas pattern. Residual contrast material in the collecting systems of the kidneys and the bladder. Electronically Signed   By: Donavan Foil M.D.   On: 06/04/2016 01:46    Scheduled Meds: . enoxaparin (LOVENOX) injection  40 mg Subcutaneous Daily  . piperacillin-tazobactam (ZOSYN)  IV  3.375 g Intravenous Q8H  . sodium chloride flush  3 mL Intravenous Q12H  . vancomycin  1,000 mg Intravenous Q8H   Continuous Infusions:   LOS: 1 day   Time spent: 25 minutes.  Vance Gather, MD [facility] Pager [phone]  If 7PM-7AM, please contact night-coverage [email or web address] Password [facility] 06/05/2016, 8:55 AM

## 2016-06-05 NOTE — Progress Notes (Signed)
[facility] PROGRESS NOTE Subjective 46 year old who was admitted after having fairly sudden onset of epigastric subsequent pain radiating to her chest. She had some diarrhea and felt weak and dizzy and nearly passed out causing heart call EMS. She had a lot of vomiting. She had an initial white count of 29,000 with unremarkable CT scan. Specifically no gallstones were seen. No abnormality was identified to explain her symptoms. Hepatobiliary scan with ejection fraction was obtained today and those results are still pending. She states that she feels remarkably better and is taking clear liquids. She's slightly uncomfortable feels 90% better  Objective: Vital signs in last 24 hours: Temp:  [98.6 F (37 C)] 98.6 F (37 C) (02/12 0427) Pulse Rate:  [75-94] 75 (02/12 0427) Resp:  [18-19] 18 (02/12 0427) BP: (128-141)/(73-76) 141/76 (02/12 0427) SpO2:  [96 %-99 %] 96 % (02/12 0427) Weight:  [98.4 kg (217 lb)-98.7 kg (217 lb 9.5 oz)] 98.4 kg (217 lb) (02/12 1300) Last BM Date: 06/04/16  Intake/Output from previous day: 02/11 0701 - 02/12 0700 In: 2160 [P.O.:1660; IV Piggyback:500] Out: -  Intake/Output this shift: No intake/output data recorded.  PE: General-- obese white female no acute distress  Lungs-- clear Abdomen-- obese but soft and nontender  Lab Results:  Recent Labs  06/03/16 2331 06/04/16 0445 06/05/16 0447  WBC 29.5* 24.0* 8.0  HGB 13.7 12.5 12.2  HCT 42.4 39.3 38.2  PLT 293 272 243   BMET  Recent Labs  06/03/16 2331 06/04/16 0445 06/05/16 0447  NA 136 137 140  K 3.9 3.7 3.5  CL 104 108 109  CO2 25 24 28   CREATININE 0.77 0.85 0.63   LFT  Recent Labs  06/03/16 2331 06/05/16 0447  PROT 6.7 6.2*  AST 18 12*  ALT 15 17  ALKPHOS 45 36*  BILITOT 0.7 0.4   PT/INR No results for input(s): LABPROT, INR in the last 72 hours. PANCREAS  Recent Labs  06/03/16 2331  LIPASE 22         Studies/Results: Ct Abdomen Pelvis W  Contrast  Result Date: 06/04/2016 CLINICAL DATA:  46 y/o  F; epigastric pain with nausea and vomiting. EXAM: CT ABDOMEN AND PELVIS WITH CONTRAST TECHNIQUE: Multidetector CT imaging of the abdomen and pelvis was performed using the standard protocol following bolus administration of intravenous contrast. CONTRAST:  134mL ISOVUE-300 IOPAMIDOL (ISOVUE-300) INJECTION 61% COMPARISON:  05/10/2013 CT abdomen and pelvis FINDINGS: Lower chest: No acute abnormality. Hepatobiliary: No focal liver abnormality is seen. Hepatic steatosis. No gallstones, gallbladder wall thickening, or biliary dilatation. Pancreas: Unremarkable. No pancreatic ductal dilatation or surrounding inflammatory changes. Spleen: Normal in size without focal abnormality. Adrenals/Urinary Tract: Adrenal glands are unremarkable. Subcentimeter nonspecific lucencies and right kidney are likely cysts. Kidneys are otherwise normal, without renal calculi, focal lesion, or hydronephrosis. Bladder is unremarkable. Stomach/Bowel: Stomach is within normal limits. Appendix appears normal. No evidence of bowel wall thickening, distention, or inflammatory changes. Vascular/Lymphatic: Aortic atherosclerosis with mild infrarenal calcification. No enlarged abdominal or pelvic lymph nodes. Reproductive: Status post hysterectomy. No adnexal masses. Other: No abdominal wall hernia or abnormality. No abdominopelvic ascites. Musculoskeletal: No acute or significant osseous findings. 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 New in IMPRESSION: 1. No acute process identified. 2. Mild aortic atherosclerosis. 3. 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 Electronically Signed   By: Kristine Garbe M.D.   On: 06/04/2016 01:17   Dg Abdomen Acute W/chest  Result Date: 06/04/2016 CLINICAL DATA:  Nausea diarrhea and abdominal pain EXAM: DG ABDOMEN ACUTE W/  1V CHEST COMPARISON:  CT 06/04/2016 FINDINGS: Single-view chest demonstrates minimal basilar atelectasis on the left.  Heart size normal. No pneumothorax. Supine and upright views of the abdomen demonstrate no free air. Moderate to marked scoliosis of the thoracolumbar spine. Nonobstructed bowel-gas pattern. Residual contrast material in the collecting systems and bladder. IMPRESSION: Minimal left basilar atelectasis. Nonobstructed gas pattern. Residual contrast material in the collecting systems of the kidneys and the bladder. Electronically Signed   By: Donavan Foil M.D.   On: 06/04/2016 01:46    Medications: I have reviewed the patient's current medications.  Assessment/Plan: 1. Epigastric pain/elevated white count. CT scan T scan does not reveal any obvious etiology of her symptoms. Her WBC is returned to normal in her LFTs were never elevated. Hepatobiliary scan pending but this does not clearly appear to be related to biliary disease. At this point I would continue clear liquids close monitoring. I would advance diet in the morning if she is still doing fine.    Marce Charlesworth JR,Jaecob Lowden L 06/05/2016, 3:54 PM  This note was created using voice recognition software. Minor errors may Have occurred unintentionally.  Pager: [phone] If no answer or after hours call [phone]

## 2016-06-06 DIAGNOSIS — R1013 Epigastric pain: Secondary | ICD-10-CM

## 2016-06-06 DIAGNOSIS — E282 Polycystic ovarian syndrome: Secondary | ICD-10-CM | POA: Diagnosis not present

## 2016-06-06 DIAGNOSIS — R109 Unspecified abdominal pain: Secondary | ICD-10-CM

## 2016-06-06 DIAGNOSIS — I10 Essential (primary) hypertension: Secondary | ICD-10-CM | POA: Diagnosis not present

## 2016-06-06 LAB — CBC
HEMATOCRIT: 41.1 % (ref 36.0–46.0)
Hemoglobin: 13.8 g/dL (ref 12.0–15.0)
MCH: 31.4 pg (ref 26.0–34.0)
MCHC: 33.6 g/dL (ref 30.0–36.0)
MCV: 93.6 fL (ref 78.0–100.0)
PLATELETS: 277 10*3/uL (ref 150–400)
RBC: 4.39 MIL/uL (ref 3.87–5.11)
RDW: 13.6 % (ref 11.5–15.5)
WBC: 11.3 10*3/uL — AB (ref 4.0–10.5)

## 2016-06-06 LAB — HEMOGLOBIN A1C
HEMOGLOBIN A1C: 6.7 % — AB (ref 4.8–5.6)
MEAN PLASMA GLUCOSE: 146 mg/dL

## 2016-06-06 LAB — CREATININE, SERUM
Creatinine, Ser: 0.64 mg/dL (ref 0.44–1.00)
GFR calc non Af Amer: >60 mL/min (ref >60)

## 2016-06-06 NOTE — Discharge Summary (Signed)
Physician Discharge Summary  Sherry Case [id number] DOB: 02/01/1971 DOA: 06/03/2016  PCP: Sherry Booze, NP  Admit date: 06/03/2016 Discharge date: 06/06/2016  Admitted From: Home Disposition: Home   Recommendations for Outpatient Follow-up:  1. Follow up with PCP and [facility] in 1-2 weeks 2. Consider ongoing work up including repeat HIDA scan, GB EF 18% w/o pain with CCK administration.  3. Ongoing care for history of prediabetes/PCOS. HbA1c 6.7% and patient has not been taking metformin.  4. Please follow up on the following pending results: Blood cultures (06/04/2016) NGTD at discharge  Home Health: None Equipment/Devices: None Discharge Condition: Stable CODE STATUS: Full Diet recommendation: Carbohydrate limited  Brief/Interim Summary: Sherry Meredithis a 46 y.o.femalewith a history of obesity and PCOS who presented with sudden onset, constant, epigastric abdominal pain/pressure that began after several hours of fatigue. She had a few episodes of diarrhea and called EMS when she felt like she was going to pass out on the toilet. On arrival to the ED she had several episodes of emesis without blood. She initially presented afebrile but hypotensive, 96/50, and tachycardic, HR in low 100's with epigastric tenderness. Labs showed neutrophilic leukocytosis of [id number]. CT abd/pelvis was unremarkable. CXR was negative, clean catch UA showed 5-30 WBC/HPF and rare bacteria. wet prep negative. Flu PCR negative. ECG showed NSR and negative troponin. Blood and urine cultures were obtained, vanc/zosyn given along with sepsis IVF's, TRH called to admit. Symptoms improved quickly with resolution of diarrhea prior to stool studies being obtained and tolerance of diet. Epigastric discomfort resolved. Lactate cleared from 3.45 to 1.3 and leukocytosis resolved over 48 hours from 29.5 to normal at 8 on 2/12. GI was consulted and recommended HIDA scan which showed GB EF 18% w/o pain with CCK  administration. Surgery was consulted and recommended repeat scan in 2 - 3 weeks with potential re-scan. She tolerated a regular diet and was discharged with follow up.  Discharge Diagnoses:  Principal Problem:   SIRS (systemic inflammatory response syndrome) (HCC) Active Problems:   PCOS (polycystic ovarian syndrome)   Essential hypertension, benign   Abdominal pain  SIRS and epigastric pain: Uncertain underlying etiology ?biliary dyskinesia. Resolved.  - Urine culture w/ < 10k colonies/mL. Blood cultures from 2/11 with no growth. Abx were initially started but held with negative cultures.  - GI consultation appreciated, HIDA scan showed decreased EF  - Diet tolerated - Monitor leukocytosis at follow up - GI pathogen panel ordered but no diarrhea  HTN:  - Held lisinopril while inpatient  PCOS:  - Not restarting metformin (not taking this, so this isn't cause of lactate elevation)  Obesity: ?NAFLD, but LFTs wnl. HbA1c 6.7% - TSH 1.008.  Discharge Instructions Discharge Instructions    Discharge instructions    Complete by:  As directed    You were admitted with abdominal pain, nausea, vomiting and diarrhea as well as nearly passing out. Evaluation did not show a definite cause of these symptoms, though your gallbladder may be emptying abnormally which could be the cause of these symptoms. Because you are better, able to tolerate a diet, you are stable for discharge with the following recommendations:  - No new medications were added, but you should follow up with your PCP and Dr. Oletta Lamas (GI) in the next couple weeks.  - If your symptoms return, seek medical care sooner     Allergies as of 06/06/2016   No Known Allergies     Medication List    TAKE these medications  metFORMIN 850 MG tablet Commonly known as:  GLUCOPHAGE Take 1 tablet (850 mg total) by mouth 2 (two) times daily with a meal. Start after completing first presciption   PRINIVIL 10 MG tablet Generic  drug:  lisinopril Take 10 mg by mouth daily.      Follow-up Information    EDWARDS JR,JAMES L, MD. Schedule an appointment as soon as possible for a visit in 2 week(s).   Specialty:  Gastroenterology Contact information: [id number] [place] [address] [phone]        Odis Hollingshead, MD .   Specialty:  General Surgery Contact information: [address] [place] [address] [phone]        WHITE, MARSHA L, NP. Schedule an appointment as soon as possible for a visit.   Specialty:  Family Medicine Contact information: [place] [address] [place] [address] [phone]          No Known Allergies  Consultations:  GI, Dr. Oletta Lamas  Surgery, Dr. Zella Richer  Procedures/Studies: Ct Abdomen Pelvis W Contrast  Result Date: 06/04/2016 CLINICAL DATA:  46 y/o  F; epigastric pain with nausea and vomiting. EXAM: CT ABDOMEN AND PELVIS WITH CONTRAST TECHNIQUE: Multidetector CT imaging of the abdomen and pelvis was performed using the standard protocol following bolus administration of intravenous contrast. CONTRAST:  124mL ISOVUE-300 IOPAMIDOL (ISOVUE-300) INJECTION 61% COMPARISON:  05/10/2013 CT abdomen and pelvis FINDINGS: Lower chest: No acute abnormality. Hepatobiliary: No focal liver abnormality is seen. Hepatic steatosis. No gallstones, gallbladder wall thickening, or biliary dilatation. Pancreas: Unremarkable. No pancreatic ductal dilatation or surrounding inflammatory changes. Spleen: Normal in size without focal abnormality. Adrenals/Urinary Tract: Adrenal glands are unremarkable. Subcentimeter nonspecific lucencies and right kidney are likely cysts. Kidneys are otherwise normal, without renal calculi, focal lesion, or hydronephrosis. Bladder is unremarkable. Stomach/Bowel: Stomach is within normal limits. Appendix appears normal. No evidence of bowel wall thickening, distention, or inflammatory changes. Vascular/Lymphatic: Aortic  atherosclerosis with mild infrarenal calcification. No enlarged abdominal or pelvic lymph nodes. Reproductive: Status post hysterectomy. No adnexal masses. Other: No abdominal wall hernia or abnormality. No abdominopelvic ascites. Musculoskeletal: No acute or significant osseous findings. 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 New in IMPRESSION: 1. No acute process identified. 2. Mild aortic atherosclerosis. 3. 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 Electronically Signed   By: Kristine Garbe M.D.   On: 06/04/2016 01:17   Nm Hepato W/eject Fract  Result Date: 06/05/2016 CLINICAL DATA:  Abdominal pain, nausea, vomiting, shortness of breath, chest pain EXAM: NUCLEAR MEDICINE HEPATOBILIARY IMAGING WITH GALLBLADDER EF TECHNIQUE: Sequential images of the abdomen were obtained out to 60 minutes following intravenous administration of radiopharmaceutical. After slow intravenous infusion of 2.0 micrograms Cholecystokinin, gallbladder ejection fraction was determined. RADIOPHARMACEUTICALS:  5.1 mCi Tc-3m Choletec IV COMPARISON:  None FINDINGS: Normal tracer extraction from bloodstream indicating normal hepatocellular function. Normal excretion of tracer into biliary tree. Gallbladder visualized at 10 min. Small bowel visualized at 6 min. No hepatic retention of tracer. Subjectively decreased emptying of tracer from gallbladder following CCK administration. Calculated gallbladder ejection fraction is 18%, abnormally low. Patient reported no symptoms following CCK administration. Normal gallbladder ejection fraction following CCK stimulation is greater than 40% at 1 hour. IMPRESSION: Patent biliary tree. Abnormal gallbladder response to CCK stimulation with a decreased gallbladder ejection fraction of 18%. Electronically Signed   By: Lavonia Dana M.D.   On: 06/05/2016 16:15   Dg Abdomen Acute W/chest  Result Date: 06/04/2016 CLINICAL DATA:  Nausea diarrhea  and abdominal pain EXAM: DG ABDOMEN ACUTE W/ 1V  CHEST COMPARISON:  CT 06/04/2016 FINDINGS: Single-view chest demonstrates minimal basilar atelectasis on the left. Heart size normal. No pneumothorax. Supine and upright views of the abdomen demonstrate no free air. Moderate to marked scoliosis of the thoracolumbar spine. Nonobstructed bowel-gas pattern. Residual contrast material in the collecting systems and bladder. IMPRESSION: Minimal left basilar atelectasis. Nonobstructed gas pattern. Residual contrast material in the collecting systems of the kidneys and the bladder. Electronically Signed   By: Donavan Foil M.D.   On: 06/04/2016 01:46   Subjective: Pt with resolved symptoms. No abdominal pain after eating solid lunch. Very mild nausea without emesis or requiring treatment. No fever.   Discharge Exam: Vitals:   06/05/16 2103 06/06/16 0507  BP: (!) 158/96 136/77  Pulse: 87 85  Resp: 18 18  Temp: 98.6 F (37 C) 98.4 F (36.9 C)   General: Pt is alert, awake, not in acute distress Cardiovascular: RRR, S1/S2 +, no rubs, no gallops Respiratory: CTA bilaterally, no wheezing, no rhonchi Abdominal: Soft, NT, ND, bowel sounds + Extremities: No edema, no cyanosis  The results of significant diagnostics from this hospitalization (including imaging, microbiology, ancillary and laboratory) are listed below for reference.    Labs: Basic Metabolic Panel:  Recent Labs Lab 06/03/16 2331 06/04/16 0445 06/05/16 0447 06/06/16 0511  NA 136 137 140  --   K 3.9 3.7 3.5  --   CL 104 108 109  --   CO2 25 24 28   --   GLUCOSE 198* 210* 138*  --   BUN 15 14 6   --   CREATININE 0.77 0.85 0.63 0.64  CALCIUM 7.9* 7.1* 8.1*  --    Liver Function Tests:  Recent Labs Lab 06/03/16 2331 06/05/16 0447  AST 18 12*  ALT 15 17  ALKPHOS 45 36*  BILITOT 0.7 0.4  PROT 6.7 6.2*  ALBUMIN 3.4* 3.2*    Recent Labs Lab 06/03/16 2331  LIPASE 22   CBC:  Recent Labs Lab 06/03/16 2331 06/04/16 0445 06/05/16 0447 06/06/16 0511  WBC 29.5* 24.0*  8.0 11.3*  NEUTROABS 26.5*  --   --   --   HGB 13.7 12.5 12.2 13.8  HCT 42.4 39.3 38.2 41.1  MCV 93.8 94.2 94.6 93.6  PLT 293 272 243 277   Hgb A1c  Recent Labs  06/05/16 0447  HGBA1C 6.7*   Thyroid function studies  Recent Labs  06/05/16 0447  TSH 1.008   Urinalysis    Component Value Date/Time   COLORURINE YELLOW 06/04/2016 0040   APPEARANCEUR HAZY (A) 06/04/2016 0040   LABSPEC 1.024 06/04/2016 0040   PHURINE 6.0 06/04/2016 0040   GLUCOSEU NEGATIVE 06/04/2016 0040   HGBUR NEGATIVE 06/04/2016 0040   BILIRUBINUR NEGATIVE 06/04/2016 0040   BILIRUBINUR - 02/03/2013 1459   KETONESUR 5 (A) 06/04/2016 0040   PROTEINUR 100 (A) 06/04/2016 0040   UROBILINOGEN 0.2 05/10/2013 1811   NITRITE NEGATIVE 06/04/2016 0040   LEUKOCYTESUR NEGATIVE 06/04/2016 0040    Microbiology Recent Results (from the past 240 hour(s))  Urine culture     Status: Abnormal   Collection Time: 06/04/16 12:40 AM  Result Value Ref Range Status   Specimen Description URINE, RANDOM  Final   Special Requests NONE  Final   Culture (A)  Final    <10,000 COLONIES/mL INSIGNIFICANT GROWTH Performed at [facility], [address], [place], [place] [address]    Report Status 06/05/2016 FINAL  Final  Wet prep, genital     Status: Abnormal   Collection Time: 06/04/16  1:37 AM  Result Value Ref Range Status   Yeast Wet Prep HPF POC NONE SEEN NONE SEEN Final   Trich, Wet Prep NONE SEEN NONE SEEN Final    Comment: Swab received with less than 0.5 mL of saline, saline added to specimen, interpret results with caution.   Clue Cells Wet Prep HPF POC NONE SEEN NONE SEEN Final   WBC, Wet Prep HPF POC RARE (A) NONE SEEN Final   Sperm NONE SEEN  Final  Blood culture (routine x 2)     Status: None (Preliminary result)   Collection Time: 06/04/16  3:04 AM  Result Value Ref Range Status   Specimen Description BLOOD LEFT ANTECUBITAL  Final   Special Requests BOTTLES DRAWN AEROBIC AND ANAEROBIC 5CC EACH  Final    Culture   Final    NO GROWTH 2 DAYS Performed at [facility], [place] [address] [place], [place] [address]    Report Status PENDING  Incomplete  Blood culture (routine x 2)     Status: None (Preliminary result)   Collection Time: 06/04/16  3:05 AM  Result Value Ref Range Status   Specimen Description BLOOD BLOOD RIGHT FOREARM  Final   Special Requests BOTTLES DRAWN AEROBIC AND ANAEROBIC 5CC EACH  Final   Culture   Final    NO GROWTH 2 DAYS Performed at [facility], [place] [address], [place], [place] [address]    Report Status PENDING  Incomplete   Lab Results  Component Value Date   HGBA1C 6.7 (H) 06/05/2016    Time coordinating discharge: Approximately 40 minutes  Vance Gather, MD  [facility] 06/06/2016, 5:07 PM Pager [phone]  If 7PM-7AM, please contact night-coverage [email or web address] Password [email or web address]

## 2016-06-06 NOTE — Consult Note (Signed)
Reason for Consult:  Biliary dyskinesia Referring Physician:  Dr. Novella Rob is an 46 y.o. female.  HPI:  She had the onset of some nausea Saturday morning. She had not had anything to eat yet. She ate some pretzels. She still had some nausea and became very fatigued.  She took a nap that afternoon and awoke little bit lightheaded still had some nausea. She had some diarrhea.  She fixed a pork roast with rice and 8 at around 6 PM. At 8 PM, she developed some acute epigastric pain and diarrhea. This was then followed by nausea, sweats, shortness of breath, and lightheadedness. She presented to the hospital where she is noted to have a significant leukocytosis and was admitted with a diagnosis of sepsis. Liver function tests were not elevated. Lipase was normal. A CT scan was performed but did not demonstrate any obvious intra-abdominal pathology to explain her pain and leukocytosis.  She was admitted to the hospital and abruptly got better. CT did not show any gallstones. GI consult was obtained. A nuclear medicine hepatobiliary scan was performed. This demonstrated visualization of the gallbladder within 10 minutes. It also demonstrated a low gallbladder ejection fraction of 18%. Yesterday she felt well and her white count was normal. She was started on a liquid diet. She states she is still has a lightheaded feeling when she gets up and some diarrhea. Minimal to no abdominal pain now.  Past Medical History:  Diagnosis Date  . Anemia due to chronic blood loss 01/12/2014  . Colon polyps 2014   history  hyperplastic  . GERD (gastroesophageal reflux disease)    occasional - no meds - diet controlled  . Headache(784.0)    otc med prn  . Hypertension    borderline - no meds  . PCOS (polycystic ovarian syndrome)    tx with metformin  . SVD (spontaneous vaginal delivery)    x 3    Past Surgical History:  Procedure Laterality Date  . BREAST LUMPECTOMY Right 20 years ago   Benign Enlarged  Lymph Node  . DILATION AND CURETTAGE OF UTERUS     x2, with SABs  . EXTERNAL CEPHALIC VERSION  3/47/4259   version for SVD w/ epidural  . FINGER SURGERY  25 years ago   right hand - pointer finger  . FOOT SURGERY  2013   old fracture bone piece removed - right foot  . HERNIA REPAIR    . LAPAROSCOPIC ASSISTED VAGINAL HYSTERECTOMY Bilateral 01/12/2014   Procedure: LAPAROSCOPIC ASSISTED VAGINAL HYSTERECTOMY with bilateral salpingectomy ;  Surgeon: Lyman Speller, MD;  Location: [facility];  Service: Gynecology;  Laterality: Bilateral;  . TONSILLECTOMY AND ADENOIDECTOMY  32 year ago  . WISDOM TOOTH EXTRACTION      Family History  Problem Relation Age of Onset  . Colon cancer Mother 59    stage 1  . Diabetes Mother   . Lymphoma Paternal Grandmother   . Breast cancer Cousin     Social History:  reports that she has never smoked. She has never used smokeless tobacco. She reports that she does not drink alcohol or use drugs.  Allergies: No Known Allergies  . Current Facility-Administered Medications:  .  acetaminophen (TYLENOL) tablet 650 mg, 650 mg, Oral, Q4H PRN, Patrecia Pour, MD, 650 mg at 06/05/16 0836 .  enoxaparin (LOVENOX) injection 40 mg, 40 mg, Subcutaneous, Daily, Etta Quill, DO, 40 mg at 06/06/16 1021 .  gi cocktail (Maalox,Lidocaine,Donnatal), 30 mL, Oral, TID PRN,  Etta Quill, DO .  ondansetron [facility]) injection 4 mg, 4 mg, Intravenous, Q6H PRN, Etta Quill, DO .  sodium chloride flush (NS) 0.9 % injection 3 mL, 3 mL, Intravenous, Q12H, Jared M Gardner, DO, 3 mL at 06/05/16 2130   Results for orders placed or performed during the hospital encounter of 06/03/16 (from the past 48 hour(s))  CBC     Status: None   Collection Time: 06/05/16  4:47 AM  Result Value Ref Range   WBC 8.0 4.0 - 10.5 K/uL   RBC 4.04 3.87 - 5.11 MIL/uL   Hemoglobin 12.2 12.0 - 15.0 g/dL   HCT 38.2 36.0 - 46.0 %   MCV 94.6 78.0 - 100.0 fL   MCH 30.2 26.0 - 34.0 pg   MCHC 31.9 30.0  - 36.0 g/dL   RDW 14.0 11.5 - 15.5 %   Platelets 243 150 - 400 K/uL  Comprehensive metabolic panel     Status: Abnormal   Collection Time: 06/05/16  4:47 AM  Result Value Ref Range   Sodium 140 135 - 145 mmol/L   Potassium 3.5 3.5 - 5.1 mmol/L   Chloride 109 101 - 111 mmol/L   CO2 28 22 - 32 mmol/L   Glucose, Bld 138 (H) 65 - 99 mg/dL   BUN 6 6 - 20 mg/dL   Creatinine, Ser 0.63 0.44 - 1.00 mg/dL   Calcium 8.1 (L) 8.9 - 10.3 mg/dL   Total Protein 6.2 (L) 6.5 - 8.1 g/dL   Albumin 3.2 (L) 3.5 - 5.0 g/dL   AST 12 (L) 15 - 41 U/L   ALT 17 14 - 54 U/L   Alkaline Phosphatase 36 (L) 38 - 126 U/L   Total Bilirubin 0.4 0.3 - 1.2 mg/dL   GFR calc non Af Amer >60 >60 mL/min   GFR calc Af Amer >60 >60 mL/min    Comment: (NOTE) The eGFR has been calculated using the CKD EPI equation. This calculation has not been validated in all clinical situations. eGFR's persistently <60 mL/min signify possible Chronic Kidney Disease.    Anion gap 3 (L) 5 - 15  Hemoglobin A1c     Status: Abnormal   Collection Time: 06/05/16  4:47 AM  Result Value Ref Range   Hgb A1c MFr Bld 6.7 (H) 4.8 - 5.6 %    Comment: (NOTE)         Pre-diabetes: 5.7 - 6.4         Diabetes: >6.4         Glycemic control for adults with diabetes: <7.0    Mean Plasma Glucose 146 mg/dL    Comment: (NOTE) Performed At: [facility] [place], [place] [id number] Lindon Romp MD [id number]   TSH     Status: None   Collection Time: 06/05/16  4:47 AM  Result Value Ref Range   TSH 1.008 0.350 - 4.500 uIU/mL    Comment: Performed by a 3rd Generation assay with a functional sensitivity of <=0.01 uIU/mL.  Creatinine, serum     Status: None   Collection Time: 06/06/16  5:11 AM  Result Value Ref Range   Creatinine, Ser 0.64 0.44 - 1.00 mg/dL   GFR calc non Af Amer >60 >60 mL/min   GFR calc Af Amer >60 >60 mL/min    Comment: (NOTE) The eGFR has been calculated using the CKD EPI equation. This calculation  has not been validated in all clinical situations. eGFR's persistently <60 mL/min signify possible  Chronic Kidney Disease.   CBC     Status: Abnormal   Collection Time: 06/06/16  5:11 AM  Result Value Ref Range   WBC 11.3 (H) 4.0 - 10.5 K/uL   RBC 4.39 3.87 - 5.11 MIL/uL   Hemoglobin 13.8 12.0 - 15.0 g/dL   HCT 41.1 36.0 - 46.0 %   MCV 93.6 78.0 - 100.0 fL   MCH 31.4 26.0 - 34.0 pg   MCHC 33.6 30.0 - 36.0 g/dL   RDW 13.6 11.5 - 15.5 %   Platelets 277 150 - 400 K/uL    Nm Hepato W/eject Fract  Result Date: 06/05/2016 CLINICAL DATA:  Abdominal pain, nausea, vomiting, shortness of breath, chest pain EXAM: NUCLEAR MEDICINE HEPATOBILIARY IMAGING WITH GALLBLADDER EF TECHNIQUE: Sequential images of the abdomen were obtained out to 60 minutes following intravenous administration of radiopharmaceutical. After slow intravenous infusion of 2.0 micrograms Cholecystokinin, gallbladder ejection fraction was determined. RADIOPHARMACEUTICALS:  5.1 mCi Tc-26mCholetec IV COMPARISON:  None FINDINGS: Normal tracer extraction from bloodstream indicating normal hepatocellular function. Normal excretion of tracer into biliary tree. Gallbladder visualized at 10 min. Small bowel visualized at 6 min. No hepatic retention of tracer. Subjectively decreased emptying of tracer from gallbladder following CCK administration. Calculated gallbladder ejection fraction is 18%, abnormally low. Patient reported no symptoms following CCK administration. Normal gallbladder ejection fraction following CCK stimulation is greater than 40% at 1 hour. IMPRESSION: Patent biliary tree. Abnormal gallbladder response to CCK stimulation with a decreased gallbladder ejection fraction of 18%. Electronically Signed   By: MLavonia DanaM.D.   On: 06/05/2016 16:15    Review of Systems  Constitutional: Positive for diaphoresis.  Respiratory: Positive for shortness of breath.   Cardiovascular: Positive for chest pain.  Gastrointestinal: Positive  for abdominal pain, diarrhea and nausea.  Musculoskeletal: Negative for back pain.  Neurological: Positive for weakness.   Blood pressure 136/77, pulse 85, temperature 98.4 F (36.9 C), temperature source Oral, resp. rate 18, height 5' 3"  (1.6 m), weight 98.4 kg (217 lb), last menstrual period 09/05/2013, SpO2 99 %. Physical Exam  Constitutional: No distress.  Obese female.  HENT:  Head: Normocephalic and atraumatic.  Nose: Nose normal.  Eyes: Conjunctivae and EOM are normal. No scleral icterus.  Cardiovascular: Normal rate and regular rhythm.   GI: Soft. She exhibits no mass. There is tenderness (mild and epigastrium). There is no guarding.  No hernia.  Neurological: She is alert.  Skin: Skin is warm and dry.  No jaundice.  Psychiatric: She has a normal mood and affect. Her behavior is normal.    Assessment/Plan: Nausea with some diarrhea followed by severe epigastric pain and more diarrhea with leukocytosis, normal liver function tests. No evidence for acute cholecystitis. Hepatobiliary scan demonstrates biliary dyskinesia but she had no pain with the CCK injection. It could be that she had an acute viral illness and secondary gallbladder dysfunction due to this. I do not feel like she needs urgent cholecystectomy.  Recommendation: Advance diet. If this is well tolerated, would be glad to see her back in the office in 2 or 3 weeks and see how she is doing and potentially repeat the hepatobiliary scan. If she gets recurrence of the same pain with the solid diet, would recommend upper endoscopy and if unremarkable would proceed then to cholecystectomy. Delsa Walder J 06/06/2016, 10:21 AM

## 2016-06-06 NOTE — Care Management Note (Signed)
Case Management Note  Patient Details  Name: Sherry Case MRN: [id number] Date of Birth: 06/05/1970  Subjective/Objective:     Pt admitted with SIRS               Action/Plan: Plan to D/C home at discharge   Expected Discharge Date:  06/06/16               Expected Discharge Plan:  Home/Self Care  In-House Referral:     Discharge planning Services  CM Consult  Post Acute Care Choice:    Choice offered to:     DME Arranged:    DME Agency:     HH Arranged:    [place] Agency:     Status of Service:  In process, will continue to follow  If discussed at Long Length of Stay Meetings, dates discussed:    Additional CommentsPurcell Mouton, RN 06/06/2016, 2:49 PM

## 2016-06-06 NOTE — Discharge Instructions (Signed)
Strict lowfat diet.  Call [phone] for appointment with Dr. Zella Richer (surgeon) to check on gallbladder again.

## 2016-06-09 LAB — CULTURE, BLOOD (ROUTINE X 2)
Culture: NO GROWTH
Culture: NO GROWTH

## 2016-07-09 LAB — HM DIABETES EYE EXAM

## 2016-08-09 NOTE — Progress Notes (Deleted)
Patient ID: Sherry Case, female   DOB: [birth year], 46 y.o.   MRN: [id number] 45 y.o. [id number] MarriedCaucasianF here for annual exam.    Patient's last menstrual period was 09/05/2013.          Sexually active: {yes no:314532}  The current method of family planning is status post hysterectomy.    Exercising: {yes no:314532}  {types:19826} Smoker:  no  Health Maintenance: Pap: 02-03-13 Neg:Neg HR HPV; 06-21-11 Neg:Neg HR HPV History of abnormal Pap:  no MMG: ?? 06/17/13 3D/Density C/Neg/BIRADS:TBC Colonoscopy:  *** BMD:   *** TDaP:  01/2015 Pneumonia vaccine(s):  *** Zostavax:   *** Hep C testing: *** Screening Labs: ***, Hb today: ***, Urine today: ***   reports that she has never smoked. She has never used smokeless tobacco. She reports that she does not drink alcohol or use drugs.  Past Medical History:  Diagnosis Date  . Anemia due to chronic blood loss 01/12/2014  . Colon polyps 2014   history  hyperplastic  . GERD (gastroesophageal reflux disease)    occasional - no meds - diet controlled  . Headache(784.0)    otc med prn  . Hypertension    borderline - no meds  . PCOS (polycystic ovarian syndrome)    tx with metformin  . SVD (spontaneous vaginal delivery)    x 3    Past Surgical History:  Procedure Laterality Date  . BREAST LUMPECTOMY Right 20 years ago   Benign Enlarged Lymph Node  . DILATION AND CURETTAGE OF UTERUS     x2, with SABs  . EXTERNAL CEPHALIC VERSION  2/53/6644   version for SVD w/ epidural  . FINGER SURGERY  25 years ago   right hand - pointer finger  . FOOT SURGERY  2013   old fracture bone piece removed - right foot  . HERNIA REPAIR    . LAPAROSCOPIC ASSISTED VAGINAL HYSTERECTOMY Bilateral 01/12/2014   Procedure: LAPAROSCOPIC ASSISTED VAGINAL HYSTERECTOMY with bilateral salpingectomy ;  Surgeon: Lyman Speller, MD;  Location: [facility];  Service: Gynecology;  Laterality: Bilateral;  . TONSILLECTOMY AND ADENOIDECTOMY  32 year ago  . WISDOM  TOOTH EXTRACTION      Current Outpatient Prescriptions  Medication Sig Dispense Refill  . lisinopril (PRINIVIL) 10 MG tablet Take 10 mg by mouth daily.    . metFORMIN (GLUCOPHAGE) 850 MG tablet Take 1 tablet (850 mg total) by mouth 2 (two) times daily with a meal. Start after completing first presciption 180 tablet 3   No current facility-administered medications for this visit.     Family History  Problem Relation Age of Onset  . Colon cancer Mother 77    stage 1  . Diabetes Mother   . Lymphoma Paternal Grandmother   . Breast cancer Cousin     ROS:  Pertinent items are noted in HPI.  Otherwise, a comprehensive ROS was negative.  Exam:   LMP 09/05/2013   Weight change: @WEIGHTCHANGE @ Height:      Ht Readings from Last 3 Encounters:  06/04/16 5\' 3"  (1.6 m)  04/30/15 5' 3.5" (1.613 m)  03/27/14 5\' 3"  (1.6 m)    General appearance: alert, cooperative and appears stated age Head: Normocephalic, without obvious abnormality, atraumatic Neck: no adenopathy, supple, symmetrical, trachea midline and thyroid {EXAM; THYROID:18604} Lungs: clear to auscultation bilaterally Breasts: {Exam; breast:13139::"normal appearance, no masses or tenderness"} Heart: regular rate and rhythm Abdomen: soft, non-tender; bowel sounds normal; no masses,  no organomegaly Extremities: extremities normal, atraumatic, no cyanosis or edema  Skin: Skin color, texture, turgor normal. No rashes or lesions Lymph nodes: Cervical, supraclavicular, and axillary nodes normal. No abnormal inguinal nodes palpated Neurologic: Grossly normal   Pelvic: External genitalia:  no lesions              Urethra:  normal appearing urethra with no masses, tenderness or lesions              Bartholins and Skenes: normal                 Vagina: normal appearing vagina with normal color and discharge, no lesions              Cervix: {exam; cervix:14595}              Pap taken: {yes no:314532} Bimanual Exam:  Uterus:  {exam;  uterus:12215}              Adnexa: {exam; adnexa:12223}               Rectovaginal: Confirms               Anus:  normal sphincter tone, no lesions  Chaperone was present for exam.  A:  Well Woman with normal exam  P:   {plan; gyn:5269::"mammogram","pap smear","return annually or prn"}

## 2016-08-15 ENCOUNTER — Ambulatory Visit: Payer: 59 | Admitting: Obstetrics & Gynecology

## 2016-10-02 ENCOUNTER — Telehealth: Payer: Self-pay | Admitting: Obstetrics & Gynecology

## 2016-10-02 ENCOUNTER — Encounter: Payer: Self-pay | Admitting: Obstetrics & Gynecology

## 2016-10-02 ENCOUNTER — Other Ambulatory Visit (HOSPITAL_COMMUNITY)
Admission: RE | Admit: 2016-10-02 | Discharge: 2016-10-02 | Disposition: A | Discharge status: Home / Self Care | Payer: 59 | Source: Ambulatory Visit | Attending: Obstetrics & Gynecology | Admitting: Obstetrics & Gynecology

## 2016-10-02 ENCOUNTER — Ambulatory Visit (INDEPENDENT_AMBULATORY_CARE_PROVIDER_SITE_OTHER): Payer: 59 | Admitting: Obstetrics & Gynecology

## 2016-10-02 VITALS — BP 136/100 | HR 96 | Resp 18 | Ht 63.5 in | Wt 228.0 lb

## 2016-10-02 DIAGNOSIS — Z01419 Encounter for gynecological examination (general) (routine) without abnormal findings: Secondary | ICD-10-CM

## 2016-10-02 DIAGNOSIS — Z124 Encounter for screening for malignant neoplasm of cervix: Secondary | ICD-10-CM | POA: Diagnosis not present

## 2016-10-02 DIAGNOSIS — R5383 Other fatigue: Secondary | ICD-10-CM

## 2016-10-02 DIAGNOSIS — R932 Abnormal findings on diagnostic imaging of liver and biliary tract: Secondary | ICD-10-CM | POA: Diagnosis not present

## 2016-10-02 DIAGNOSIS — E119 Type 2 diabetes mellitus without complications: Secondary | ICD-10-CM | POA: Diagnosis not present

## 2016-10-02 DIAGNOSIS — Z0000 Encounter for general adult medical examination without abnormal findings: Secondary | ICD-10-CM

## 2016-10-02 MED ORDER — LISINOPRIL 10 MG PO TABS: 10.0000 mg | ORAL_TABLET | Freq: Every day | ORAL | 2 refills | Status: DC

## 2016-10-02 NOTE — Progress Notes (Signed)
46 y.o. [id number] MarriedCaucasianF here for annual exam.  Husband is now working in [place] and they are waiting for their oldest son to graduate from high school.  This is causing lots of stressors for her.  She thought she was having a heart attack in February.  Went to ER.  She was admitted for three full days.  HIDA scan was abnormal and repeated was recommended.  WBC was 29.5K.  She was treated with IV antibiotics. Gi follow up recommended.    Denies vaginal bleeding.    PCP:  Dr. Dema Severin.  Hasn't seen her in a few years.  Patient's last menstrual period was 09/05/2013.          Sexually active: Yes.    The current method of family planning is status post hysterectomy.    Exercising: Yes.    walking  Smoker:  no  Health Maintenance: Pap:  02/03/13 Neg. HR HPV:neg   06/21/11 Neg. HR HPV:neg  History of abnormal Pap:  no MMG:  06/17/13 BIRADS1:Neg  Colonoscopy: 04/01/13 Polyps- f/u 5 years  BMD:   Never TDaP:  01/2015  Pneumonia vaccine(s):  N/A Zostavax:   N/A Hep C testing: N/A Screening Labs: here    reports that she has never smoked. She has never used smokeless tobacco. She reports that she does not drink alcohol or use drugs.  Past Medical History:  Diagnosis Date  . Anemia due to chronic blood loss 01/12/2014  . Colon polyps 2014   history  hyperplastic  . GERD (gastroesophageal reflux disease)    occasional - no meds - diet controlled  . Headache(784.0)    otc med prn  . Hypertension    borderline - no meds  . Leukocytosis 05/2016  . PCOS (polycystic ovarian syndrome)    tx with metformin  . SVD (spontaneous vaginal delivery)    x 3    Past Surgical History:  Procedure Laterality Date  . BREAST LUMPECTOMY Right 20 years ago   Benign Enlarged Lymph Node  . DILATION AND CURETTAGE OF UTERUS     x2, with SABs  . EXTERNAL CEPHALIC VERSION  7/68/1157   version for SVD w/ epidural  . FINGER SURGERY  25 years ago   right hand - pointer finger  . FOOT SURGERY  2013    old fracture bone piece removed - right foot  . HERNIA REPAIR    . LAPAROSCOPIC ASSISTED VAGINAL HYSTERECTOMY Bilateral 01/12/2014   Procedure: LAPAROSCOPIC ASSISTED VAGINAL HYSTERECTOMY with bilateral salpingectomy ;  Surgeon: Lyman Speller, MD;  Location: [facility];  Service: Gynecology;  Laterality: Bilateral;  . TONSILLECTOMY AND ADENOIDECTOMY  32 year ago  . WISDOM TOOTH EXTRACTION      Current Outpatient Prescriptions  Medication Sig Dispense Refill  . lisinopril (PRINIVIL) 10 MG tablet Take 10 mg by mouth daily.    . metFORMIN (GLUCOPHAGE) 850 MG tablet Take 1 tablet (850 mg total) by mouth 2 (two) times daily with a meal. Start after completing first presciption (Patient not taking: Reported on 10/02/2016) 180 tablet 3   No current facility-administered medications for this visit.     Family History  Problem Relation Age of Onset  . Colon cancer Mother 80       stage 1  . Diabetes Mother   . Lymphoma Paternal Grandmother   . Breast cancer Cousin     ROS:  Pertinent items are noted in HPI.  Otherwise, a comprehensive ROS was negative.  Exam:   BP [name])  136/100 (BP Location: Left Arm, Patient Position: Sitting, Cuff Size: Large)   Pulse 96   Resp 18   Ht 5' 3.5" (1.613 m)   Wt 228 lb (103.4 kg)   LMP 09/05/2013   BMI 39.75 kg/m   Weight change: +3#   Height: 5' 3.5" (161.3 cm)  Ht Readings from Last 3 Encounters:  10/02/16 5' 3.5" (1.613 m)  06/04/16 5\' 3"  (1.6 m)  04/30/15 5' 3.5" (1.613 m)    General appearance: alert, cooperative and appears stated age Head: Normocephalic, without obvious abnormality, atraumatic Neck: no adenopathy, supple, symmetrical, trachea midline and thyroid normal to inspection and palpation Lungs: clear to auscultation bilaterally Breasts: normal appearance, no masses or tenderness Heart: regular rate and rhythm Abdomen: soft, non-tender; bowel sounds normal; no masses,  no organomegaly Extremities: extremities normal, atraumatic,  no cyanosis or edema Skin: Skin color, texture, turgor normal. No rashes or lesions Lymph nodes: Cervical, supraclavicular, and axillary nodes normal. No abnormal inguinal nodes palpated Neurologic: Grossly normal   Pelvic: External genitalia:  no lesions              Urethra:  normal appearing urethra with no masses, tenderness or lesions              Bartholins and Skenes: normal                 Vagina: normal appearing vagina with normal color and discharge, no lesions              Cervix: absent              Pap taken: Yes.   Bimanual Exam:  Uterus:  uterus absent              Adnexa: no mass, fullness, tenderness               Rectovaginal: Confirms               Anus:  normal sphincter tone, no lesions  Chaperone was present for exam.  A:  Well Woman with normal exam Hospitalization in 2/18 with elevated WBC ct of unclear origin, evaluation with abnormal HIDA scan Type 2 diabetes Hypertension H/O LAVH/bilateral salpingectomy 9/15 due to menorrhagia H/O PICA that resolved after surgery Fatigue  P:   Mammogram guidelines reviewed Referral to Dr. Benson Norway for repeat HIDA scan CBC, CMP, ferritin level today Referral to Dr. Delma Freeze to establish care Lisinopril 10mg  daily.  #30/2RF Pt will call with metformin dosage for RF until establishes care pap smear obtained today return annually or prn

## 2016-10-02 NOTE — Telephone Encounter (Signed)
Left voicemail regarding referral appointment. The information is listed below. Should the patient need to cancel or reschedule this appointment, please advise them to call the office they've been referred to in order to reschedule.  [facility]  Phone: [phone]  Dr Collene Mares 10-03-16 @ 10:45am. Please arrive 15 minutes early and bring your insurance card, photo id and list of medications.

## 2016-10-02 NOTE — Telephone Encounter (Signed)
Spoke with patient. She understands and is agreeable to appointment tomorrow with Dr. Collene Mares.  Patient noted Dr. [name] Heck asked about her previous Metformin prescription dosage. Patient states it was 850 mg.  Routing to provider for review.

## 2016-10-03 ENCOUNTER — Other Ambulatory Visit: Payer: Self-pay | Admitting: Obstetrics & Gynecology

## 2016-10-03 DIAGNOSIS — E8881 Metabolic syndrome: Secondary | ICD-10-CM

## 2016-10-03 DIAGNOSIS — R7303 Prediabetes: Secondary | ICD-10-CM

## 2016-10-03 LAB — CBC WITH DIFFERENTIAL/PLATELET
Basophils Absolute: 0.1 10*3/uL (ref 0.0–0.2)
Basos: 1 %
EOS (ABSOLUTE): 0.3 10*3/uL (ref 0.0–0.4)
Eos: 4 %
Hematocrit: 39.8 % (ref 34.0–46.6)
Hemoglobin: 13.7 g/dL (ref 11.1–15.9)
Immature Grans (Abs): 0 10*3/uL (ref 0.0–0.1)
Immature Granulocytes: 0 %
Lymphocytes Absolute: 1.6 10*3/uL (ref 0.7–3.1)
Lymphs: 18 %
MCH: 30.4 pg (ref 26.6–33.0)
MCHC: 34.4 g/dL (ref 31.5–35.7)
MCV: 88 fL (ref 79–97)
MONOS ABS: 0.6 10*3/uL (ref 0.1–0.9)
Monocytes: 7 %
NEUTROS PCT: 70 %
Neutrophils Absolute: 6.2 10*3/uL (ref 1.4–7.0)
PLATELETS: 333 10*3/uL (ref 150–379)
RBC: 4.5 x10E6/uL (ref 3.77–5.28)
RDW: 13.4 % (ref 12.3–15.4)
WBC: 8.8 10*3/uL (ref 3.4–10.8)

## 2016-10-03 LAB — CYTOLOGY - PAP: Diagnosis: NEGATIVE

## 2016-10-03 LAB — COMPREHENSIVE METABOLIC PANEL
A/G RATIO: 1.4 (ref 1.2–2.2)
ALK PHOS: 54 IU/L (ref 39–117)
ALT: 22 IU/L (ref 0–32)
AST: 17 IU/L (ref 0–40)
Albumin: 4.2 g/dL (ref 3.5–5.5)
BILIRUBIN TOTAL: 0.2 mg/dL (ref 0.0–1.2)
BUN/Creatinine Ratio: 14 (ref 9–23)
BUN: 9 mg/dL (ref 6–24)
CO2: 20 mmol/L (ref 20–29)
Calcium: 9.3 mg/dL (ref 8.7–10.2)
Chloride: 104 mmol/L (ref 96–106)
Creatinine, Ser: 0.66 mg/dL (ref 0.57–1.00)
GFR calc Af Amer: 123 mL/min/{1.73_m2} (ref >59)
GFR calc non Af Amer: 107 mL/min/{1.73_m2} (ref >59)
GLOBULIN, TOTAL: 2.9 g/dL (ref 1.5–4.5)
Glucose: 146 mg/dL — ABNORMAL HIGH (ref 65–99)
POTASSIUM: 4.3 mmol/L (ref 3.5–5.2)
SODIUM: 143 mmol/L (ref 134–144)
Total Protein: 7.1 g/dL (ref 6.0–8.5)

## 2016-10-03 LAB — FERRITIN: Ferritin: 126 ng/mL (ref 15–150)

## 2016-10-03 NOTE — Telephone Encounter (Signed)
Medication refill request: metformin 850mg   Last AEX:  10/02/16 SM Next AEX: 12/14/17 Last MMG (if hormonal medication request): 2015  Refill authorized: 06/10/14 #[id number] /3 R. Today please advise.

## 2016-10-09 ENCOUNTER — Telehealth: Payer: Self-pay | Admitting: Obstetrics & Gynecology

## 2016-10-09 NOTE — Telephone Encounter (Signed)
Opened in error

## 2016-11-03 ENCOUNTER — Other Ambulatory Visit: Payer: Self-pay | Admitting: Surgery

## 2016-11-07 NOTE — Patient Instructions (Signed)
Sherry Case  11/07/2016   Your procedure is scheduled on: 11/17/2016    Report to [facility] Main  Entrance Take [place]  elevators to 3rd floor to  [place] at     [place] AM.   Call this number if you have problems the morning of surgery [phone]    Remember: ONLY 1 PERSON MAY GO WITH YOU TO SHORT STAY TO GET  READY MORNING OF [place].  Do not eat food or drink liquids :After Midnight.     Take these medicines the morning of surgery with A SIP OF WATER: none                                 You may not have any metal on your body including hair pins and              piercings  Do not wear jewelry, make-up, lotions, powders or perfumes, deodorant             Do not wear nail polish.  Do not shave  48 hours prior to surgery.               Do not bring valuables to the hospital. [place].  Contacts, dentures or bridgework may not be worn into surgery.      Patients discharged the day of surgery will not be allowed to drive home.  Name and phone number of your driver:                Please read over the following fact sheets you were given: _____________________________________________________________________             [facility] - Preparing for Surgery Before surgery, you can play an important role.  Because skin is not sterile, your skin needs to be as free of germs as possible.  You can reduce the number of germs on your skin by washing with CHG (chlorahexidine gluconate) soap before surgery.  CHG is an antiseptic cleaner which kills germs and bonds with the skin to continue killing germs even after washing. Please DO NOT use if you have an allergy to CHG or antibacterial soaps.  If your skin becomes reddened/irritated stop using the CHG and inform your nurse when you arrive at Short Stay. Do not shave (including legs and underarms) for at least 48 hours prior to the first CHG shower.   You may shave your face/neck. Please follow these instructions carefully:  1.  Shower with CHG Soap the night before surgery and the  morning of Surgery.  2.  If you choose to wash your hair, wash your hair first as usual with your  normal  shampoo.  3.  After you shampoo, rinse your hair and body thoroughly to remove the  shampoo.                           4.  Use CHG as you would any other liquid soap.  You can apply chg directly  to the skin and wash                       Gently with a scrungie  or clean washcloth.  5.  Apply the CHG Soap to your body ONLY FROM THE NECK DOWN.   Do not use on face/ open                           Wound or open sores. Avoid contact with eyes, ears mouth and genitals (private parts).                       Wash face,  Genitals (private parts) with your normal soap.             6.  Wash thoroughly, paying special attention to the area where your surgery  will be performed.  7.  Thoroughly rinse your body with warm water from the neck down.  8.  DO NOT shower/wash with your normal soap after using and rinsing off  the CHG Soap.                9.  Pat yourself dry with a clean towel.            10.  Wear clean pajamas.            11.  Place clean sheets on your bed the night of your first shower and do not  sleep with pets. Day of Surgery : Do not apply any lotions/deodorants the morning of surgery.  Please wear clean clothes to the hospital/surgery center.  FAILURE TO FOLLOW THESE INSTRUCTIONS MAY RESULT IN THE CANCELLATION OF YOUR SURGERY PATIENT SIGNATURE_________________________________  NURSE SIGNATURE__________________________________  ________________________________________________________________________

## 2016-11-10 ENCOUNTER — Encounter (HOSPITAL_COMMUNITY): Payer: Self-pay

## 2016-11-10 ENCOUNTER — Encounter (HOSPITAL_COMMUNITY)
Admission: RE | Admit: 2016-11-10 | Discharge: 2016-11-10 | Disposition: A | Discharge status: Home / Self Care | Payer: 59 | Source: Ambulatory Visit | Attending: Surgery | Admitting: Surgery

## 2016-11-10 DIAGNOSIS — Z01812 Encounter for preprocedural laboratory examination: Secondary | ICD-10-CM | POA: Insufficient documentation

## 2016-11-10 HISTORY — DX: Other specified postprocedural states: R11.2

## 2016-11-10 HISTORY — DX: Other specified postprocedural states: Z98.890

## 2016-11-10 LAB — BASIC METABOLIC PANEL
ANION GAP: 12 (ref 5–15)
BUN: 11 mg/dL (ref 6–20)
CALCIUM: 9.7 mg/dL (ref 8.9–10.3)
CO2: 23 mmol/L (ref 22–32)
CREATININE: 0.68 mg/dL (ref 0.44–1.00)
Chloride: 102 mmol/L (ref 101–111)
GFR calc Af Amer: >60 mL/min (ref >60)
GLUCOSE: 138 mg/dL — AB (ref 65–99)
Potassium: 4 mmol/L (ref 3.5–5.1)
Sodium: 137 mmol/L (ref 135–145)

## 2016-11-10 LAB — CBC
HCT: 42 % (ref 36.0–46.0)
Hemoglobin: 14 g/dL (ref 12.0–15.0)
MCH: 30.7 pg (ref 26.0–34.0)
MCHC: 33.3 g/dL (ref 30.0–36.0)
MCV: 92.1 fL (ref 78.0–100.0)
PLATELETS: 326 10*3/uL (ref 150–400)
RBC: 4.56 MIL/uL (ref 3.87–5.11)
RDW: 13.2 % (ref 11.5–15.5)
WBC: 11 10*3/uL — AB (ref 4.0–10.5)

## 2016-11-10 NOTE — Progress Notes (Signed)
EKG-06/05/16-epic  CXR-06/04/16-epic  

## 2016-11-11 LAB — HEMOGLOBIN A1C
Hgb A1c MFr Bld: 6.7 % — ABNORMAL HIGH (ref 4.8–5.6)
Mean Plasma Glucose: 146 mg/dL

## 2016-11-16 NOTE — H&P (Signed)
Sherry Case 11/03/2016 10:16 AM Location: [facility] Patient #: [id number] DOB: Dec 23, 1970 Married / Language: Cleophus Molt / Race: White Female   History of Present Illness (Kasten Leveque A. Ninfa Linden MD; 11/03/2016 10:27 AM) The patient is a 46 year old female who presents with abdominal pain. This is a pleasant 46 year old female who presents with a several year history of epigastric and right upper quadrant abdominal pain with nausea and vomiting after fatty meals. She even had an episode of chest pain with this back in February. She had a normal CT of her abdomen and pelvis. She is a normal ultrasound and normal upper endoscopy. She had a HIDA scan showing an 18% gallbladder ejection fraction. Her mother and her brother have had to have their gallbladders removed for dyskinesia and chronic cholecystitis as well. She is otherwise without complaints. The pain can be moderate to severe and is described as sharp and hurting through to the back. Again, it occurs after fatty meals. She is otherwise healthy without complaints.   Past Surgical History Sharyn Lull R. Brooks, CMA; 11/03/2016 10:16 AM) Breast Biopsy  Right. Colon Polyp Removal - Colonoscopy  Foot Surgery  Right. Hysterectomy (not due to cancer) - Complete  Tonsillectomy   Diagnostic Studies History Sharyn Lull R. Brooks, CMA; 11/03/2016 10:16 AM) Colonoscopy  1-5 years ago Mammogram  >3 years ago Pap Smear  1-5 years ago  Allergies Sharyn Lull R. Brooks, CMA; 11/03/2016 10:16 AM) No Known Drug Allergies 11/03/2016  Medication History Sharyn Lull R. Brooks, CMA; 11/03/2016 10:17 AM) Lisinopril (10MG  Tablet, Oral) Active. MetFORMIN HCl (850MG  Tablet, Oral) Active. Medications Reconciled  Social History Sharyn Lull R. Brooks, CMA; 11/03/2016 10:16 AM) Alcohol use  Occasional alcohol use. Caffeine use  Carbonated beverages. No drug use  Tobacco use  Never smoker.  Family History Sharyn Lull R. Brooks, CMA;  11/03/2016 10:16 AM) Alcohol Abuse  Family Members In General. Cerebrovascular Accident  Family Members In [place]  Mother. Colon Polyps  Mother. Diabetes Mellitus  Mother. Heart Disease  Family Members In General. Heart disease in female family member before age 81  Hypertension  Mother.  Pregnancy / Birth History Sharyn Lull R. Rolena Infante, CMA; 11/03/2016 10:16 AM) Age at menarche  43 years. Gravida  5 Irregular periods  Length (months) of breastfeeding  7-12 Maternal age  72-30 Para  3  Other Problems Sharyn Lull R. Brooks, CMA; 11/03/2016 10:16 AM) Back Pain  High blood pressure  Migraine Headache  Umbilical Hernia Repair     Review of Systems [facility] R. Brooks CMA; 11/03/2016 10:16 AM) General Present- Fatigue and Weight Gain. Not Present- Appetite Loss, Chills, Fever, Night Sweats and Weight Loss. Skin Not Present- Change in Wart/Mole, Dryness, Hives, Jaundice, New Lesions, Non-Healing Wounds, Rash and Ulcer. HEENT Present- Wears glasses/contact lenses. Not Present- Earache, Hearing Loss, Hoarseness, Nose Bleed, Oral Ulcers, Ringing in the Ears, Seasonal Allergies, Sinus Pain, Sore Throat, Visual Disturbances and Yellow Eyes. Respiratory Present- Snoring. Not Present- Bloody sputum, Chronic Cough, Difficulty Breathing and Wheezing. Breast Not Present- Breast Mass, Breast Pain, Nipple Discharge and Skin Changes. Cardiovascular Present- Swelling of Extremities. Not Present- Chest Pain, Difficulty Breathing Lying Down, Leg Cramps, Palpitations, Rapid Heart Rate and Shortness of Breath. Gastrointestinal Present- Bloating. Not Present- Abdominal Pain, Bloody Stool, Change in Bowel Habits, Chronic diarrhea, Constipation, Difficulty Swallowing, Excessive gas, Gets full quickly at meals, Hemorrhoids, Indigestion, Nausea, Rectal Pain and Vomiting. Female Genitourinary Not Present- Frequency, Nocturia, Painful Urination, Pelvic Pain and Urgency. Musculoskeletal  Present- Joint Stiffness and Swelling of Extremities. Not  Present- Back Pain, Joint Pain, Muscle Pain and Muscle Weakness. Neurological Present- Numbness and Tingling. Not Present- Decreased Memory, Fainting, Headaches, Seizures, Tremor, Trouble walking and Weakness. Psychiatric Not Present- Anxiety, Bipolar, Change in Sleep Pattern, Depression, Fearful and Frequent crying. Endocrine Not Present- Cold Intolerance, Excessive Hunger, Hair Changes, Heat Intolerance, Hot flashes and New Diabetes. Hematology Not Present- Blood Thinners, Easy Bruising, Excessive bleeding, Gland problems, HIV and Persistent Infections.  Vitals [employer] R. Brooks CMA; 11/03/2016 10:16 AM) 11/03/2016 10:16 AM Weight: 228 lb Height: 63in Body Surface Area: 2.04 m Body Mass Index: 40.39 kg/m  Pulse: 122 (Regular)  BP: 152/92 (Sitting, Left Arm, Standard)       Physical Exam (Alver Leete A. Ninfa Linden MD; 11/03/2016 10:27 AM) General Mental Status-Alert. General Appearance-Consistent with stated age. Hydration-Well hydrated. Voice-Normal.  Head and Neck Head-normocephalic, atraumatic with no lesions or palpable masses.  Eye Eyeball - Bilateral-Extraocular movements intact. Sclera/Conjunctiva - Bilateral-No scleral icterus.  Chest and Lung Exam Chest and lung exam reveals -quiet, even and easy respiratory effort with no use of accessory muscles and on auscultation, normal breath sounds, no adventitious sounds and normal vocal resonance. Inspection Chest Wall - Normal. Back - normal.  Cardiovascular Cardiovascular examination reveals -on palpation PMI is normal in location and amplitude, no palpable S3 or S4. Normal cardiac borders., normal heart sounds, regular rate and rhythm with no murmurs, carotid auscultation reveals no bruits and normal pedal pulses bilaterally.  Abdomen Inspection Inspection of the abdomen reveals - No Hernias. Skin - Scar - no surgical  scars. Palpation/Percussion Palpation and Percussion of the abdomen reveal - Soft, Non Tender, No Rebound tenderness, No Rigidity (guarding) and No hepatosplenomegaly. Auscultation Auscultation of the abdomen reveals - Bowel sounds normal.  Neurologic - Did not examine.  Musculoskeletal - Did not examine.    Assessment & Plan (Antha Niday A. 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 I discussed with the patient regarding the disease process. We discussed expected management first cholecystectomy. I gave her literature regarding surgery. We discussed laparoscopic cholecystectomy in detail. We discussed the risks which includes but is not limited to bleeding, infection, bile duct injury, bile leak, injury to other structures, the need to convert to an open procedure, cardiopulmonary issues, DVT, the chances may not resolve all her symptoms, postoperative recovery, etc. She understands and wishes to proceed with surgery which will be scheduled

## 2016-11-17 ENCOUNTER — Ambulatory Visit (HOSPITAL_COMMUNITY)
Admission: RE | Admit: 2016-11-17 | Discharge: 2016-11-17 | Disposition: A | Discharge status: Home / Self Care | Payer: 59 | Source: Ambulatory Visit | Attending: Surgery | Admitting: Surgery

## 2016-11-17 ENCOUNTER — Encounter (HOSPITAL_COMMUNITY): Payer: Self-pay | Admitting: *Deleted

## 2016-11-17 ENCOUNTER — Ambulatory Visit (HOSPITAL_COMMUNITY): Payer: 59 | Admitting: Anesthesiology

## 2016-11-17 ENCOUNTER — Encounter (HOSPITAL_COMMUNITY)
Admission: RE | Disposition: A | Discharge status: Home / Self Care | Payer: Self-pay | Source: Ambulatory Visit | Attending: Surgery

## 2016-11-17 DIAGNOSIS — I10 Essential (primary) hypertension: Secondary | ICD-10-CM | POA: Diagnosis not present

## 2016-11-17 DIAGNOSIS — K828 Other specified diseases of gallbladder: Secondary | ICD-10-CM | POA: Insufficient documentation

## 2016-11-17 DIAGNOSIS — K811 Chronic cholecystitis: Secondary | ICD-10-CM | POA: Diagnosis present

## 2016-11-17 DIAGNOSIS — Z79899 Other long term (current) drug therapy: Secondary | ICD-10-CM | POA: Insufficient documentation

## 2016-11-17 HISTORY — PX: CHOLECYSTECTOMY: SHX55

## 2016-11-17 LAB — GLUCOSE, CAPILLARY
GLUCOSE-CAPILLARY: 198 mg/dL — AB (ref 65–99)
Glucose-Capillary: 173 mg/dL — ABNORMAL HIGH (ref 65–99)

## 2016-11-17 SURGERY — LAPAROSCOPIC CHOLECYSTECTOMY
Anesthesia: General | Site: Abdomen

## 2016-11-17 MED ORDER — LIDOCAINE 2% (20 MG/ML) 5 ML SYRINGE
INTRAMUSCULAR | Status: AC
  Filled 2016-11-17: qty 5

## 2016-11-17 MED ORDER — ROCURONIUM BROMIDE 50 MG/5ML IV SOSY
PREFILLED_SYRINGE | INTRAVENOUS | Status: AC
  Filled 2016-11-17: qty 5

## 2016-11-17 MED ORDER — PROPOFOL 10 MG/ML IV BOLUS
INTRAVENOUS | Status: DC | PRN
  Administered 2016-11-17: 200 mg via INTRAVENOUS

## 2016-11-17 MED ORDER — ONDANSETRON HCL 4 MG/2ML IJ SOLN
INTRAMUSCULAR | Status: DC | PRN
  Administered 2016-11-17: 4 mg via INTRAVENOUS

## 2016-11-17 MED ORDER — MORPHINE SULFATE (PF) 4 MG/ML IV SOLN: 1.0000 mg | INTRAVENOUS | Status: DC | PRN

## 2016-11-17 MED ORDER — EPHEDRINE SULFATE-NACL 50-0.9 MG/10ML-% IV SOSY
PREFILLED_SYRINGE | INTRAVENOUS | Status: DC | PRN
  Administered 2016-11-17: 10 mg via INTRAVENOUS

## 2016-11-17 MED ORDER — DEXAMETHASONE SODIUM PHOSPHATE 10 MG/ML IJ SOLN
INTRAMUSCULAR | Status: AC
  Filled 2016-11-17: qty 1

## 2016-11-17 MED ORDER — PROPOFOL 10 MG/ML IV BOLUS
INTRAVENOUS | Status: AC
  Filled 2016-11-17: qty 40

## 2016-11-17 MED ORDER — 0.9 % SODIUM CHLORIDE (POUR BTL) OPTIME
TOPICAL | Status: DC | PRN
  Administered 2016-11-17: 1000 mL

## 2016-11-17 MED ORDER — SCOPOLAMINE 1 MG/3DAYS TD PT72
MEDICATED_PATCH | TRANSDERMAL | Status: AC
  Filled 2016-11-17: qty 1

## 2016-11-17 MED ORDER — SODIUM CHLORIDE 0.9 % IV SOLN
250.0000 mL | INTRAVENOUS | Status: DC | PRN
Start: 2016-11-17 — End: 2016-11-17

## 2016-11-17 MED ORDER — LIDOCAINE 2% (20 MG/ML) 5 ML SYRINGE
INTRAMUSCULAR | Status: DC | PRN
  Administered 2016-11-17: 60 mg via INTRAVENOUS

## 2016-11-17 MED ORDER — BUPIVACAINE HCL (PF) 0.5 % IJ SOLN
INTRAMUSCULAR | Status: AC
  Filled 2016-11-17: qty 30

## 2016-11-17 MED ORDER — FENTANYL CITRATE (PF) 100 MCG/2ML IJ SOLN
INTRAMUSCULAR | Status: DC | PRN
  Administered 2016-11-17: 50 ug via INTRAVENOUS
  Administered 2016-11-17: 200 ug via INTRAVENOUS

## 2016-11-17 MED ORDER — SUCCINYLCHOLINE CHLORIDE 200 MG/10ML IV SOSY
PREFILLED_SYRINGE | INTRAVENOUS | Status: AC
  Filled 2016-11-17: qty 10

## 2016-11-17 MED ORDER — HYDROMORPHONE HCL-NACL 0.5-0.9 MG/ML-% IV SOSY
PREFILLED_SYRINGE | INTRAVENOUS | Status: AC
  Filled 2016-11-17: qty 1

## 2016-11-17 MED ORDER — BUPIVACAINE HCL (PF) 0.5 % IJ SOLN
INTRAMUSCULAR | Status: DC | PRN
  Administered 2016-11-17: 20 mL

## 2016-11-17 MED ORDER — LACTATED RINGERS IR SOLN
Status: DC | PRN
  Administered 2016-11-17: 1000 mL

## 2016-11-17 MED ORDER — ONDANSETRON HCL 4 MG/2ML IJ SOLN
INTRAMUSCULAR | Status: AC
  Filled 2016-11-17: qty 2

## 2016-11-17 MED ORDER — FAMOTIDINE IN NACL 20-0.9 MG/50ML-% IV SOLN
20.0000 mg | Freq: Once | INTRAVENOUS | Status: AC
  Administered 2016-11-17: 20 mg via INTRAVENOUS
  Filled 2016-11-17: qty 50

## 2016-11-17 MED ORDER — SODIUM CHLORIDE 0.9% FLUSH: 3.0000 mL | Freq: Two times a day (BID) | INTRAVENOUS | Status: DC

## 2016-11-17 MED ORDER — DEXAMETHASONE SODIUM PHOSPHATE 10 MG/ML IJ SOLN
INTRAMUSCULAR | Status: DC | PRN
  Administered 2016-11-17: 10 mg via INTRAVENOUS

## 2016-11-17 MED ORDER — HYDROMORPHONE HCL-NACL 0.5-0.9 MG/ML-% IV SOSY
0.2500 mg | PREFILLED_SYRINGE | INTRAVENOUS | Status: DC | PRN
  Administered 2016-11-17: 0.5 mg via INTRAVENOUS

## 2016-11-17 MED ORDER — CEFAZOLIN SODIUM-DEXTROSE 2-4 GM/100ML-% IV SOLN
INTRAVENOUS | Status: AC
  Filled 2016-11-17: qty 100

## 2016-11-17 MED ORDER — CEFAZOLIN SODIUM-DEXTROSE 2-4 GM/100ML-% IV SOLN
2.0000 g | INTRAVENOUS | Status: AC
Start: 2016-11-17 — End: 2016-11-17
  Administered 2016-11-17: 2 g via INTRAVENOUS

## 2016-11-17 MED ORDER — MIDAZOLAM HCL 5 MG/5ML IJ SOLN
INTRAMUSCULAR | Status: DC | PRN
  Administered 2016-11-17: 2 mg via INTRAVENOUS

## 2016-11-17 MED ORDER — ROCURONIUM BROMIDE 10 MG/ML (PF) SYRINGE
PREFILLED_SYRINGE | INTRAVENOUS | Status: DC | PRN
  Administered 2016-11-17: 50 mg via INTRAVENOUS

## 2016-11-17 MED ORDER — SCOPOLAMINE 1 MG/3DAYS TD PT72
MEDICATED_PATCH | TRANSDERMAL | Status: DC | PRN
  Administered 2016-11-17: 1 via TRANSDERMAL

## 2016-11-17 MED ORDER — SUGAMMADEX SODIUM 500 MG/5ML IV SOLN
INTRAVENOUS | Status: AC
  Filled 2016-11-17: qty 5

## 2016-11-17 MED ORDER — LACTATED RINGERS IV SOLN
INTRAVENOUS | Status: DC
  Administered 2016-11-17: 11:00:00 via INTRAVENOUS

## 2016-11-17 MED ORDER — SUCCINYLCHOLINE CHLORIDE 200 MG/10ML IV SOSY
PREFILLED_SYRINGE | INTRAVENOUS | Status: DC | PRN
  Administered 2016-11-17: 120 mg via INTRAVENOUS

## 2016-11-17 MED ORDER — GLYCOPYRROLATE 0.2 MG/ML IV SOSY
PREFILLED_SYRINGE | INTRAVENOUS | Status: DC | PRN
  Administered 2016-11-17: .2 mg via INTRAVENOUS

## 2016-11-17 MED ORDER — LACTATED RINGERS IV SOLN
INTRAVENOUS | Status: DC | PRN
  Administered 2016-11-17: 06:00:00 via INTRAVENOUS

## 2016-11-17 MED ORDER — OXYCODONE HCL 5 MG PO TABS
5.0000 mg | ORAL_TABLET | Freq: Once | ORAL | Status: DC | PRN
  Filled 2016-11-17: qty 1

## 2016-11-17 MED ORDER — OXYCODONE HCL 5 MG/5ML PO SOLN
5.0000 mg | Freq: Once | ORAL | Status: DC | PRN
  Filled 2016-11-17: qty 5

## 2016-11-17 MED ORDER — MIDAZOLAM HCL 2 MG/2ML IJ SOLN
INTRAMUSCULAR | Status: AC
  Filled 2016-11-17: qty 2

## 2016-11-17 MED ORDER — ACETAMINOPHEN 325 MG PO TABS: 650.0000 mg | ORAL_TABLET | ORAL | Status: DC | PRN

## 2016-11-17 MED ORDER — SODIUM CHLORIDE 0.9% FLUSH: 3.0000 mL | INTRAVENOUS | Status: DC | PRN

## 2016-11-17 MED ORDER — OXYCODONE HCL 5 MG PO TABS: 5.0000 mg | ORAL_TABLET | Freq: Four times a day (QID) | ORAL | 0 refills | Status: DC | PRN

## 2016-11-17 MED ORDER — ACETAMINOPHEN 650 MG RE SUPP
650.0000 mg | RECTAL | Status: DC | PRN
  Filled 2016-11-17: qty 1

## 2016-11-17 MED ORDER — SUGAMMADEX SODIUM 500 MG/5ML IV SOLN
INTRAVENOUS | Status: DC | PRN
  Administered 2016-11-17: 400 mg via INTRAVENOUS

## 2016-11-17 MED ORDER — OXYCODONE HCL 5 MG PO TABS: 5.0000 mg | ORAL_TABLET | ORAL | Status: DC | PRN

## 2016-11-17 MED ORDER — PROMETHAZINE HCL 25 MG/ML IJ SOLN: 6.2500 mg | INTRAMUSCULAR | Status: DC | PRN

## 2016-11-17 MED ORDER — FENTANYL CITRATE (PF) 250 MCG/5ML IJ SOLN
INTRAMUSCULAR | Status: AC
  Filled 2016-11-17: qty 5

## 2016-11-17 SURGICAL SUPPLY — 26 items
APPLIER CLIP 5 13 M/L LIGAMAX5 (MISCELLANEOUS) ×2
CABLE HIGH FREQUENCY MONO STRZ (ELECTRODE) ×2 IMPLANT
CHLORAPREP W/TINT 26ML (MISCELLANEOUS) ×2 IMPLANT
CLIP APPLIE 5 13 M/L LIGAMAX5 (MISCELLANEOUS) ×1 IMPLANT
COVER MAYO STAND STRL (DRAPES) IMPLANT
DECANTER SPIKE VIAL GLASS SM (MISCELLANEOUS) ×2 IMPLANT
DERMABOND ADVANCED (GAUZE/BANDAGES/DRESSINGS) ×1
DERMABOND ADVANCED .7 DNX12 (GAUZE/BANDAGES/DRESSINGS) ×1 IMPLANT
DRAPE C-ARM 42X120 X-RAY (DRAPES) IMPLANT
ELECT REM PT RETURN 15FT ADLT (MISCELLANEOUS) ×2 IMPLANT
GLOVE SURG SIGNA 7.5 PF LTX (GLOVE) ×2 IMPLANT
GOWN STRL REUS W/TWL XL LVL3 (GOWN DISPOSABLE) ×4 IMPLANT
HEMOSTAT SURGICEL 4X8 (HEMOSTASIS) IMPLANT
KIT BASIN OR (CUSTOM PROCEDURE TRAY) ×2 IMPLANT
POUCH SPECIMEN RETRIEVAL 10MM (ENDOMECHANICALS) ×2 IMPLANT
SCISSORS LAP 5X35 DISP (ENDOMECHANICALS) ×2 IMPLANT
SET CHOLANGIOGRAPH MIX (MISCELLANEOUS) IMPLANT
SET IRRIG TUBING LAPAROSCOPIC (IRRIGATION / IRRIGATOR) ×2 IMPLANT
SLEEVE XCEL OPT CAN 5 100 (ENDOMECHANICALS) ×4 IMPLANT
SUT MNCRL AB 4-0 PS2 18 (SUTURE) ×2 IMPLANT
TOWEL OR 17X26 10 PK STRL BLUE (TOWEL DISPOSABLE) ×2 IMPLANT
TOWEL OR NON WOVEN STRL DISP B (DISPOSABLE) ×2 IMPLANT
TRAY LAPAROSCOPIC (CUSTOM PROCEDURE TRAY) ×2 IMPLANT
TROCAR BLADELESS OPT 5 100 (ENDOMECHANICALS) ×2 IMPLANT
TROCAR XCEL BLUNT TIP 100MML (ENDOMECHANICALS) ×2 IMPLANT
TUBING INSUF HEATED (TUBING) ×2 IMPLANT

## 2016-11-17 NOTE — Discharge Instructions (Signed)
General Anesthesia, Adult, Care After These instructions provide you with information about caring for yourself after your procedure. Your health care provider may also give you more specific instructions. Your treatment has been planned according to current medical practices, but problems sometimes occur. Call your health care provider if you have any problems or questions after your procedure. What can I expect after the procedure? After the procedure, it is common to have:  Vomiting.  A sore throat.  Mental slowness.  It is common to feel:  Nauseous.  Cold or shivery.  Sleepy.  Tired.  Sore or achy, even in parts of your body where you did not have surgery.  Follow these instructions at home: For at least 24 hours after the procedure:  Do not: ? Participate in activities where you could fall or become injured. ? Drive. ? Use heavy machinery. ? Drink alcohol. ? Take sleeping pills or medicines that cause drowsiness. ? Make important decisions or sign legal documents. ? Take care of children on your own.  Rest. Eating and drinking  If you vomit, drink water, juice, or soup when you can drink without vomiting.  Drink enough fluid to keep your urine clear or pale yellow.  Make sure you have little or no nausea before eating solid foods.  Follow the diet recommended by your health care provider. General instructions  Have a responsible adult stay with you until you are awake and alert.  Return to your normal activities as told by your health care provider. Ask your health care provider what activities are safe for you.  Take over-the-counter and prescription medicines only as told by your health care provider.  If you smoke, do not smoke without supervision.  Keep all follow-up visits as told by your health care provider. This is important. Contact a health care provider if:  You continue to have nausea or vomiting at home, and medicines are not helpful.  You  cannot drink fluids or start eating again.  You cannot urinate after 8-12 hours.  You develop a skin rash.  You have fever.  You have increasing redness at the site of your procedure. Get help right away if:  You have difficulty breathing.  You have chest pain.  You have unexpected bleeding.  You feel that you are having a life-threatening or urgent problem. This information is not intended to replace advice given to you by your health care provider. Make sure you discuss any questions you have with your health care provider. Document Released: 07/17/2000 Document Revised: 09/13/2015 Document Reviewed: 03/25/2015 Elsevier Interactive Patient Education  2018 [place] ______[employer] LAPAROSCOPIC SURGERY: POST OP INSTRUCTIONS Always review your discharge instruction sheet given to you by the facility where your surgery was performed. IF YOU HAVE DISABILITY OR FAMILY LEAVE FORMS, YOU MUST BRING THEM TO THE OFFICE FOR PROCESSING.   DO NOT GIVE THEM TO YOUR DOCTOR.  1. A prescription for pain medication may be given to you upon discharge.  Take your pain medication as prescribed, if needed.  If narcotic pain medicine is not needed, then you may take acetaminophen (Tylenol) or ibuprofen (Advil) as needed. 2. Take your usually prescribed medications unless otherwise directed. 3. If you need a refill on your pain medication, please contact your pharmacy.  They will contact our office to request authorization. Prescriptions will not be filled after 5pm or on week-ends. 4. You should follow a light diet the first few days after arrival home, such as soup  and crackers, etc.  Be sure to include lots of fluids daily. 5. Most patients will experience some swelling and bruising in the area of the incisions.  Ice packs will help.  Swelling and bruising can take several days to resolve.  6. It is common to experience some constipation if taking pain medication after  surgery.  Increasing fluid intake and taking a stool softener (such as Colace) will usually help or prevent this problem from occurring.  A mild laxative (Milk of Magnesia or Miralax) should be taken according to package instructions if there are no bowel movements after 48 hours. 7. Unless discharge instructions indicate otherwise, you may remove your bandages 24-48 hours after surgery, and you may shower at that time.  You may have steri-strips (small skin tapes) in place directly over the incision.  These strips should be left on the skin for 7-10 days.  If your surgeon used skin glue on the incision, you may shower in 24 hours.  The glue will flake off over the next 2-3 weeks.  Any sutures or staples will be removed at the office during your follow-up visit. 8. ACTIVITIES:  You may resume regular (light) daily activities beginning the next day--such as daily self-care, walking, climbing stairs--gradually increasing activities as tolerated.  You may have sexual intercourse when it is comfortable.  Refrain from any heavy lifting or straining until approved by your doctor. a. You may drive when you are no longer taking prescription pain medication, you can comfortably wear a seatbelt, and you can safely maneuver your car and apply brakes. b. RETURN TO WORK:  __________________________________________________________ 9. You should see your doctor in the office for a follow-up appointment approximately 2-3 weeks after your surgery.  Make sure that you call for this appointment within a day or two after you arrive home to insure a convenient appointment time. 10. OTHER INSTRUCTIONS:OK TO SHOWER STARTING TOMORROW 11. ICE PACK, TYLENOL, IBUPROFEN ALSO FOR PAIN 12. NO LIFTING MORE THAN 15 POUNDS FOR 2 WEEKS __________________________________________________________________________________________________________________________  __________________________________________________________________________________________________________________________ WHEN TO CALL YOUR DOCTOR: 1. Fever over 101.0 2. Inability to urinate 3. Continued bleeding from incision. 4. Increased pain, redness, or drainage from the incision. 5. Increasing abdominal pain  The clinic staff is available to answer your questions during regular business hours.  Please dont hesitate to call and ask to speak to one of the nurses for clinical concerns.  If you have a medical emergency, go to the nearest emergency room or call 911.  A surgeon from [facility] is always on call at the hospital. [address] [place], [place], [place]  [address] ? [address], [place], [place]   [address] [phone] ? [phone] ? FAX [phone] Web site: [email or web address]

## 2016-11-17 NOTE — Transfer of Care (Signed)
Immediate Anesthesia Transfer of Care Note  Patient: Sherry Case  Procedure(s) Performed: Procedure(s): LAPAROSCOPIC CHOLECYSTECTOMY (N/A)  Patient Location: PACU  Anesthesia Type:General  Level of Consciousness:  sedated, patient cooperative and responds to stimulation  Airway & Oxygen Therapy:Patient Spontanous Breathing and Patient connected to face mask oxgen  Post-op Assessment:  Report given to PACU RN and Post -op Vital signs reviewed and stable  Post vital signs:  Reviewed and stable  Last Vitals:  Vitals:   11/17/16 0620  BP: (!) 157/89  Pulse: (!) 108  Resp: 18  Temp: 37 C    Complications: No apparent anesthesia complications

## 2016-11-17 NOTE — Anesthesia Postprocedure Evaluation (Signed)
Anesthesia Post Note  Patient: Sherry Case  Procedure(s) Performed: Procedure(s) (LRB): LAPAROSCOPIC CHOLECYSTECTOMY (N/A)     Patient location during evaluation: PACU Anesthesia Type: General Level of consciousness: awake and alert Pain management: pain level controlled Vital Signs Assessment: post-procedure vital signs reviewed and stable Respiratory status: spontaneous breathing, nonlabored ventilation, respiratory function stable and patient connected to nasal cannula oxygen Cardiovascular status: blood pressure returned to baseline and stable Postop Assessment: no signs of nausea or vomiting Anesthetic complications: no    Last Vitals:  Vitals:   11/17/16 1125 11/17/16 1200  BP: 123/73 125/68  Pulse: 90 85  Resp: 14 16  Temp: 37.2 C 36.8 C    Last Pain:  Vitals:   11/17/16 1236  TempSrc:   PainSc: 2                  Cristal Qadir P Carson Bogden

## 2016-11-17 NOTE — Anesthesia Procedure Notes (Signed)
Procedure Name: Intubation Date/Time: 11/17/2016 7:30 AM Performed by: Dyanara Cozza, Virgel Gess Pre-anesthesia Checklist: Patient identified, Emergency Drugs available, Suction available, Patient being monitored and Timeout performed Patient Re-evaluated:Patient Re-evaluated prior to induction Oxygen Delivery Method: Circle system utilized Preoxygenation: Pre-oxygenation with 100% oxygen Induction Type: IV induction Ventilation: Mask ventilation without difficulty Laryngoscope Size: Mac and 4 Grade View: Grade III Tube type: Oral Tube size: 7.5 mm Number of attempts: 2 Airway Equipment and Method: Stylet and Video-laryngoscopy Placement Confirmation: ETT inserted through vocal cords under direct vision,  positive ETCO2,  CO2 detector and breath sounds checked- equal and bilateral Secured at: 22 cm Tube secured with: Tape Dental Injury: Teeth and Oropharynx as per pre-operative assessment  Difficulty Due To: Difficulty was anticipated, Difficult Airway- due to large tongue and Difficult Airway- due to anterior larynx Future Recommendations: Recommend- induction with short-acting agent, and alternative techniques readily available Comments: G3 with MAC4.  G1 with Glidescope.  ATOI.

## 2016-11-17 NOTE — Anesthesia Preprocedure Evaluation (Addendum)
Anesthesia Evaluation  Patient identified by MRN, date of birth, ID band Patient awake    Reviewed: Allergy & Precautions, H&P , Patient's Chart, lab work & pertinent test results, reviewed documented beta blocker date and time   History of Anesthesia Complications (+) PONV and history of anesthetic complications  Airway Mallampati: III  TM Distance: >3 FB Neck ROM: full    Dental   Pulmonary neg pulmonary ROS,    breath sounds clear to auscultation       Cardiovascular Exercise Tolerance: Good hypertension, Pt. on medications  Rhythm:regular Rate:Normal  ECG: SR, rate 89   Neuro/Psych  Headaches,    GI/Hepatic Neg liver ROS, Controlled,  Endo/Other  Oral Hypoglycemic Agents  Renal/GU negative Renal ROS     Musculoskeletal   Abdominal (+) + obese,   Peds  Hematology   Anesthesia Other Findings PCOS  Reproductive/Obstetrics                            Anesthesia Physical  Anesthesia Plan  ASA: II  Anesthesia Plan: General   Post-op Pain Management:    Induction:   PONV Risk Score and Plan: 3 and Ondansetron, Dexamethasone, Propofol, Midazolam and Scopolamine patch - Pre-op  Airway Management Planned: Oral ETT  Additional Equipment:   Intra-op Plan:   Post-operative Plan: Extubation in OR  Informed Consent: I have reviewed the patients History and Physical, chart, labs and discussed the procedure including the risks, benefits and alternatives for the proposed anesthesia with the patient or authorized representative who has indicated his/her understanding and acceptance.   Dental Advisory Given  Plan Discussed with: CRNA and Surgeon  Anesthesia Plan Comments:        Anesthesia Quick Evaluation

## 2016-11-17 NOTE — Op Note (Addendum)

## 2016-11-17 NOTE — Interval H&P Note (Signed)
History and Physical Interval Note:no change in H and P  11/17/2016 6:57 AM  Sherry Case  has presented today for surgery, with the diagnosis of biliary dyskinesia  The various methods of treatment have been discussed with the patient and family. After consideration of risks, benefits and other options for treatment, the patient has consented to  Procedure(s): LAPAROSCOPIC CHOLECYSTECTOMY (N/A) as a surgical intervention .  The patient's history has been reviewed, patient examined, no change in status, stable for surgery.  I have reviewed the patient's chart and labs.  Questions were answered to the patient's satisfaction.     Rajendra Spiller A

## 2016-12-14 ENCOUNTER — Other Ambulatory Visit: Payer: Self-pay | Admitting: *Deleted

## 2016-12-14 MED ORDER — LISINOPRIL 10 MG PO TABS: 10.0000 mg | ORAL_TABLET | Freq: Every day | ORAL | 0 refills | Status: DC

## 2016-12-14 NOTE — Telephone Encounter (Signed)
Pt was referred to Dr. Birdie Riddle and has appt in October.  RF done through October.  Ok to close encounter.

## 2016-12-14 NOTE — Telephone Encounter (Signed)
Medication refill request: Lisinopril 90 day supply  Last AEX:  10/02/16  Next AEX: 12/14/17 SM Last MMG (if hormonal medication request): 2015 Refill authorized: 10/02/16 #[id number]. Today please advise.

## 2017-01-01 ENCOUNTER — Other Ambulatory Visit: Payer: Self-pay | Admitting: Obstetrics & Gynecology

## 2017-01-01 DIAGNOSIS — E8881 Metabolic syndrome: Secondary | ICD-10-CM

## 2017-01-01 DIAGNOSIS — R7303 Prediabetes: Secondary | ICD-10-CM

## 2017-01-01 NOTE — Telephone Encounter (Signed)
Medication refill request: metformin  Last AEX:  10-02-16 Next AEX: 12-14-17  Last MMG (if hormonal medication request): 06-17-13 WNL  Refill authorized: please advise

## 2017-02-08 ENCOUNTER — Ambulatory Visit (INDEPENDENT_AMBULATORY_CARE_PROVIDER_SITE_OTHER): Payer: 59 | Admitting: Family Medicine

## 2017-02-08 ENCOUNTER — Encounter: Payer: Self-pay | Admitting: Family Medicine

## 2017-02-08 VITALS — BP 126/86 | HR 100 | Temp 98.0°F | Resp 16 | Ht 63.0 in | Wt 229.1 lb

## 2017-02-08 DIAGNOSIS — Z1231 Encounter for screening mammogram for malignant neoplasm of breast: Secondary | ICD-10-CM | POA: Diagnosis not present

## 2017-02-08 DIAGNOSIS — I10 Essential (primary) hypertension: Secondary | ICD-10-CM

## 2017-02-08 DIAGNOSIS — E119 Type 2 diabetes mellitus without complications: Secondary | ICD-10-CM

## 2017-02-08 LAB — LIPID PANEL
CHOL/HDL RATIO: 4
Cholesterol: 175 mg/dL (ref 0–200)
HDL: 41.2 mg/dL (ref >39.00)
NonHDL: 134.12
TRIGLYCERIDES: 286 mg/dL — AB (ref 0.0–149.0)
VLDL: 57.2 mg/dL — AB (ref 0.0–40.0)

## 2017-02-08 LAB — CBC WITH DIFFERENTIAL/PLATELET
BASOS ABS: 0.1 10*3/uL (ref 0.0–0.1)
Basophils Relative: 0.9 % (ref 0.0–3.0)
EOS PCT: 3.5 % (ref 0.0–5.0)
Eosinophils Absolute: 0.4 10*3/uL (ref 0.0–0.7)
HCT: 43.3 % (ref 36.0–46.0)
HEMOGLOBIN: 14.3 g/dL (ref 12.0–15.0)
LYMPHS ABS: 2.3 10*3/uL (ref 0.7–4.0)
Lymphocytes Relative: 19.6 % (ref 12.0–46.0)
MCHC: 33.1 g/dL (ref 30.0–36.0)
MCV: 94.2 fl (ref 78.0–100.0)
MONO ABS: 0.8 10*3/uL (ref 0.1–1.0)
MONOS PCT: 6.9 % (ref 3.0–12.0)
NEUTROS PCT: 69.1 % (ref 43.0–77.0)
Neutro Abs: 8 10*3/uL — ABNORMAL HIGH (ref 1.4–7.7)
Platelets: 331 10*3/uL (ref 150.0–400.0)
RBC: 4.59 Mil/uL (ref 3.87–5.11)
RDW: 12.7 % (ref 11.5–15.5)
WBC: 11.6 10*3/uL — AB (ref 4.0–10.5)

## 2017-02-08 LAB — BASIC METABOLIC PANEL
BUN: 10 mg/dL (ref 6–23)
CO2: 30 mEq/L (ref 19–32)
CREATININE: 0.68 mg/dL (ref 0.40–1.20)
Calcium: 9.9 mg/dL (ref 8.4–10.5)
Chloride: 100 mEq/L (ref 96–112)
GFR: 98.91 mL/min (ref >60.00)
GLUCOSE: 111 mg/dL — AB (ref 70–99)
POTASSIUM: 4.1 meq/L (ref 3.5–5.1)
Sodium: 137 mEq/L (ref 135–145)

## 2017-02-08 LAB — HEPATIC FUNCTION PANEL
ALBUMIN: 4.3 g/dL (ref 3.5–5.2)
ALT: 19 U/L (ref 0–35)
AST: 12 U/L (ref 0–37)
Alkaline Phosphatase: 49 U/L (ref 39–117)
BILIRUBIN TOTAL: 0.3 mg/dL (ref 0.2–1.2)
Bilirubin, Direct: 0 mg/dL (ref 0.0–0.3)
Total Protein: 7.1 g/dL (ref 6.0–8.3)

## 2017-02-08 LAB — HEMOGLOBIN A1C: HEMOGLOBIN A1C: 7 % — AB (ref 4.6–6.5)

## 2017-02-08 LAB — LDL CHOLESTEROL, DIRECT: Direct LDL: 88 mg/dL

## 2017-02-08 LAB — TSH: TSH: 1.19 u[IU]/mL (ref 0.35–4.50)

## 2017-02-08 MED ORDER — LISINOPRIL 10 MG PO TABS: 10.0000 mg | ORAL_TABLET | Freq: Every day | ORAL | 1 refills | Status: AC

## 2017-02-08 MED ORDER — METFORMIN HCL ER 500 MG PO TB24: 500.0000 mg | ORAL_TABLET | Freq: Every day | ORAL | 3 refills | Status: DC

## 2017-02-08 NOTE — Assessment & Plan Note (Signed)
Chronic problem.  Stressed need for healthy diet and regular exercise.  Check labs to risk stratify.  Will follow. 

## 2017-02-08 NOTE — Assessment & Plan Note (Signed)
New to provider, ongoing for pt.  Stressed need for daily medication use for both BP control and renal protection.  Discussed need for healthy diet and regular exercise.  Check labs.  No anticipated med changes.

## 2017-02-08 NOTE — Assessment & Plan Note (Signed)
New to provider and pt (despite having elevated A1C x3 yrs).  UTD on eye exam.  Foot exam done today.  On ACE for renal protection.  Reviewed diagnosis w/ pt and need for yearly eye exam, foot exam.  Discussed need for low carb diet and regular exercise.  Since pt has difficulty remembering to take Metformin, will change to extended release Metformin.  Pt expressed understanding and is in agreement w/ plan.  Check labs.  Adjust meds prn

## 2017-02-08 NOTE — Patient Instructions (Signed)
Follow up in 3-4 months to recheck diabetes We'll notify you of your lab results and make any changes if needed Continue to work on healthy diet and regular exercise- you can do it!!! Start the Lisinopril and Metformin once daily in the AM Call with any questions or concerns Welcome!  We're glad to have you!!!

## 2017-02-08 NOTE — Progress Notes (Signed)
   Subjective:    Patient ID: Sherry Case, female    DOB: August 20, 1970, 46 y.o.   MRN: [id number]  HPI New to establish.  Previous MD- [place]  DM- pt's A1C in June was 6.7 but she was unaware that she has been diabetic for at least 3 yrs according to the chart.  She is supposed to be on Metformin 850 BID but admits to frequently forgetting medication and has been out of meds x3-4 weeks.  UTD on eye exam (April or May)  + tingling of hands and feet.  No N/V, abd pain.  HTN- chronic problem, on Lisinopril 10mg  daily w/ good control.  Pt admits that she does not take this regularly either.  No CP, SOB, HAs, visual changes, edema.  Obesity- ongoing issue for pt.  BMI is 40.59.  Walking regularly.  Attempting a 'lower carb' diet.  Health maintenance- overdue for mammogram, UTD on pap   Review of Systems For ROS see HPI     Objective:   Physical Exam  Constitutional: She is oriented to person, place, and time. She appears well-developed and well-nourished. No distress.  obese  HENT:  Head: Normocephalic and atraumatic.  Eyes: Pupils are equal, round, and reactive to light. Conjunctivae and EOM are normal.  Neck: Normal range of motion. Neck supple. No thyromegaly present.  Cardiovascular: Normal rate, regular rhythm, normal heart sounds and intact distal pulses.   No murmur heard. Pulmonary/Chest: Effort normal and breath sounds normal. No respiratory distress.  Abdominal: Soft. She exhibits no distension. There is no tenderness.  Musculoskeletal: She exhibits no edema.  Lymphadenopathy:    She has no cervical adenopathy.  Neurological: She is alert and oriented to person, place, and time.  Skin: Skin is warm and dry.  Psychiatric: She has a normal mood and affect. Her behavior is normal.  Vitals reviewed.         Assessment & Plan:

## 2017-02-09 ENCOUNTER — Other Ambulatory Visit: Payer: Self-pay | Admitting: General Practice

## 2017-02-09 MED ORDER — FENOFIBRATE 160 MG PO TABS: 160.0000 mg | ORAL_TABLET | Freq: Every day | ORAL | 6 refills | Status: DC

## 2017-04-09 ENCOUNTER — Encounter: Payer: Self-pay | Admitting: Physician Assistant

## 2017-04-09 ENCOUNTER — Ambulatory Visit (INDEPENDENT_AMBULATORY_CARE_PROVIDER_SITE_OTHER): Payer: 59 | Admitting: Physician Assistant

## 2017-04-09 VITALS — BP 140/84 | HR 97 | Temp 98.3°F | Resp 14 | Ht 63.0 in | Wt 228.0 lb

## 2017-04-09 DIAGNOSIS — B9689 Other specified bacterial agents as the cause of diseases classified elsewhere: Secondary | ICD-10-CM | POA: Diagnosis not present

## 2017-04-09 DIAGNOSIS — J019 Acute sinusitis, unspecified: Secondary | ICD-10-CM

## 2017-04-09 MED ORDER — AMOXICILLIN-POT CLAVULANATE 875-125 MG PO TABS: 1.0000 | ORAL_TABLET | Freq: Two times a day (BID) | ORAL | 0 refills | Status: DC

## 2017-04-09 NOTE — Patient Instructions (Signed)
Please take antibiotic as directed.  Increase fluid intake.  Use Saline nasal spray.  Take a daily multivitamin. Continue OTC medications.  Place a humidifier in the bedroom.  Please call or return clinic if symptoms are not improving.  Sinusitis Sinusitis is redness, soreness, and swelling (inflammation) of the paranasal sinuses. Paranasal sinuses are air pockets within the bones of your face (beneath the eyes, the middle of the forehead, or above the eyes). In healthy paranasal sinuses, mucus is able to drain out, and air is able to circulate through them by way of your nose. However, when your paranasal sinuses are inflamed, mucus and air can become trapped. This can allow bacteria and other germs to grow and cause infection. Sinusitis can develop quickly and last only a short time (acute) or continue over a long period (chronic). Sinusitis that lasts for more than 12 weeks is considered chronic.  CAUSES  Causes of sinusitis include:  Allergies.  Structural abnormalities, such as displacement of the cartilage that separates your nostrils (deviated septum), which can decrease the air flow through your nose and sinuses and affect sinus drainage.  Functional abnormalities, such as when the small hairs (cilia) that line your sinuses and help remove mucus do not work properly or are not present. SYMPTOMS  Symptoms of acute and chronic sinusitis are the same. The primary symptoms are pain and pressure around the affected sinuses. Other symptoms include:  Upper toothache.  Earache.  Headache.  Bad breath.  Decreased sense of smell and taste.  A cough, which worsens when you are lying flat.  Fatigue.  Fever.  Thick drainage from your nose, which often is green and may contain pus (purulent).  Swelling and warmth over the affected sinuses. DIAGNOSIS  Your caregiver will perform a physical exam. During the exam, your caregiver may:  Look in your nose for signs of abnormal growths in  your nostrils (nasal polyps).  Tap over the affected sinus to check for signs of infection.  View the inside of your sinuses (endoscopy) with a special imaging device with a light attached (endoscope), which is inserted into your sinuses. If your caregiver suspects that you have chronic sinusitis, one or more of the following tests may be recommended:  Allergy tests.  Nasal culture A sample of mucus is taken from your nose and sent to a lab and screened for bacteria.  Nasal cytology A sample of mucus is taken from your nose and examined by your caregiver to determine if your sinusitis is related to an allergy. TREATMENT  Most cases of acute sinusitis are related to a viral infection and will resolve on their own within 10 days. Sometimes medicines are prescribed to help relieve symptoms (pain medicine, decongestants, nasal steroid sprays, or saline sprays).  However, for sinusitis related to a bacterial infection, your caregiver will prescribe antibiotic medicines. These are medicines that will help kill the bacteria causing the infection.  Rarely, sinusitis is caused by a fungal infection. In theses cases, your caregiver will prescribe antifungal medicine. For some cases of chronic sinusitis, surgery is needed. Generally, these are cases in which sinusitis recurs more than 3 times per year, despite other treatments. HOME CARE INSTRUCTIONS   Drink plenty of water. Water helps thin the mucus so your sinuses can drain more easily.  Use a humidifier.  Inhale steam 3 to 4 times a day (for example, sit in the bathroom with the shower running).  Apply a warm, moist washcloth to your face 3 to 4  times a day, or as directed by your caregiver.  Use saline nasal sprays to help moisten and clean your sinuses.  Take over-the-counter or prescription medicines for pain, discomfort, or fever only as directed by your caregiver. SEEK IMMEDIATE MEDICAL CARE IF:  You have increasing pain or severe  headaches.  You have nausea, vomiting, or drowsiness.  You have swelling around your face.  You have vision problems.  You have a stiff neck.  You have difficulty breathing. MAKE SURE YOU:   Understand these instructions.  Will watch your condition.  Will get help right away if you are not doing well or get worse. Document Released: 04/10/2005 Document Revised: 07/03/2011 Document Reviewed: 04/25/2011 [employer] Patient Information 2014 [employer].   

## 2017-04-09 NOTE — Progress Notes (Signed)
Patient presents to clinic today c/o sore throat, R ear pain and pressure, sinus pressure and nasal congestion over the past week. Denies recent travel. Does teach pre-school. States she was seen Saturday at an Urgent care at which time strep test was obtained and negative. She was given a steroid shot which helped symptoms for that day. Since then she has had worsening symptoms, now with sinus pain and gum pain.  Past Medical History:  Diagnosis Date  . Anemia due to chronic blood loss 01/12/2014  . Colon polyps 2014   history  hyperplastic  . Diabetes mellitus without complication ([place])   . GERD (gastroesophageal reflux disease)    occasional - no meds - diet controlled  . Headache(784.0)    otc med prn  . History of chicken pox   . Hypertension    borderline - no meds  . Leukocytosis 05/2016  . PCOS (polycystic ovarian syndrome)    tx with metformin  . PONV (postoperative nausea and vomiting)   . SVD (spontaneous vaginal delivery)    x 3    Current Outpatient Medications on File Prior to Visit  Medication Sig Dispense Refill  . fenofibrate 160 MG tablet Take 1 tablet (160 mg total) by mouth daily. 30 tablet 6  . lisinopril (PRINIVIL) 10 MG tablet Take 1 tablet (10 mg total) by mouth daily. 90 tablet 1  . metFORMIN (GLUCOPHAGE-XR) 500 MG 24 hr tablet Take 1 tablet (500 mg total) by mouth daily with breakfast. 30 tablet 3   No current facility-administered medications on file prior to visit.     No Known Allergies  Family History  Problem Relation Age of Onset  . Colon cancer Mother 68       stage 1  . Diabetes Mother   . Hypertension Mother   . Lupus Mother   . Raynaud syndrome Mother   . Hearing loss Father   . Healthy Brother   . Lymphoma Paternal Grandmother   . Cancer Paternal Grandmother        breast  . Breast cancer Cousin   . Cancer Maternal Aunt        lung  . Alcohol abuse Maternal Grandfather   . Heart disease Maternal Grandfather   . Heart attack  Maternal Grandfather   . Stroke Paternal Grandfather     Social History   Socioeconomic History  . Marital status: Married    Spouse name: None  . Number of children: None  . Years of education: None  . Highest education level: None  Social Needs  . Financial resource strain: None  . Food insecurity - worry: None  . Food insecurity - inability: None  . Transportation needs - medical: None  . Transportation needs - non-medical: None  Occupational History  . None  Tobacco Use  . Smoking status: Never Smoker  . Smokeless tobacco: Never Used  Substance and Sexual Activity  . Alcohol use: No  . Drug use: No  . Sexual activity: Yes    Birth control/protection: Surgical  Other Topics Concern  . None  Social History Narrative  . None    Review of Systems - See HPI.  All other ROS are negative.  BP 140/84   Pulse 97   Temp 98.3 F (36.8 C) (Oral)   Resp 14   Ht 5\' 3"  (1.6 m)   Wt 228 lb (103.4 kg)   LMP 09/05/2013   SpO2 98%   BMI 40.39 kg/m   Physical Exam  Constitutional: She is oriented to person, place, and time and well-developed, well-nourished, and in no distress.  HENT:  Head: Normocephalic and atraumatic.  Right Ear: Tympanic membrane normal.  Left Ear: Tympanic membrane normal.  Nose: Mucosal edema and rhinorrhea present. Right sinus exhibits frontal sinus tenderness.  Mouth/Throat: Uvula is midline, oropharynx is clear and moist and mucous membranes are normal.  Eyes: Conjunctivae are normal.  Neck: Neck supple.  Cardiovascular: Normal rate, regular rhythm, normal heart sounds and intact distal pulses.  Pulmonary/Chest: Effort normal and breath sounds normal. No respiratory distress. She has no wheezes. She has no rales. She exhibits no tenderness.  Neurological: She is alert and oriented to person, place, and time.  Skin: Skin is warm and dry. No rash noted.  Vitals reviewed.   Recent Results (from the past 2160 hour(s))  Hemoglobin A1c     Status:  Abnormal   Collection Time: 02/08/17  2:27 PM  Result Value Ref Range   Hgb A1c MFr Bld 7.0 (H) 4.6 - 6.5 %    Comment: Glycemic Control Guidelines for People with Diabetes:Non Diabetic:  <6%Goal of Therapy: <7%Additional Action Suggested:  >8%   Lipid panel     Status: Abnormal   Collection Time: 02/08/17  2:27 PM  Result Value Ref Range   Cholesterol 175 0 - 200 mg/dL    Comment: ATP III Classification       Desirable:  < 200 mg/dL               Borderline High:  200 - 239 mg/dL          High:  > = 240 mg/dL   Triglycerides 286.0 (H) 0.0 - 149.0 mg/dL    Comment: Normal:  <150 mg/dLBorderline High:  150 - 199 mg/dL   HDL 41.20 >39.00 mg/dL   VLDL 57.2 (H) 0.0 - 40.0 mg/dL   Total CHOL/HDL Ratio 4     Comment:                Men          Women1/2 Average Risk     3.4          3.3Average Risk          5.0          4.42X Average Risk          9.6          7.13X Average Risk          15.0          11.0                       NonHDL 134.12     Comment: NOTE:  Non-HDL goal should be 30 mg/dL higher than patient's LDL goal (i.e. LDL goal of < 70 mg/dL, would have non-HDL goal of < 100 mg/dL)  Basic metabolic panel     Status: Abnormal   Collection Time: 02/08/17  2:27 PM  Result Value Ref Range   Sodium 137 135 - 145 mEq/L   Potassium 4.1 3.5 - 5.1 mEq/L   Chloride 100 96 - 112 mEq/L   CO2 30 19 - 32 mEq/L   Glucose, Bld 111 (H) 70 - 99 mg/dL   BUN 10 6 - 23 mg/dL   Creatinine, Ser 0.68 0.40 - 1.20 mg/dL   Calcium 9.9 8.4 - 10.5 mg/dL   GFR 98.91 >60.00 mL/min  Hepatic function panel  Status: None   Collection Time: 02/08/17  2:27 PM  Result Value Ref Range   Total Bilirubin 0.3 0.2 - 1.2 mg/dL   Bilirubin, Direct 0.0 0.0 - 0.3 mg/dL   Alkaline Phosphatase 49 39 - 117 U/L   AST 12 0 - 37 U/L   ALT 19 0 - 35 U/L   Total Protein 7.1 6.0 - 8.3 g/dL   Albumin 4.3 3.5 - 5.2 g/dL  TSH     Status: None   Collection Time: 02/08/17  2:27 PM  Result Value Ref Range   TSH 1.19 0.35 -  4.50 uIU/mL  CBC with Differential/Platelet     Status: Abnormal   Collection Time: 02/08/17  2:27 PM  Result Value Ref Range   WBC 11.6 (H) 4.0 - 10.5 K/uL   RBC 4.59 3.87 - 5.11 Mil/uL   Hemoglobin 14.3 12.0 - 15.0 g/dL   HCT 43.3 36.0 - 46.0 %   MCV 94.2 78.0 - 100.0 fl   MCHC 33.1 30.0 - 36.0 g/dL   RDW 12.7 11.5 - 15.5 %   Platelets 331.0 150.0 - 400.0 K/uL   Neutrophils Relative % 69.1 43.0 - 77.0 %   Lymphocytes Relative 19.6 12.0 - 46.0 %   Monocytes Relative 6.9 3.0 - 12.0 %   Eosinophils Relative 3.5 0.0 - 5.0 %   Basophils Relative 0.9 0.0 - 3.0 %   Neutro Abs 8.0 (H) 1.4 - 7.7 K/uL   Lymphs Abs 2.3 0.7 - 4.0 K/uL   Monocytes Absolute 0.8 0.1 - 1.0 K/uL   Eosinophils Absolute 0.4 0.0 - 0.7 K/uL   Basophils Absolute 0.1 0.0 - 0.1 K/uL  LDL cholesterol, direct     Status: None   Collection Time: 02/08/17  2:27 PM  Result Value Ref Range   Direct LDL 88.0 mg/dL    Comment: Optimal:  <100 mg/dLNear or Above Optimal:  100-129 mg/dLBorderline High:  130-159 mg/dLHigh:  160-189 mg/dLVery High:  >190 mg/dL    Assessment/Plan: 1. Acute bacterial sinusitis Rx Augmentin.  Increase fluids.  Rest.  Saline nasal spray.  Probiotic.  Mucinex as directed.  Humidifier in bedroom.  Call or return to clinic if symptoms are not improving.  - amoxicillin-clavulanate (AUGMENTIN) 875-125 MG tablet; Take 1 tablet by mouth 2 (two) times daily.  Dispense: 14 tablet; Refill: 0   Leeanne Rio, PA-C

## 2017-04-20 ENCOUNTER — Other Ambulatory Visit: Payer: Self-pay | Admitting: *Deleted

## 2017-04-20 DIAGNOSIS — E781 Pure hyperglyceridemia: Secondary | ICD-10-CM

## 2017-04-20 MED ORDER — FENOFIBRATE 160 MG PO TABS: 160.0000 mg | ORAL_TABLET | Freq: Every day | ORAL | 1 refills | Status: AC

## 2017-04-23 ENCOUNTER — Other Ambulatory Visit: Payer: Self-pay | Admitting: General Practice

## 2017-04-23 MED ORDER — METFORMIN HCL ER 500 MG PO TB24: 500.0000 mg | ORAL_TABLET | Freq: Every day | ORAL | 0 refills | Status: AC

## 2017-04-27 ENCOUNTER — Ambulatory Visit (INDEPENDENT_AMBULATORY_CARE_PROVIDER_SITE_OTHER): Payer: 59 | Admitting: Physician Assistant

## 2017-04-27 ENCOUNTER — Encounter: Payer: Self-pay | Admitting: Physician Assistant

## 2017-04-27 VITALS — BP 132/90 | HR 108 | Temp 98.7°F | Resp 14 | Ht 63.0 in | Wt 230.0 lb

## 2017-04-27 DIAGNOSIS — H6982 Other specified disorders of Eustachian tube, left ear: Secondary | ICD-10-CM | POA: Diagnosis not present

## 2017-04-27 DIAGNOSIS — R058 Other specified cough: Secondary | ICD-10-CM

## 2017-04-27 DIAGNOSIS — R05 Cough: Secondary | ICD-10-CM

## 2017-04-27 DIAGNOSIS — H6992 Unspecified Eustachian tube disorder, left ear: Secondary | ICD-10-CM

## 2017-04-27 MED ORDER — BENZONATATE 100 MG PO CAPS
100.0000 mg | ORAL_CAPSULE | Freq: Two times a day (BID) | ORAL | 0 refills | Status: DC | PRN
Start: 2017-04-27 — End: 2017-05-14

## 2017-04-27 MED ORDER — FLUTICASONE PROPIONATE 50 MCG/ACT NA SUSP: 2.0000 | Freq: Every day | NASAL | 0 refills | Status: AC

## 2017-04-27 NOTE — Progress Notes (Signed)
Patient presents to clinic today c/o 3 weeks of cough s/p treatment for sinus infection. Notes complete resolution of sinus pain and tenderness. Cough is dry. Notes mild sore throat 2/2 cough. Notes left ear pressure still with popping. Denies right ear pressure or pain. Denies fever, chills, chest congestion, chest pain or SOB. Is not taking anything for symptoms.  Past Medical History:  Diagnosis Date  . Anemia due to chronic blood loss 01/12/2014  . Colon polyps 2014   history  hyperplastic  . Diabetes mellitus without complication ([place])   . GERD (gastroesophageal reflux disease)    occasional - no meds - diet controlled  . Headache(784.0)    otc med prn  . History of chicken pox   . Hypertension    borderline - no meds  . Leukocytosis 05/2016  . PCOS (polycystic ovarian syndrome)    tx with metformin  . PONV (postoperative nausea and vomiting)   . SVD (spontaneous vaginal delivery)    x 3    Current Outpatient Medications on File Prior to Visit  Medication Sig Dispense Refill  . fenofibrate 160 MG tablet Take 1 tablet (160 mg total) by mouth daily. 90 tablet 1  . lisinopril (PRINIVIL) 10 MG tablet Take 1 tablet (10 mg total) by mouth daily. 90 tablet 1  . metFORMIN (GLUCOPHAGE-XR) 500 MG 24 hr tablet Take 1 tablet (500 mg total) by mouth daily with breakfast. 90 tablet 0   No current facility-administered medications on file prior to visit.     No Known Allergies  Family History  Problem Relation Age of Onset  . Colon cancer Mother 51       stage 1  . Diabetes Mother   . Hypertension Mother   . Lupus Mother   . Raynaud syndrome Mother   . Hearing loss Father   . Healthy Brother   . Lymphoma Paternal Grandmother   . Cancer Paternal Grandmother        breast  . Breast cancer Cousin   . Cancer Maternal Aunt        lung  . Alcohol abuse Maternal Grandfather   . Heart disease Maternal Grandfather   . Heart attack Maternal Grandfather   . Stroke Paternal  Grandfather     Social History   Socioeconomic History  . Marital status: Married    Spouse name: None  . Number of children: None  . Years of education: None  . Highest education level: None  Social Needs  . Financial resource strain: None  . Food insecurity - worry: None  . Food insecurity - inability: None  . Transportation needs - medical: None  . Transportation needs - non-medical: None  Occupational History  . None  Tobacco Use  . Smoking status: Never Smoker  . Smokeless tobacco: Never Used  Substance and Sexual Activity  . Alcohol use: No  . Drug use: No  . Sexual activity: Yes    Birth control/protection: Surgical  Other Topics Concern  . None  Social History Narrative  . None   Review of Systems - See HPI.  All other ROS are negative.  BP 132/90   Pulse (!) 108   Temp 98.7 F (37.1 C) (Oral)   Resp 14   Ht 5\' 3"  (1.6 m)   Wt 230 lb (104.3 kg)   LMP 09/05/2013   SpO2 98%   BMI 40.74 kg/m   Physical Exam  Constitutional: She is oriented to person, place, and time and well-developed,  well-nourished, and in no distress.  HENT:  Head: Normocephalic and atraumatic.  Right Ear: Tympanic membrane normal.  Left Ear: Left ear middle ear effusion: serous.  Nose: Nose normal. Right sinus exhibits no maxillary sinus tenderness and no frontal sinus tenderness. Left sinus exhibits no maxillary sinus tenderness and no frontal sinus tenderness.  Mouth/Throat: Uvula is midline, oropharynx is clear and moist and mucous membranes are normal.  Neck: Neck supple.  Cardiovascular: Normal rate, regular rhythm, normal heart sounds and intact distal pulses.  Pulmonary/Chest: Effort normal and breath sounds normal. No respiratory distress. She has no wheezes. She has no rales. She exhibits no tenderness.  Lymphadenopathy:    She has no cervical adenopathy.  Neurological: She is alert and oriented to person, place, and time.  Skin: Skin is warm and dry. No rash noted.    Psychiatric: Affect normal.  Vitals reviewed.  Recent Results (from the past 2160 hour(s))  Hemoglobin A1c     Status: Abnormal   Collection Time: 02/08/17  2:27 PM  Result Value Ref Range   Hgb A1c MFr Bld 7.0 (H) 4.6 - 6.5 %    Comment: Glycemic Control Guidelines for People with Diabetes:Non Diabetic:  <6%Goal of Therapy: <7%Additional Action Suggested:  >8%   Lipid panel     Status: Abnormal   Collection Time: 02/08/17  2:27 PM  Result Value Ref Range   Cholesterol 175 0 - 200 mg/dL    Comment: ATP III Classification       Desirable:  < 200 mg/dL               Borderline High:  200 - 239 mg/dL          High:  > = 240 mg/dL   Triglycerides 286.0 (H) 0.0 - 149.0 mg/dL    Comment: Normal:  <150 mg/dLBorderline High:  150 - 199 mg/dL   HDL 41.20 >39.00 mg/dL   VLDL 57.2 (H) 0.0 - 40.0 mg/dL   Total CHOL/HDL Ratio 4     Comment:                Men          Women1/2 Average Risk     3.4          3.3Average Risk          5.0          4.42X Average Risk          9.6          7.13X Average Risk          15.0          11.0                       NonHDL 134.12     Comment: NOTE:  Non-HDL goal should be 30 mg/dL higher than patient's LDL goal (i.e. LDL goal of < 70 mg/dL, would have non-HDL goal of < 100 mg/dL)  Basic metabolic panel     Status: Abnormal   Collection Time: 02/08/17  2:27 PM  Result Value Ref Range   Sodium 137 135 - 145 mEq/L   Potassium 4.1 3.5 - 5.1 mEq/L   Chloride 100 96 - 112 mEq/L   CO2 30 19 - 32 mEq/L   Glucose, Bld 111 (H) 70 - 99 mg/dL   BUN 10 6 - 23 mg/dL   Creatinine, Ser 0.68 0.40 - 1.20 mg/dL   Calcium 9.9 8.4 -  10.5 mg/dL   GFR 98.91 >60.00 mL/min  Hepatic function panel     Status: None   Collection Time: 02/08/17  2:27 PM  Result Value Ref Range   Total Bilirubin 0.3 0.2 - 1.2 mg/dL   Bilirubin, Direct 0.0 0.0 - 0.3 mg/dL   Alkaline Phosphatase 49 39 - 117 U/L   AST 12 0 - 37 U/L   ALT 19 0 - 35 U/L   Total Protein 7.1 6.0 - 8.3 g/dL   Albumin  4.3 3.5 - 5.2 g/dL  TSH     Status: None   Collection Time: 02/08/17  2:27 PM  Result Value Ref Range   TSH 1.19 0.35 - 4.50 uIU/mL  CBC with Differential/Platelet     Status: Abnormal   Collection Time: 02/08/17  2:27 PM  Result Value Ref Range   WBC 11.6 (H) 4.0 - 10.5 K/uL   RBC 4.59 3.87 - 5.11 Mil/uL   Hemoglobin 14.3 12.0 - 15.0 g/dL   HCT 43.3 36.0 - 46.0 %   MCV 94.2 78.0 - 100.0 fl   MCHC 33.1 30.0 - 36.0 g/dL   RDW 12.7 11.5 - 15.5 %   Platelets 331.0 150.0 - 400.0 K/uL   Neutrophils Relative % 69.1 43.0 - 77.0 %   Lymphocytes Relative 19.6 12.0 - 46.0 %   Monocytes Relative 6.9 3.0 - 12.0 %   Eosinophils Relative 3.5 0.0 - 5.0 %   Basophils Relative 0.9 0.0 - 3.0 %   Neutro Abs 8.0 (H) 1.4 - 7.7 K/uL   Lymphs Abs 2.3 0.7 - 4.0 K/uL   Monocytes Absolute 0.8 0.1 - 1.0 K/uL   Eosinophils Absolute 0.4 0.0 - 0.7 K/uL   Basophils Absolute 0.1 0.0 - 0.1 K/uL  LDL cholesterol, direct     Status: None   Collection Time: 02/08/17  2:27 PM  Result Value Ref Range   Direct LDL 88.0 mg/dL    Comment: Optimal:  <100 mg/dLNear or Above Optimal:  100-129 mg/dLBorderline High:  130-159 mg/dLHigh:  160-189 mg/dLVery High:  >190 mg/dL   Assessment/Plan: 1. Eustachian tube dysfunction, left 2. Dry cough Start Flonase and Xyzal. Saline nasal rinses. Start Tessalon for follow-up. Close follow-up scheduled.   Leeanne Rio, PA-C

## 2017-04-27 NOTE — Patient Instructions (Addendum)
Please stay well-hydrated and get plenty of rest. Keep a well-balanced diet.  Start Flonase daily as directed. Also start a daily OTC Xyzal.  I am sending in [place] for you to take as directed for cough. Place a humidifier in the bedroom.   Follow-up if symptoms are not resolving.

## 2017-05-14 ENCOUNTER — Ambulatory Visit (INDEPENDENT_AMBULATORY_CARE_PROVIDER_SITE_OTHER): Payer: 59 | Admitting: Family Medicine

## 2017-05-14 ENCOUNTER — Encounter: Payer: Self-pay | Admitting: Family Medicine

## 2017-05-14 ENCOUNTER — Other Ambulatory Visit: Payer: Self-pay

## 2017-05-14 VITALS — BP 124/82 | HR 103 | Temp 98.6°F | Resp 16 | Ht 63.0 in | Wt 227.2 lb

## 2017-05-14 DIAGNOSIS — E119 Type 2 diabetes mellitus without complications: Secondary | ICD-10-CM | POA: Diagnosis not present

## 2017-05-14 DIAGNOSIS — E781 Pure hyperglyceridemia: Secondary | ICD-10-CM | POA: Diagnosis not present

## 2017-05-14 DIAGNOSIS — R5383 Other fatigue: Secondary | ICD-10-CM | POA: Diagnosis not present

## 2017-05-14 LAB — CBC WITH DIFFERENTIAL/PLATELET
BASOS PCT: 0.5 % (ref 0.0–3.0)
Basophils Absolute: 0 10*3/uL (ref 0.0–0.1)
EOS PCT: 3.3 % (ref 0.0–5.0)
Eosinophils Absolute: 0.3 10*3/uL (ref 0.0–0.7)
HEMATOCRIT: 42.6 % (ref 36.0–46.0)
HEMOGLOBIN: 14.4 g/dL (ref 12.0–15.0)
LYMPHS PCT: 12.9 % (ref 12.0–46.0)
Lymphs Abs: 1 10*3/uL (ref 0.7–4.0)
MCHC: 33.8 g/dL (ref 30.0–36.0)
MCV: 93 fl (ref 78.0–100.0)
Monocytes Absolute: 0.7 10*3/uL (ref 0.1–1.0)
Monocytes Relative: 8.7 % (ref 3.0–12.0)
Neutro Abs: 5.8 10*3/uL (ref 1.4–7.7)
Neutrophils Relative %: 74.6 % (ref 43.0–77.0)
Platelets: 330 10*3/uL (ref 150.0–400.0)
RBC: 4.58 Mil/uL (ref 3.87–5.11)
RDW: 13.5 % (ref 11.5–15.5)
WBC: 7.8 10*3/uL (ref 4.0–10.5)

## 2017-05-14 LAB — LIPID PANEL
CHOL/HDL RATIO: 4
Cholesterol: 155 mg/dL (ref 0–200)
HDL: 35.9 mg/dL — ABNORMAL LOW (ref >39.00)
LDL CALC: 96 mg/dL (ref 0–99)
NONHDL: 119.18
Triglycerides: 116 mg/dL (ref 0.0–149.0)
VLDL: 23.2 mg/dL (ref 0.0–40.0)

## 2017-05-14 LAB — HEPATIC FUNCTION PANEL
ALT: 44 U/L — ABNORMAL HIGH (ref 0–35)
AST: 30 U/L (ref 0–37)
Albumin: 4.2 g/dL (ref 3.5–5.2)
Alkaline Phosphatase: 45 U/L (ref 39–117)
BILIRUBIN TOTAL: 0.5 mg/dL (ref 0.2–1.2)
Bilirubin, Direct: 0.1 mg/dL (ref 0.0–0.3)
Total Protein: 6.8 g/dL (ref 6.0–8.3)

## 2017-05-14 LAB — BASIC METABOLIC PANEL
BUN: 10 mg/dL (ref 6–23)
CHLORIDE: 100 meq/L (ref 96–112)
CO2: 27 meq/L (ref 19–32)
Calcium: 9.5 mg/dL (ref 8.4–10.5)
Creatinine, Ser: 0.67 mg/dL (ref 0.40–1.20)
GFR: 100.5 mL/min (ref >60.00)
GLUCOSE: 198 mg/dL — AB (ref 70–99)
POTASSIUM: 3.8 meq/L (ref 3.5–5.1)
Sodium: 136 mEq/L (ref 135–145)

## 2017-05-14 LAB — HEMOGLOBIN A1C: HEMOGLOBIN A1C: 7.2 % — AB (ref 4.6–6.5)

## 2017-05-14 LAB — VITAMIN D 25 HYDROXY (VIT D DEFICIENCY, FRACTURES): VITD: 16.39 ng/mL — ABNORMAL LOW (ref 30.00–100.00)

## 2017-05-14 LAB — TSH: TSH: 0.97 u[IU]/mL (ref 0.35–4.50)

## 2017-05-14 NOTE — Assessment & Plan Note (Signed)
Noted at last visit.  Tolerating Fenofibrate w/o difficulty.  Check labs.  Adjust meds prn

## 2017-05-14 NOTE — Progress Notes (Signed)
   Subjective:    Patient ID: Sherry Case, female    DOB: 1970/12/31, 47 y.o.   MRN: [id number]  HPI DM- chronic problem.  On Metformin 500mg  daily.  UTD on foot exam, eye exam.  On ACE for renal protection.  + fatigue- 'i'm sick and tired of being sick and tired'.  Pt is down 3 lbs from earlier this month.  Rare symptomatic lows.  No CP, SOB, HAs, visual changes, edema.  No numbness/tingling of hands/feet.  No regular exercise.  Hypertriglyceridemia- started on Fenofibrate at last visit.  Denies abd pain, N/V.   Review of Systems For ROS see HPI     Objective:   Physical Exam  Constitutional: She is oriented to person, place, and time. She appears well-developed and well-nourished. No distress.  obese  HENT:  Head: Normocephalic and atraumatic.  Eyes: Conjunctivae and EOM are normal. Pupils are equal, round, and reactive to light.  Neck: Normal range of motion. Neck supple. No thyromegaly present.  Cardiovascular: Normal rate, regular rhythm, normal heart sounds and intact distal pulses.  No murmur heard. Pulmonary/Chest: Effort normal and breath sounds normal. No respiratory distress.  Abdominal: Soft. She exhibits no distension. There is no tenderness.  Musculoskeletal: She exhibits no edema.  Lymphadenopathy:    She has no cervical adenopathy.  Neurological: She is alert and oriented to person, place, and time.  Skin: Skin is warm and dry.  Psychiatric: She has a normal mood and affect. Her behavior is normal.  Vitals reviewed.         Assessment & Plan:  Fatigue- new to provider but ongoing for pt.  'i'm sick and tired of being sick and tired'.  Check labs to assess for metabolic cause.  Will follow.

## 2017-05-14 NOTE — Assessment & Plan Note (Signed)
Chronic problem.  On Metformin XR 500mg  daily.  UTD on foot exam, eye exam, and on ACE for renal protection.  Stressed need for healthy diet and regular exercise.  Check labs.  Adjust meds prn

## 2017-05-14 NOTE — Patient Instructions (Signed)
Follow up in 3-4 months to recheck diabetes We'll notify you of your lab results and make any changes if needed Continue to work on healthy diet and regular exercise- you can do it!! Call with any questions or concerns Happy New Year!!! 

## 2017-05-15 ENCOUNTER — Other Ambulatory Visit: Payer: Self-pay | Admitting: General Practice

## 2017-05-15 DIAGNOSIS — R945 Abnormal results of liver function studies: Principal | ICD-10-CM

## 2017-05-15 DIAGNOSIS — R7989 Other specified abnormal findings of blood chemistry: Secondary | ICD-10-CM

## 2017-05-15 MED ORDER — VITAMIN D (ERGOCALCIFEROL) 1.25 MG (50000 UNIT) PO CAPS: 50000.0000 [IU] | ORAL_CAPSULE | ORAL | 0 refills | Status: DC

## 2017-05-30 ENCOUNTER — Other Ambulatory Visit (INDEPENDENT_AMBULATORY_CARE_PROVIDER_SITE_OTHER): Payer: 59

## 2017-05-30 DIAGNOSIS — R7989 Other specified abnormal findings of blood chemistry: Secondary | ICD-10-CM

## 2017-05-30 DIAGNOSIS — R945 Abnormal results of liver function studies: Secondary | ICD-10-CM | POA: Diagnosis not present

## 2017-05-30 LAB — HEPATIC FUNCTION PANEL
ALK PHOS: 47 U/L (ref 39–117)
ALT: 34 U/L (ref 0–35)
AST: 20 U/L (ref 0–37)
Albumin: 4.2 g/dL (ref 3.5–5.2)
BILIRUBIN DIRECT: 0.1 mg/dL (ref 0.0–0.3)
BILIRUBIN TOTAL: 0.3 mg/dL (ref 0.2–1.2)
Total Protein: 7.2 g/dL (ref 6.0–8.3)

## 2017-05-31 ENCOUNTER — Encounter: Payer: Self-pay | Admitting: General Practice

## 2017-08-20 ENCOUNTER — Ambulatory Visit: Payer: 59 | Admitting: Family Medicine

## 2017-08-29 ENCOUNTER — Encounter: Payer: Self-pay | Admitting: Family Medicine

## 2017-08-29 ENCOUNTER — Other Ambulatory Visit: Payer: Self-pay

## 2017-08-29 ENCOUNTER — Ambulatory Visit: Payer: 59 | Admitting: Family Medicine

## 2017-08-29 ENCOUNTER — Ambulatory Visit (INDEPENDENT_AMBULATORY_CARE_PROVIDER_SITE_OTHER): Payer: 59 | Admitting: Family Medicine

## 2017-08-29 VITALS — BP 122/80 | HR 99 | Temp 98.3°F | Resp 16 | Ht 63.0 in | Wt 228.4 lb

## 2017-08-29 DIAGNOSIS — R5383 Other fatigue: Secondary | ICD-10-CM

## 2017-08-29 DIAGNOSIS — E119 Type 2 diabetes mellitus without complications: Secondary | ICD-10-CM

## 2017-08-29 NOTE — Progress Notes (Signed)
   Subjective:    Patient ID: Sherry Case, female    DOB: Feb 28, 1971, 47 y.o.   MRN: [id number]  HPI DM- chronic problem.  On metformin 500mg  daily.  On ACE for renal protection.  UTD on foot exam.  Due for foot exam.  + fatigue but pt is in process of moving.  Denies CP, SOB, HAs, visual changes.  + numbness of hands coming from neck.  No numbness of feet.  Denies symptomatic lows.   Review of Systems For ROS see HPI     Objective:   Physical Exam  Constitutional: She is oriented to person, place, and time. She appears well-developed and well-nourished. No distress.  obese  HENT:  Head: Normocephalic and atraumatic.  Eyes: Pupils are equal, round, and reactive to light. Conjunctivae and EOM are normal.  Neck: Normal range of motion. Neck supple. No thyromegaly present.  Cardiovascular: Normal rate, regular rhythm, normal heart sounds and intact distal pulses.  No murmur heard. Pulmonary/Chest: Effort normal and breath sounds normal. No respiratory distress.  Abdominal: Soft. She exhibits no distension. There is no tenderness.  Musculoskeletal: She exhibits no edema.  Lymphadenopathy:    She has no cervical adenopathy.  Neurological: She is alert and oriented to person, place, and time.  Skin: Skin is warm and dry.  Psychiatric: She has a normal mood and affect. Her behavior is normal.  Vitals reviewed.         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

## 2017-08-29 NOTE — Patient Instructions (Signed)
Make sure you find a new doctor when you move to [place]! We'll notify you of your lab results and make any changes if needed Continue to work on healthy diet and regular exercise- you can do it! Schedule your eye exam when you have time! Call with any questions or concerns GOOD LUCK!!!

## 2017-08-29 NOTE — Assessment & Plan Note (Signed)
Chronic problem.  Stressed need for healthy diet and regular exercise.  Will follow.

## 2017-08-29 NOTE — Assessment & Plan Note (Signed)
Chronic problem.  Tolerating Metformin w/o difficulty.  Due for eye exam- encouraged pt to schedule.  On ACE for renal protection.  Stressed need for healthy diet and regular exercise.  Check labs.  Adjust meds prn

## 2017-08-30 LAB — HEMOGLOBIN A1C
EAG (MMOL/L): 8.7 (calc)
Hgb A1c MFr Bld: 7.1 % of total Hgb — ABNORMAL HIGH (ref <5.7)
Mean Plasma Glucose: 157 (calc)

## 2017-08-30 LAB — BASIC METABOLIC PANEL
BUN: 13 mg/dL (ref 7–25)
CALCIUM: 9.7 mg/dL (ref 8.6–10.2)
CO2: 25 mmol/L (ref 20–32)
CREATININE: 0.65 mg/dL (ref 0.50–1.10)
Chloride: 103 mmol/L (ref 98–110)
GLUCOSE: 145 mg/dL — AB (ref 65–99)
Potassium: 3.7 mmol/L (ref 3.5–5.3)
SODIUM: 139 mmol/L (ref 135–146)

## 2017-08-30 LAB — B12 AND FOLATE PANEL
FOLATE: 9.1 ng/mL
VITAMIN B 12: 499 pg/mL (ref 200–1100)

## 2017-08-30 LAB — TSH: TSH: 1.46 m[IU]/L

## 2017-12-14 ENCOUNTER — Ambulatory Visit: Payer: 59 | Admitting: Obstetrics & Gynecology

## 2018-06-14 DIAGNOSIS — I10 Essential (primary) hypertension: Secondary | ICD-10-CM

## 2019-05-01 ENCOUNTER — Ambulatory Visit
Admit: 2019-05-01 | Discharge: 2019-05-01 | Payer: BLUE CROSS/BLUE SHIELD | Attending: Family Medicine | Primary: Family Medicine

## 2019-05-01 ENCOUNTER — Encounter: Attending: Family | Primary: Family Medicine

## 2019-05-01 DIAGNOSIS — I10 Essential (primary) hypertension: Secondary | ICD-10-CM

## 2019-05-01 MED ORDER — SPIRONOLACTONE-HCTZ 25-25 MG PO TABS
25-25 | ORAL_TABLET | Freq: Every day | ORAL | 3 refills | Status: DC
Start: 2019-05-01 — End: 2019-10-06

## 2019-05-02 ENCOUNTER — Inpatient Hospital Stay: Payer: BLUE CROSS/BLUE SHIELD | Primary: Family Medicine

## 2019-05-02 DIAGNOSIS — R5383 Other fatigue: Secondary | ICD-10-CM

## 2019-05-02 LAB — BASIC METABOLIC PANEL
Anion Gap: 12 mmol/L (ref 9–17)
BUN: 11 mg/dL (ref 6–20)
CO2: 24 mmol/L (ref 20–31)
Calcium: 9.1 mg/dL (ref 8.6–10.4)
Chloride: 100 mmol/L (ref 98–107)
Creatinine: 0.55 mg/dL (ref 0.50–0.90)
GFR African American: >60 mL/min (ref >60)
GFR Non-African American: >60 mL/min (ref >60)
Glucose: 191 mg/dL — ABNORMAL HIGH (ref 70–99)
Potassium: 4 mmol/L (ref 3.7–5.3)
Sodium: 136 mmol/L (ref 135–144)

## 2019-05-02 LAB — CBC WITH AUTO DIFFERENTIAL
Absolute Eos #: 0.32 10*3/uL (ref 0.00–0.44)
Absolute Immature Granulocyte: 0.04 10*3/uL (ref 0.00–0.30)
Absolute Lymph #: 2.6 10*3/uL (ref 1.10–3.70)
Absolute Mono #: 0.67 10*3/uL (ref 0.10–1.20)
Basophils Absolute: 0.08 10*3/uL (ref 0.00–0.20)
Basophils: 1 % (ref 0–2)
Eosinophils %: 3 % (ref 1–4)
Hematocrit: 43.7 % (ref 36.3–47.1)
Hemoglobin: 13.8 g/dL (ref 11.9–15.1)
Immature Granulocytes: 0 %
Lymphocytes: 26 % (ref 24–43)
MCH: 30.4 pg (ref 25.2–33.5)
MCHC: 31.6 g/dL (ref 28.4–34.8)
MCV: 96.3 fL (ref 82.6–102.9)
MPV: 9.6 fL (ref 8.1–13.5)
Monocytes: 7 % (ref 3–12)
NRBC Automated: 0 per 100 WBC
Platelets: 330 10*3/uL (ref 138–453)
RBC: 4.54 m/uL (ref 3.95–5.11)
RDW: 12.5 % (ref 11.8–14.4)
Seg Neutrophils: 63 % (ref 36–65)
Segs Absolute: 6.47 10*3/uL (ref 1.50–8.10)
WBC: 10.2 10*3/uL (ref 3.5–11.3)

## 2019-05-02 LAB — LIPID PANEL
Chol/HDL Ratio: 5.9 — ABNORMAL HIGH (ref <5)
Cholesterol: 219 mg/dL — ABNORMAL HIGH (ref <200)
HDL: 37 mg/dL — ABNORMAL LOW (ref >40)
Triglycerides: 613 mg/dL — ABNORMAL HIGH (ref <150)

## 2019-05-02 LAB — HEMOGLOBIN A1C
Estimated Avg Glucose: 180 mg/dL
Hemoglobin A1C: 7.9 % — ABNORMAL HIGH (ref 4.0–6.0)

## 2019-05-02 LAB — MICROALBUMIN, UR
Creatinine, Ur: 100.8 mg/dL (ref 28.0–217.0)
Microalb, Ur: 15 mg/L (ref <21)
Microalb/Crt. Ratio: 15 mcg/mg creat (ref <25)

## 2019-05-02 LAB — VITAMIN D 25 HYDROXY: Vit D, 25-Hydroxy: 25.9 ng/mL — ABNORMAL LOW (ref 30.0–100.0)

## 2019-05-02 LAB — TSH: TSH: 1.33 mIU/L (ref 0.30–5.00)

## 2019-05-02 LAB — T4, FREE: Thyroxine, Free: 1.08 ng/dL (ref 0.93–1.70)

## 2019-05-02 LAB — LDL CHOLESTEROL, DIRECT: LDL Direct: 75 mg/dL (ref <100)

## 2019-05-02 NOTE — Progress Notes (Signed)
Subjective:     Patient ID: Catherine Forbes is a 49 y.o. female    HPI  New patient to practice with history of DM, PCOS, HT, weight gain, and COVID seen to establish care  Also c/o fatigue and just not feeling well. 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 Negative for activity change, appetite change, fatigue and fever.   HENT: Negative for sore throat.    Eyes: Negative for visual disturbance.   Respiratory: Negative for cough, chest tightness and shortness of breath.    Cardiovascular: Negative for chest pain, palpitations and leg swelling.   Gastrointestinal: Negative for abdominal pain, constipation and diarrhea.   Endocrine: Negative for cold intolerance.   Genitourinary: Negative for dysuria and urgency.   Musculoskeletal: Negative for back pain.   Neurological: Negative for dizziness, syncope and headaches.   Hematological: Does not bruise/bleed easily.   Psychiatric/Behavioral: Negative for confusion and sleep disturbance. The patient is not nervous/anxious.              Objective:     Physical Exam  Constitutional:       General: She is not in acute distress.     Appearance: Normal appearance. She is obese.   HENT:      Head: Normocephalic.      Right Ear: External ear normal.      Left Ear: External ear normal.      Nose: Nose normal.      Mouth/Throat:      Mouth: Mucous membranes are moist.      Pharynx: Oropharynx is clear.   Eyes:      Conjunctiva/sclera: Conjunctivae normal.   Neck:      Musculoskeletal: Neck supple.      Comments: No thyromegaly  Cardiovascular:      Rate and Rhythm: Normal rate and regular rhythm.      Pulses: Normal pulses.      Heart sounds: Normal heart sounds. No murmur.   Pulmonary:      Effort:  Pulmonary effort is normal.      Breath sounds: Normal breath sounds.   Musculoskeletal: Normal range of motion.      Right lower leg: No edema.      Left lower leg: No edema.   Lymphadenopathy:      Cervical: No cervical adenopathy.   Skin:     General: Skin is warm and dry.      Capillary Refill: Capillary refill takes less than 2 seconds.      Comments: No striae or other signs of Cushing's disease   Neurological:      General: No focal deficit present.      Mental Status: She is alert and oriented to person, place, and time.   Psychiatric:         Mood and Affect: Mood normal.         Behavior: Behavior normal.           Assessment/Plan:     1. Essential hypertension    2. Mixed hyperlipidemia    3. Type 2 diabetes mellitus without complication, without long-term current use of insulin ([place])  4. Polycystic ovary syndrome    5. Fatigue, unspecified type    6. Family history of malignant neoplasm of colon            Lashaune was seen today for establish care, hypertension and other.    Diagnoses and all orders for this visit:    Essential hypertension  Out of control will add Aldactazide  Mixed hyperlipidemia  -     Lipid Panel; Future    Type 2 diabetes mellitus without complication, without long-term current use of insulin ([place])  -     Basic Metabolic Panel; Future  -     Microalbumin, Ur; Future  -     Hemoglobin A1C; Future    Polycystic ovary syndrome  -     Basic Metabolic Panel; Future    Fatigue, unspecified type  -     CBC Auto Differential; Future  -     T4, Free; Future  -     TSH without Reflex; Future  -     Vitamin D 25 Hydroxy; Future    Family history of malignant neoplasm of colon  -     Amb External Referral To General Surgery    Other orders  -     spironolactone-hydroCHLOROthiazide (ALDACTAZIDE) 25-25 MG per tablet; Take 1 tablet by mouth daily    We discussed the anti-inflammatory diet and low glycemic index.    We also discussed carbohydrate to fiber ratio no higher than 10-1.   We discussed  no naked carbohydrates.    Discussed the headspace meditation app.    Discussed 8 fit app for HIIT.    Discussed 150 minutes of moderate exercise weekly  Reinforced need for quality sleep  Specific for the insomnia add magnesium 400 mg 2 hours before bedtime, OLLI an  hour before bedtime, DHA fish oil 1 g in the morning.    WEIGHT LOSS    Recommend 10 % weight loss over 3 months  Low glycemic index diet  Avoid naked carbohydrates, e.g. peanut butter added to small banana  Avoid carbohydrate to fiber ratio more than 10:1  Whole oatmeal at least every other day at breakfast  May have 6 eggs per week  Focus on 25 gms of protein at breakfast  Avoid carbos first thing in morning to limit glucose surge  Limit animal protein  Limit dairy  Spinach salad daily  Try for 25-50 grams of fiber per day    mTOR is the "light switch" for inflammation in the body  Low Glycemic Index diet helps to mitigate sugar spikes and thus limit mTOR activation  Intermittent fasting may also help  Cold exposure (infrequently)  Good sleep  HITT exercises  Happiness    Metformin  Reveratrol  Quercetin  Berberine      Neta Mends, MD    Please note that this chart was generated using voice recognition Dragon dictation software.  Although every effort was made to ensure the accuracy of this automated transcription, some errors in transcription may have occurred.

## 2019-05-15 ENCOUNTER — Ambulatory Visit
Admit: 2019-05-15 | Discharge: 2019-05-15 | Payer: BLUE CROSS/BLUE SHIELD | Attending: Family Medicine | Primary: Family Medicine

## 2019-05-15 DIAGNOSIS — I10 Essential (primary) hypertension: Secondary | ICD-10-CM

## 2019-05-15 MED ORDER — ROSUVASTATIN CALCIUM 10 MG PO TABS
10 MG | ORAL_TABLET | Freq: Every day | ORAL | 3 refills | Status: DC
Start: 2019-05-15 — End: 2019-09-02

## 2019-05-15 NOTE — Progress Notes (Signed)
Subjective:     Patient ID: Catherine Forbes is a 49 y.o. female    HPI        Review of Systems   Constitutional: Negative for activity change, appetite change, fatigue and fever.   HENT: Negative for sore throat.    Eyes: Negative for visual disturbance.   Respiratory: Negative for cough, chest tightness and shortness of breath.    Cardiovascular: Negative for chest pain, palpitations and leg swelling.   Gastrointestinal: Negative for abdominal pain, constipation and diarrhea.   Endocrine: Negative for cold intolerance.   Genitourinary: Negative for dysuria and urgency.   Musculoskeletal: Negative for back pain.   Neurological: Negative for dizziness, syncope and headaches.   Hematological: Does not bruise/bleed easily.   Psychiatric/Behavioral: Negative for confusion and sleep disturbance. The patient is not nervous/anxious.            The 10-year ASCVD risk score Mikey Bussing DC Brooke Bonito., et al., 2013) is: 5.9%    Values used to calculate the score:      Age: 78 years      Sex: Female      Is Non-Hispanic African American: No      Diabetic: Yes      Tobacco smoker: No      Systolic Blood Pressure: XX123456 mmHg      Is BP treated: Yes      HDL Cholesterol: 37 mg/dL      Total Cholesterol: 219 mg/dL      Objective:     Physical Exam  Constitutional:       Appearance: Normal appearance.   HENT:      Head: Normocephalic.      Right Ear: External ear normal.      Left Ear: External ear normal.      Nose: Nose normal.      Mouth/Throat:      Mouth: Mucous membranes are moist.      Pharynx: Oropharynx is clear.   Eyes:      Conjunctiva/sclera: Conjunctivae normal.   Neck:      Musculoskeletal: Neck supple.   Cardiovascular:      Rate and Rhythm: Normal rate and regular rhythm.      Pulses: Normal pulses.      Heart sounds: Normal heart sounds. No murmur.   Pulmonary:      Effort: Pulmonary effort is normal.      Breath sounds: Normal breath sounds.   Musculoskeletal: Normal range of motion.      Right lower leg: No edema.      Left  lower leg: No edema.   Lymphadenopathy:      Cervical: No cervical adenopathy.   Skin:     General: Skin is warm and dry.      Capillary Refill: Capillary refill takes less than 2 seconds.   Neurological:      General: No focal deficit present.      Mental Status: She is alert and oriented to person, place, and time.   Psychiatric:         Mood and Affect: Mood normal.         Behavior: Behavior normal.           Assessment/Plan:     1. Essential hypertension    2. Mixed hyperlipidemia    3. Uncontrolled type 2 diabetes mellitus with hyperglycemia ([place])    4. BMI 40.0-44.9, adult ([facility])            Chaisty was seen  today for hypertension, hyperlipidemia, fatigue and other.    Diagnoses and all orders for this visit:    Essential hypertension  Continue same meds for now.  Avoid stimulants salt and drink extra water  Mixed hyperlipidemia  Recommend that she stops the over-the-counter cholesterol treatment as well as fenofibrate.  Believe she should be on statin treatment and will start Crestor 10 mg.  Patient instructed to take it every other day  Uncontrolled type 2 diabetes mellitus with hyperglycemia ([place])  Labs were reviewed with her at length her hemoglobin A1c is 7.9.  She is increasing her Metformin to 1000 mg twice a day.  Limiting factor is GI upset and diarrhea.  If she cannot tolerate this we will consider alternative treatments that may help with weight loss including Trulicity or Ozempic  BMI 40.0-44.9, adult ([place])  Her biggest risk factor is her weight really need to work with her to help to get her weight down we made some suggestions at length her may need the help of the health coach  Other orders  -     rosuvastatin (CRESTOR) 10 MG tablet; Take 1 tablet by mouth daily      WEIGHT LOSS    Recommend 10 % weight loss over 3 months  Low glycemic index diet  Avoid naked carbohydrates, e.g. peanut butter added to small banana  Avoid carbohydrate to fiber ratio more than 10:1  Whole oatmeal at least every  other day at breakfast  May have 6 eggs per week  Focus on 25 gms of protein at breakfast  Avoid carbos first thing in morning to limit glucose surge  Limit animal protein  Limit dairy  Spinach salad daily  Try for 25-50 grams of fiber per day    May need to consider Ozempic or Trulicity    Neta Mends, MD    Please note that this chart was generated using voice recognition Dragon dictation software.  Although every effort was made to ensure the accuracy of this automated transcription, some errors in transcription may have occurred.

## 2019-05-29 ENCOUNTER — Inpatient Hospital Stay: Payer: BLUE CROSS/BLUE SHIELD

## 2019-05-29 LAB — POC GLUCOSE FINGERSTICK: POC Glucose: 178 mg/dL — ABNORMAL HIGH (ref 65–105)

## 2019-05-29 MED ORDER — LACTATED RINGERS IV SOLN
INTRAVENOUS | Status: DC
Start: 2019-05-29 — End: 2019-05-29

## 2019-05-29 MED ORDER — ONDANSETRON HCL 4 MG/2ML IJ SOLN
4 | INTRAMUSCULAR | Status: AC
Start: 2019-05-29 — End: 2019-05-29

## 2019-05-29 MED ORDER — HYDROCODONE-ACETAMINOPHEN 5-325 MG PO TABS
5-325 | ORAL | Status: DC | PRN
Start: 2019-05-29 — End: 2019-05-29

## 2019-05-29 MED ORDER — SODIUM CHLORIDE 0.9 % IV SOLN
0.9 | INTRAVENOUS | Status: DC
Start: 2019-05-29 — End: 2019-05-29
  Administered 2019-05-29: 14:00:00 via INTRAVENOUS

## 2019-05-29 MED ORDER — MEPERIDINE HCL 50 MG/ML IJ SOLN
50 | INTRAMUSCULAR | Status: DC | PRN
Start: 2019-05-29 — End: 2019-05-29

## 2019-05-29 MED ORDER — ONDANSETRON HCL 4 MG/2ML IJ SOLN
4 MG/2ML | Freq: Once | INTRAMUSCULAR | Status: AC | PRN
Start: 2019-05-29 — End: 2019-05-29
  Administered 2019-05-29: 14:00:00 4 mg via INTRAVENOUS

## 2019-05-29 MED ORDER — DEXAMETHASONE SODIUM PHOSPHATE 10 MG/ML IJ SOLN
10 MG/ML | INTRAMUSCULAR | Status: DC | PRN
Start: 2019-05-29 — End: 2019-05-29
  Administered 2019-05-29: 14:00:00 10 via INTRAVENOUS

## 2019-05-29 MED ORDER — NORMAL SALINE FLUSH 0.9 % IV SOLN
0.9 | Freq: Two times a day (BID) | INTRAVENOUS | Status: DC
Start: 2019-05-29 — End: 2019-05-29

## 2019-05-29 MED ORDER — DEXAMETHASONE SOD PHOSPHATE PF 10 MG/ML IJ SOLN
10 | INTRAMUSCULAR | Status: AC
Start: 2019-05-29 — End: 2019-05-29

## 2019-05-29 MED ORDER — NORMAL SALINE FLUSH 0.9 % IV SOLN
0.9 | INTRAVENOUS | Status: DC | PRN
Start: 2019-05-29 — End: 2019-05-29

## 2019-05-29 MED ORDER — LIDOCAINE HCL (PF) 1 % IJ SOLN
1 % | INTRAMUSCULAR | Status: DC | PRN
Start: 2019-05-29 — End: 2019-05-29
  Administered 2019-05-29: 14:00:00 100 via INTRAVENOUS

## 2019-05-29 MED ORDER — HYDROMORPHONE HCL 1 MG/ML IJ SOLN
1 | INTRAMUSCULAR | Status: DC | PRN
Start: 2019-05-29 — End: 2019-05-29

## 2019-05-29 MED ORDER — DIPHENHYDRAMINE HCL 50 MG/ML IJ SOLN
50 MG/ML | Freq: Once | INTRAMUSCULAR | Status: DC | PRN
Start: 2019-05-29 — End: 2019-05-29

## 2019-05-29 MED ORDER — FENTANYL CITRATE (PF) 100 MCG/2ML IJ SOLN
100 MCG/2ML | INTRAMUSCULAR | Status: DC | PRN
Start: 2019-05-29 — End: 2019-05-29

## 2019-05-29 MED ORDER — PROPOFOL 200 MG/20ML IV EMUL
200 | INTRAVENOUS | Status: AC
Start: 2019-05-29 — End: 2019-05-29

## 2019-05-29 MED ORDER — MIDAZOLAM HCL 2 MG/2ML IJ SOLN
22 MG/ML | INTRAMUSCULAR | Status: DC | PRN
Start: 2019-05-29 — End: 2019-05-29
  Administered 2019-05-29: 14:00:00 2 via INTRAVENOUS

## 2019-05-29 MED ORDER — HYDRALAZINE HCL 20 MG/ML IJ SOLN
20 MG/ML | INTRAMUSCULAR | Status: DC | PRN
Start: 2019-05-29 — End: 2019-05-29

## 2019-05-29 MED ORDER — FENTANYL CITRATE (PF) 100 MCG/2ML IJ SOLN
100 | INTRAMUSCULAR | Status: AC
Start: 2019-05-29 — End: 2019-05-29

## 2019-05-29 MED ORDER — PROMETHAZINE HCL 25 MG/ML IJ SOLN
25 MG/ML | Freq: Once | INTRAMUSCULAR | Status: DC | PRN
Start: 2019-05-29 — End: 2019-05-29

## 2019-05-29 MED ORDER — MORPHINE SULFATE (PF) 1 MG/ML IJ SOLN
1 MG/ML | INTRAMUSCULAR | Status: DC | PRN
Start: 2019-05-29 — End: 2019-05-29

## 2019-05-29 MED ORDER — FENTANYL CITRATE (PF) 100 MCG/2ML IJ SOLN
100 MCG/2ML | INTRAMUSCULAR | Status: DC | PRN
Start: 2019-05-29 — End: 2019-05-29
  Administered 2019-05-29: 14:00:00 50 via INTRAVENOUS

## 2019-05-29 MED ORDER — LIDOCAINE HCL (PF) 1 % IJ SOLN
1 | INTRAMUSCULAR | Status: AC
Start: 2019-05-29 — End: 2019-05-29

## 2019-05-29 MED ORDER — PROPOFOL 200 MG/20ML IV EMUL
20020 MG/20ML | INTRAVENOUS | Status: DC | PRN
Start: 2019-05-29 — End: 2019-05-29
  Administered 2019-05-29: 14:00:00 200 via INTRAVENOUS

## 2019-05-29 MED ORDER — MIDAZOLAM HCL 2 MG/2ML IJ SOLN
2 | INTRAMUSCULAR | Status: AC
Start: 2019-05-29 — End: 2019-05-29

## 2019-05-29 MED FILL — DEXAMETHASONE SOD PHOSPHATE PF 10 MG/ML IJ SOLN: 10 mg/mL | INTRAMUSCULAR | Qty: 1

## 2019-05-29 MED FILL — FENTANYL CITRATE (PF) 100 MCG/2ML IJ SOLN: 100 MCG/2ML | INTRAMUSCULAR | Qty: 2

## 2019-05-29 MED FILL — ONDANSETRON HCL 4 MG/2ML IJ SOLN: 4 MG/2ML | INTRAMUSCULAR | Qty: 2

## 2019-05-29 MED FILL — XYLOCAINE-MPF 1 % IJ SOLN: 1 % | INTRAMUSCULAR | Qty: 5

## 2019-05-29 MED FILL — PROPOFOL 200 MG/20ML IV EMUL: 200 MG/20ML | INTRAVENOUS | Qty: 20

## 2019-05-29 MED FILL — MIDAZOLAM HCL 2 MG/2ML IJ SOLN: 2 mg/mL | INTRAMUSCULAR | Qty: 2

## 2019-05-29 NOTE — H&P (Addendum)
Procedure History and Physical    Pre-Procedural Diagnosis: Unexpected weight loss.  Fatigue.  Family history of colon cancer.    Indications:  same    Procedure Planned: colonoscopy     History Obtained From:  patient    HISTORY OF PRESENT ILLNESS:       The patient is a 49 y.o. female who presents for the above procedure.        Past Medical History:    Past Medical History:   Diagnosis Date   ??? Glucose intolerance    ??? History of vitamin D deficiency    ??? Hyperlipidemia    ??? Hypertension        Past Surgical History:    Past Surgical History:   Procedure Laterality Date   ??? BREAST SURGERY Right     benign cyst removed    ??? CHOLECYSTECTOMY     ??? HYSTERECTOMY, VAGINAL      pt has had 2 miscarriages, and a DNC also    ??? TONSILLECTOMY         Medications:  Current Facility-Administered Medications   Medication Dose Route Frequency Provider Last Rate Last Admin   ??? 0.9 % sodium chloride infusion   Intravenous Continuous Colette Ribas, MD       ??? lactated ringers infusion   Intravenous Continuous Colette Ribas, MD       ??? sodium chloride flush 0.9 % injection 10 mL  10 mL Intravenous 2 times per day Colette Ribas, MD       ??? sodium chloride flush 0.9 % injection 10 mL  10 mL Intravenous PRN Colette Ribas, MD           Allergies:   No Known Allergies            Problems with Sedation/Anesthesia in the past? No    Social history: Please refer to chart.    REVIEW OF SYSTEMS:  12 point review of systems negative other than mentioned above.      PHYSICAL EXAM:    Vitals:  BP (!) 154/80    Pulse 99    Temp 98 ??F (36.7 ??C)    Resp 16    Ht 5\' 3"  (1.6 m)    Wt 227 lb (103 kg)    SpO2 100%    BMI 40.21 kg/m??     Focused Exam related to procedure:    General appearance: NAD, conversant   Eyes: anicteric sclerae, moist conjunctivae; no lid-lag; PERRLA   Lungs: CTA, with normal respiratory effort and no intercostal retractions   CV: RRR, no MRGs   Abdomen: Soft, non-tender; no masses or HSM   Skin: Normal temperature, turgor and texture; no rash, ulcers  or subcutaneous nodules     DATA:  CBC:   Lab Results   Component Value Date    WBC 10.2 05/02/2019    HGB 13.8 05/02/2019    HCT 43.7 05/02/2019    MCV 96.3 05/02/2019    PLT 330 05/02/2019     BUN/Cr:   Lab Results   Component Value Date    BUN 11 05/02/2019   ,   Lab Results   Component Value Date    CREATININE 0.55 05/02/2019     Potassium:   Lab Results   Component Value Date    K 4.0 05/02/2019     PT/INR: No results found for: INR, PROTIME    ASSESSMENT AND PLAN:     Weight loss.  Fatigue.  Family  history of colon cancer for colonoscopy.    1.  Patient is a 49 y.o. female with above specified procedure planned.  Expected Sedation/Anesthesia Type: General    2.  ASA ([facility]) Anesthesia Status: Class 2 - A normal healthy patient with mild systemic disease    3. ASA Physical Status Classification: ASA 2 - Patient with mild systemic disease with no functional limitations    4.  Procedure options, risks and benefits reviewed with Patient.  Patient expresses understanding.    5.  Consent has been signed:  Yes    Itati Brocksmith Jomarie Longs

## 2019-05-29 NOTE — Op Note (Signed)
Operative Note      Patient: Catherine Forbes  Date of Birth: 04-18-1971  MRN: [id number]    Date of Procedure: 05/29/2019    PROCEDURE NOTE    DATE OF PROCEDURE: 05/29/2019    SURGEON: Dennison Bulla, MD    ASSISTANT: None    PREOPERATIVE DIAGNOSIS: Weight loss.  Fatigue.  Strong family history of colon cancer in patient's mom    POSTOPERATIVE DIAGNOSIS: Transverse colon polyp.  Ascending colon polyp.  Fair preparation.    OPERATION: Total colonoscopy to cecum with multiple polypectomies with large cold biopsy forceps.    ANESTHESIA: General    ESTIMATED BLOOD LOSS: None    COMPLICATIONS: None     SPECIMENS:  Was Obtained: Transverse colon polyp.  Ascending colon polyp.    HISTORY: The patient is a 49 y.o. year old female with history of above preop diagnosis.  I recommended colonoscopy with possible biopsy or polypectomy and I explained the risk, benefits, expected outcome, and alternatives to the procedure.  Risks included but are not limited to bleeding, infection, respiratory distress, hypotension, and perforation of the colon and possibility of missing a lesion.  The patient understands and is in agreement.      PROCEDURE: The patient was given IV conscious sedation.  The patient's SPO2 remained above 90% throughout the procedure. Digital rectal exam was normal.  The colonoscope was inserted through the anus into the rectum and advanced under direct vision to the cecum without difficulty.  Terminal ileum was examined for approximately 2 inches.  The prep was fair.      Findings:    Cecum/Ascending colon: abnormal: Ascending colon polyp removed with large cold biopsy forceps.    Transverse colon: abnormal: Transverse colon polyp removed with large cold biopsy forceps.    Descending/Sigmoid colon: normal    Rectum/Anus: examined in normal and retroflexed positions and was normal    Withdrawal Time was (minutes): 16      Next screening colonoscopy: 3 years.  If screening is less than 10 years the recommended reason is  PIR:JJOA preparation.  Evidence of colon polyp.  Family history of colon cancer.    The colon was decompressed.  While withdrawing the scope the above findings were verified and the scope was removed.  The patient tolerated the procedure and conscious sedation without unusual events.    In the recovery room patient was examined and remains hemodynamically stable.  Discharge home when criteria met.    Recommendations/Plan:   1. F/U Biopsies  2. F/U In Office as instructed  3. Discussed with the family  4. High fiber diet   5. Precautions to avoid constipation    Electronically signed by Dennison Bulla, MD  on 05/29/2019 at 9:30 AM

## 2019-05-29 NOTE — Discharge Instructions (Signed)
Normal changes you may experience after a colonoscopy:   Passing of gas for several hours after   Some mild abdominal cramping   If a biopsy/ polypectomy was done, you may see some spotting of blood on the tissue when wiping   You may feel fatigued for the next 24-48 hours due to the preparation, sedation and procedure    Activity   . You have had anesthesia today  . Do not drive, operate heavy equipment, consume alcoholic beverages, or make any important decisions  for 24 hours   . Take your time changing positions today. You may feel light headed or dizzy if you move too quickly.   [name] Rest for the next 24 hours.   Diet   You can eat your normal diet when you feel well. You should start off with bland foods like chicken soup, toast, or yogurt. Then advance as tolerated.  . Drink plenty of fluids (unless your doctor tells you not to). Your urine should be very lightly colored without a strong odor.    Medicines   . Continue your home medications as ordered by your physician.     Call your doctor now or seek immediate medical care if:   [phone]  [facility])   You are passing blood rectally or vomiting blood (color of blood may be red or black)   Severe abdominal pain or tenderness (that is not relieved by passing air)  .  You have a fever, chills or excessive sweating  .  You have persistent nausea or vomiting  .  Redness or swelling at the IV site

## 2019-05-30 LAB — SURGICAL PATHOLOGY

## 2019-07-14 ENCOUNTER — Ambulatory Visit
Admit: 2019-07-14 | Discharge: 2019-07-14 | Payer: BLUE CROSS/BLUE SHIELD | Attending: Family Medicine | Primary: Family Medicine

## 2019-07-14 ENCOUNTER — Inpatient Hospital Stay: Payer: BLUE CROSS/BLUE SHIELD | Primary: Family Medicine

## 2019-07-14 DIAGNOSIS — R21 Rash and other nonspecific skin eruption: Secondary | ICD-10-CM

## 2019-07-14 DIAGNOSIS — E1165 Type 2 diabetes mellitus with hyperglycemia: Secondary | ICD-10-CM

## 2019-07-14 LAB — CBC WITH AUTO DIFFERENTIAL
Absolute Eos #: 0.35 10*3/uL (ref 0.00–0.44)
Absolute Immature Granulocyte: 0.07 10*3/uL (ref 0.00–0.30)
Absolute Lymph #: 2.02 10*3/uL (ref 1.10–3.70)
Absolute Mono #: 0.79 10*3/uL (ref 0.10–1.20)
Basophils Absolute: 0.11 10*3/uL (ref 0.00–0.20)
Basophils: 1 % (ref 0–2)
Eosinophils %: 3 % (ref 1–4)
Hematocrit: 43.2 % (ref 36.3–47.1)
Hemoglobin: 13.8 g/dL (ref 11.9–15.1)
Immature Granulocytes: 1 % — ABNORMAL HIGH
Lymphocytes: 18 % — ABNORMAL LOW (ref 24–43)
MCH: 30.9 pg (ref 25.2–33.5)
MCHC: 31.9 g/dL (ref 28.4–34.8)
MCV: 96.6 fL (ref 82.6–102.9)
MPV: 9.7 fL (ref 8.1–13.5)
Monocytes: 7 % (ref 3–12)
NRBC Automated: 0 per 100 WBC
Platelets: 323 10*3/uL (ref 138–453)
RBC: 4.47 m/uL (ref 3.95–5.11)
RDW: 13 % (ref 11.8–14.4)
Seg Neutrophils: 70 % — ABNORMAL HIGH (ref 36–65)
Segs Absolute: 7.98 10*3/uL (ref 1.50–8.10)
WBC: 11.3 10*3/uL (ref 3.5–11.3)

## 2019-07-14 LAB — RHEUMATOID FACTOR: Rheumatoid Factor: <10 IU/mL (ref <14)

## 2019-07-14 LAB — POCT GLYCOSYLATED HEMOGLOBIN (HGB A1C): Hemoglobin A1C: 7.8 %

## 2019-07-14 LAB — SEDIMENTATION RATE: Sed Rate: 6 mm (ref 0–20)

## 2019-07-14 MED ORDER — TRAZODONE HCL 50 MG PO TABS
50 MG | ORAL_TABLET | Freq: Every evening | ORAL | 5 refills | Status: DC | PRN
Start: 2019-07-14 — End: 2019-11-03

## 2019-07-14 MED ORDER — TRIAMCINOLONE ACETONIDE 0.025 % EX OINT
0.025 % | CUTANEOUS | 3 refills | Status: AC
Start: 2019-07-14 — End: 2019-07-21

## 2019-07-15 NOTE — Telephone Encounter (Signed)
Patient is calling in regards to her results. Please call her. Thank you

## 2019-07-16 NOTE — Telephone Encounter (Signed)
Will discuss with provider and call patient back

## 2019-07-18 NOTE — Telephone Encounter (Signed)
Pt Catherine Forbes January 01, 1971 called in regards to test results mentioned she was waiting on a return call and would like a call back because stated there was some concerns on her labs?

## 2019-07-21 NOTE — Telephone Encounter (Signed)
Called pt to let her know labs are normal and not be concerned with the slight "abnormals " that are indicated - reviewed with the provider

## 2019-08-11 ENCOUNTER — Ambulatory Visit
Admit: 2019-08-11 | Discharge: 2019-08-11 | Payer: BLUE CROSS/BLUE SHIELD | Attending: Family Medicine | Primary: Family Medicine

## 2019-08-11 DIAGNOSIS — E1165 Type 2 diabetes mellitus with hyperglycemia: Secondary | ICD-10-CM

## 2019-08-11 MED ORDER — OZEMPIC (0.25 OR 0.5 MG/DOSE) 2 MG/1.5ML SC SOPN
2 | PEN_INJECTOR | SUBCUTANEOUS | 3 refills | Status: DC
Start: 2019-08-11 — End: 2020-02-17

## 2019-08-11 MED ORDER — OZEMPIC (0.25 OR 0.5 MG/DOSE) 2 MG/1.5ML SC SOPN
2 | PEN_INJECTOR | SUBCUTANEOUS | 0 refills | Status: DC
Start: 2019-08-11 — End: 2020-02-17

## 2019-08-11 MED ORDER — TRIAMCINOLONE ACETONIDE 0.1 % EX CREA
0.1 | CUTANEOUS | 2 refills | Status: DC
Start: 2019-08-11 — End: 2019-10-06

## 2019-09-02 MED ORDER — ROSUVASTATIN CALCIUM 10 MG PO TABS
10 MG | ORAL_TABLET | ORAL | 1 refills | Status: DC
Start: 2019-09-02 — End: 2020-03-17

## 2019-09-04 ENCOUNTER — Encounter

## 2019-09-08 ENCOUNTER — Ambulatory Visit
Admit: 2019-09-08 | Discharge: 2019-09-08 | Payer: BLUE CROSS/BLUE SHIELD | Attending: Family Medicine | Primary: Family Medicine

## 2019-09-08 ENCOUNTER — Inpatient Hospital Stay: Payer: BLUE CROSS/BLUE SHIELD | Primary: Family Medicine

## 2019-09-08 DIAGNOSIS — E7439 Other disorders of intestinal carbohydrate absorption: Secondary | ICD-10-CM

## 2019-09-08 DIAGNOSIS — R5383 Other fatigue: Secondary | ICD-10-CM

## 2019-09-08 LAB — PROGESTERONE: Progesterone: 0.12 ng/mL

## 2019-09-08 LAB — LUTEINIZING HORMONE: LH: 27.4 U/L (ref 1.0–95.6)

## 2019-09-08 LAB — ESTRADIOL: Estradiol: 37 pg/mL (ref 27–314)

## 2019-09-08 LAB — FOLLICLE STIMULATING HORMONE: FSH: 28.4 U/L — ABNORMAL HIGH (ref 1.7–21.5)

## 2019-09-09 LAB — TESTOSTERONE, FREE
Sex Hormone Binding: 25 nmol/L — ABNORMAL LOW (ref 30–135)
Testosterone, Free: 4.8 pg/mL (ref 1.1–5.8)
Testosterone: 23 ng/dL (ref 20–70)

## 2019-09-09 LAB — DHEA-SULFATE: DHEAS (DHEA Sulfate): 153 ug/dL (ref 32–240)

## 2019-09-09 LAB — T3, FREE: T3, Free: 3.58 pg/mL (ref 2.02–4.43)

## 2019-09-16 NOTE — Telephone Encounter (Signed)
Recommend follow-up visit to discuss his labs

## 2019-09-17 ENCOUNTER — Ambulatory Visit
Admit: 2019-09-17 | Discharge: 2019-09-17 | Payer: BLUE CROSS/BLUE SHIELD | Attending: Family Medicine | Primary: Family Medicine

## 2019-09-17 DIAGNOSIS — N951 Menopausal and female climacteric states: Secondary | ICD-10-CM

## 2019-09-17 MED ORDER — PROGESTERONE 100 MG PO CAPS
100 MG | ORAL_CAPSULE | Freq: Every evening | ORAL | 2 refills | Status: DC
Start: 2019-09-17 — End: 2020-02-16

## 2019-09-17 MED ORDER — ESTRADIOL 0.025 MG/24HR TD PTWK
0.02524 MG/24HR | MEDICATED_PATCH | TRANSDERMAL | 2 refills | Status: DC
Start: 2019-09-17 — End: 2019-11-24

## 2019-10-06 MED ORDER — SPIRONOLACTONE-HCTZ 25-25 MG PO TABS
25-25 MG | ORAL_TABLET | ORAL | 1 refills | Status: DC
Start: 2019-10-06 — End: 2020-03-29

## 2019-10-06 MED ORDER — TRIAMCINOLONE ACETONIDE 0.1 % EX CREA
0.1 % | CUTANEOUS | 2 refills | Status: DC
Start: 2019-10-06 — End: 2021-02-09

## 2019-10-06 NOTE — Telephone Encounter (Signed)
Sent refill of cream to pharmacy

## 2019-11-03 MED ORDER — TRAZODONE HCL 50 MG PO TABS
50 MG | ORAL_TABLET | ORAL | 1 refills | Status: DC
Start: 2019-11-03 — End: 2020-02-17

## 2019-11-06 ENCOUNTER — Encounter: Attending: Family Medicine | Primary: Family Medicine

## 2019-11-06 ENCOUNTER — Inpatient Hospital Stay: Payer: BLUE CROSS/BLUE SHIELD | Primary: Family Medicine

## 2019-11-06 DIAGNOSIS — N951 Menopausal and female climacteric states: Secondary | ICD-10-CM

## 2019-11-06 LAB — ESTRADIOL: Estradiol: 101 pg/mL (ref 27–314)

## 2019-11-06 LAB — PROGESTERONE: Progesterone: 0.18 ng/mL

## 2019-11-06 LAB — VITAMIN D 25 HYDROXY: Vit D, 25-Hydroxy: 45.3 ng/mL (ref 30.0–100.0)

## 2019-11-06 MED ORDER — METFORMIN HCL ER (MOD) 500 MG PO TB24
500 MG | ORAL_TABLET | ORAL | 3 refills | Status: DC
Start: 2019-11-06 — End: 2019-11-06

## 2019-11-06 MED ORDER — METFORMIN HCL ER (MOD) 500 MG PO TB24
500 MG | ORAL_TABLET | ORAL | 3 refills | Status: DC
Start: 2019-11-06 — End: 2019-11-10

## 2019-11-07 LAB — THYROID ANTIBODIES
Thyroglobulin Ab: <12 IU/mL (ref 0.0–40.0)
Thyroid Peroxidase (Tpo) Ab: <4 IU/mL (ref 0.0–25.0)

## 2019-11-10 MED ORDER — METFORMIN HCL ER 500 MG PO TB24
500 MG | ORAL_TABLET | ORAL | 2 refills | Status: DC
Start: 2019-11-10 — End: 2019-11-11

## 2019-11-10 MED ORDER — METFORMIN HCL ER 500 MG PO TB24
500 MG | ORAL_TABLET | ORAL | 2 refills | Status: DC
Start: 2019-11-10 — End: 2019-11-10

## 2019-11-10 NOTE — Telephone Encounter (Signed)
metformin oral sent

## 2019-11-11 MED ORDER — METFORMIN HCL ER 500 MG PO TB24
500 MG | ORAL_TABLET | ORAL | 2 refills | Status: DC
Start: 2019-11-11 — End: 2019-11-17

## 2019-11-17 ENCOUNTER — Ambulatory Visit
Admit: 2019-11-17 | Discharge: 2019-11-17 | Payer: BLUE CROSS/BLUE SHIELD | Attending: Family Medicine | Primary: Family Medicine

## 2019-11-17 DIAGNOSIS — E7439 Other disorders of intestinal carbohydrate absorption: Secondary | ICD-10-CM

## 2019-11-17 LAB — POCT GLYCOSYLATED HEMOGLOBIN (HGB A1C): Hemoglobin A1C: 6.5 %

## 2019-11-17 MED ORDER — METFORMIN HCL ER 500 MG PO TB24
500 MG | ORAL_TABLET | ORAL | 2 refills | Status: DC
Start: 2019-11-17 — End: 2021-02-09

## 2019-11-17 MED ORDER — METFORMIN HCL ER 500 MG PO TB24
500 MG | ORAL_TABLET | ORAL | 2 refills | Status: DC
Start: 2019-11-17 — End: 2019-11-17

## 2019-11-24 MED ORDER — ESTRADIOL 0.025 MG/24HR TD PTTW
0.025 MG/24HR | MEDICATED_PATCH | TRANSDERMAL | 2 refills | Status: DC
Start: 2019-11-24 — End: 2020-02-16

## 2019-11-24 MED ORDER — ESTRADIOL 0.025 MG/24HR TD PTWK
0.02524 MG/24HR | MEDICATED_PATCH | TRANSDERMAL | 2 refills | Status: DC
Start: 2019-11-24 — End: 2020-02-17

## 2019-11-24 NOTE — Telephone Encounter (Signed)
From: [name]  To: Neta Mends, MD  Sent: 11/24/2019 10:59 AM EDT  Subject: Prescription Question    Hi Dr Naaman Plummer and Joellen Jersey,    I called [facility] and they said they had not received my estrogen patch or progesterone prescription from your office yet. I think Joellen Jersey put that in Monday while I was there. I've been having issues with them lately, but could you check on your end and see what it's saying?     Thank you and hope y'all have a wonderful day!    [name] :)

## 2019-11-24 NOTE — Telephone Encounter (Signed)
[facility] called in regards to pt stating she is still waiting on meds and Joellen Jersey is aware and will take care of it.

## 2019-11-24 NOTE — Telephone Encounter (Signed)
Sent hormone medications for pt.

## 2019-11-26 NOTE — Telephone Encounter (Signed)
They are wanting me to call them and I will do this today and it will be taken care of before 5 pm.

## 2019-12-01 NOTE — Telephone Encounter (Signed)
Called to [facility] to verify pt. is to use estradiol patch/climara patch, 1 patch twice weekly

## 2020-01-01 ENCOUNTER — Encounter

## 2020-01-01 MED ORDER — OZEMPIC (0.25 OR 0.5 MG/DOSE) 2 MG/1.5ML SC SOPN
21.5 MG/1.5ML | PEN_INJECTOR | SUBCUTANEOUS | 3 refills | Status: DC
Start: 2020-01-01 — End: 2020-08-31

## 2020-01-01 MED ORDER — LISINOPRIL 10 MG PO TABS
10 MG | ORAL_TABLET | Freq: Every day | ORAL | 1 refills | Status: DC
Start: 2020-01-01 — End: 2020-06-28

## 2020-01-01 NOTE — Telephone Encounter (Signed)
Per verbal order  Sent ozempic and lisinopril script

## 2020-02-16 MED ORDER — ESTRADIOL 0.025 MG/24HR TD PTTW
0.02524 MG/24HR | MEDICATED_PATCH | TRANSDERMAL | 2 refills | Status: DC
Start: 2020-02-16 — End: 2020-02-17

## 2020-02-16 MED ORDER — PROGESTERONE 100 MG PO CAPS
100 MG | ORAL_CAPSULE | ORAL | 2 refills | Status: DC
Start: 2020-02-16 — End: 2021-02-09

## 2020-02-16 NOTE — Telephone Encounter (Signed)
Per verbal order

## 2020-02-17 ENCOUNTER — Ambulatory Visit
Admit: 2020-02-17 | Discharge: 2020-02-17 | Payer: BLUE CROSS/BLUE SHIELD | Attending: Family Medicine | Primary: Family Medicine

## 2020-02-17 DIAGNOSIS — E119 Type 2 diabetes mellitus without complications: Secondary | ICD-10-CM

## 2020-02-17 LAB — POCT GLYCOSYLATED HEMOGLOBIN (HGB A1C): Hemoglobin A1C: 6.5 %

## 2020-02-17 MED ORDER — OZEMPIC (1 MG/DOSE) 2 MG/1.5ML SC SOPN
21.5 MG/1.5ML | SUBCUTANEOUS | 3 refills | Status: DC
Start: 2020-02-17 — End: 2020-06-14

## 2020-02-17 MED ORDER — ESTRADIOL 0.025 MG/24HR TD PTWK
0.02524 MG/24HR | MEDICATED_PATCH | TRANSDERMAL | 2 refills | Status: DC
Start: 2020-02-17 — End: 2020-05-10

## 2020-02-17 MED ORDER — CLOTRIMAZOLE-BETAMETHASONE 1-0.05 % EX CREA
1-0.05 % | CUTANEOUS | 0 refills | Status: DC
Start: 2020-02-17 — End: 2021-02-09

## 2020-02-17 NOTE — Progress Notes (Signed)
Subjective:     Patient ID: Catherine Forbes is a 49 y.o. female    HPI   Patient returns today for a follow-up on her diabetes or hypertension and her insomnia.  She thinks she is doing pretty well with her diabetes is tolerating the therapy pretty well.  She also feels pretty well on her hormone replacement therapy however she still is fatigued most of the day.  Would start her on some trazodone at night but she somehow can use this with the CPAP that she is on for her obstructive sleep apnea.  She feels her hypertension has been under good control with the current meds as well.  In addition she found a skin lesion on her left breast that she is concerned about and would like to have checked today      Review of Systems   Constitutional: Negative for fever.   HENT: Negative for congestion, ear pain and sore throat.    Respiratory: Negative for shortness of breath and wheezing.    Cardiovascular: Negative for chest pain and leg swelling.   Gastrointestinal: Negative for abdominal pain.   Genitourinary: Negative for dysuria and vaginal bleeding.   Skin: Positive for color change and rash.   Neurological: Negative for syncope.   Hematological: Negative for adenopathy.   Psychiatric/Behavioral: Positive for sleep disturbance. The patient is not nervous/anxious.              Objective:     Physical Exam  Constitutional:       Appearance: Normal appearance.   HENT:      Head: Normocephalic.      Right Ear: External ear normal.      Left Ear: External ear normal.      Nose: Nose normal.      Mouth/Throat:      Mouth: Mucous membranes are moist.      Pharynx: Oropharynx is clear.   Eyes:      Conjunctiva/sclera: Conjunctivae normal.   Cardiovascular:      Rate and Rhythm: Normal rate and regular rhythm.      Pulses: Normal pulses.      Heart sounds: Normal heart sounds. No murmur heard.     Pulmonary:      Effort: Pulmonary effort is normal.      Breath sounds: Normal breath sounds.   Chest:      Breasts:         Right:  Normal.         Left: Normal.          Comments: On breast exam she has a pigmented raised scaly lesion that appears to be superficial on her left breast in the inner upper quadrant.  This lesion seems to be freely movable and not attached to the tissue subdermally.  Breast exam is done while at work here and she does have some fibrocystic disease in the inner lower quadrant of each breast no lymphadenopathy appreciated  Musculoskeletal:         General: Normal range of motion.      Cervical back: Neck supple.      Right lower leg: No edema.      Left lower leg: No edema.   Lymphadenopathy:      Cervical: No cervical adenopathy.      Upper Body:      Right upper body: No supraclavicular, axillary or pectoral adenopathy.      Left upper body: No supraclavicular, axillary or pectoral adenopathy.  Skin:     General: Skin is warm and dry.      Capillary Refill: Capillary refill takes less than 2 seconds.   Neurological:      General: No focal deficit present.      Mental Status: She is alert and oriented to person, place, and time.   Psychiatric:         Mood and Affect: Mood normal.         Behavior: Behavior normal.     Hemoglobin A1c was 6.5 same as in July  She is down 5 pounds from March    Assessment/Plan:     1. Type 2 diabetes mellitus without complication, without long-term current use of insulin ([place])    2. Obstructive sleep apnea    3. Skin lesion of breast    4. Encounter for screening mammogram for malignant neoplasm of breast    5. Essential hypertension    6. Hormone replacement therapy (HRT)            Catherine Forbes was seen today for other, diabetes, hypertension, insomnia and breast mass.    Diagnoses and all orders for this visit:    Type 2 diabetes mellitus without complication, without long-term current use of insulin ([place])  Doing well with her current medications and tolerating the Ozempic well.  Consider increasing dose to help with weight loss  Obstructive sleep apnea  Continue her CPAP needs some  adjustment  Skin lesion of breast  Most probably this is fungal Derm.  We will treat with Lotrisone.  But will check mammogram to look for underlying lesion  Encounter for screening mammogram for malignant neoplasm of breast  -     MAM Screening Bilateral; Future    Essential hypertension  Stable on lisinopril and Aldactazide  Hormone replacement therapy (HRT)  Tolerating estradiol and Prometrium as prescribed  Other orders  -     POCT glycosylated hemoglobin (Hb A1C)  -     clotrimazole-betamethasone (LOTRISONE) 1-0.05 % cream; Apply topically 2 times daily.  -     Semaglutide, 1 MG/DOSE, (OZEMPIC, 1 MG/DOSE,) 2 MG/1.5ML SOPN; Inject 1 mg into the skin once a week      We will call her with results of the mammogram.  And she is to let us know of the Lotrisone affects the skin lesion on her breast.  If there is any question we will do an excisional biopsy.        Catherine Mends, MD    Please note that this chart was generated using voice recognition Dragon dictation software.  Although every effort was made to ensure the accuracy of this automated transcription, some errors in transcription may have occurred.

## 2020-02-23 ENCOUNTER — Ambulatory Visit: Payer: BLUE CROSS/BLUE SHIELD | Primary: Family Medicine

## 2020-02-26 ENCOUNTER — Encounter

## 2020-02-26 ENCOUNTER — Inpatient Hospital Stay: Admit: 2020-02-26 | Payer: BLUE CROSS/BLUE SHIELD | Primary: Family Medicine

## 2020-02-26 DIAGNOSIS — Z1231 Encounter for screening mammogram for malignant neoplasm of breast: Secondary | ICD-10-CM

## 2020-03-08 ENCOUNTER — Ambulatory Visit
Admit: 2020-03-08 | Discharge: 2020-03-08 | Payer: BLUE CROSS/BLUE SHIELD | Attending: Family Medicine | Primary: Family Medicine

## 2020-03-08 ENCOUNTER — Inpatient Hospital Stay: Payer: BLUE CROSS/BLUE SHIELD | Primary: Family Medicine

## 2020-03-08 DIAGNOSIS — R197 Diarrhea, unspecified: Secondary | ICD-10-CM

## 2020-03-08 NOTE — Patient Instructions (Signed)
Decrease metformin to 500 mg twice daily

## 2020-03-08 NOTE — Progress Notes (Signed)
Subjective:     Patient ID: Catherine Forbes is a 49 y.o. female    HPI  Catherine Forbes returns today for recheck of the skin lesion of the breast.  She had a mammogram which was done which was totally normal.  She believes the skin lesion has decreased in size and irritation with the cream that was prescribed.  Her sugars have been under pretty good control  She does complain of persistent diarrhea most of the time.  Sometimes it follows a fatty meal but not always.  There is no dairy intolerance.  Sometimes the stool is yellow and almost bile colored.  She has no cramping or pain associated with this.  Denies any history to suggest intestinal parasite or infection      Review of Systems   Constitutional: Negative for fever.   HENT: Negative for congestion, ear pain and sore throat.    Respiratory: Negative for shortness of breath and wheezing.    Cardiovascular: Negative for chest pain and leg swelling.   Gastrointestinal: Positive for diarrhea. Negative for abdominal pain, blood in stool, nausea, rectal pain and vomiting.   Genitourinary: Negative for dysuria.   Skin: Negative for rash.   Neurological: Negative for syncope.   Hematological: Negative for adenopathy.             Objective:     Physical Exam  Constitutional:       Appearance: Normal appearance.   HENT:      Head: Normocephalic.      Right Ear: External ear normal.      Left Ear: External ear normal.      Nose: Nose normal.      Mouth/Throat:      Mouth: Mucous membranes are moist.      Pharynx: Oropharynx is clear.   Eyes:      General: No scleral icterus.     Conjunctiva/sclera: Conjunctivae normal.   Cardiovascular:      Rate and Rhythm: Normal rate and regular rhythm.      Pulses: Normal pulses.      Heart sounds: Normal heart sounds. No murmur heard.      Pulmonary:      Effort: Pulmonary effort is normal.      Breath sounds: Normal breath sounds.   Abdominal:      General: Bowel sounds are normal. There is no distension.      Palpations: Abdomen is  soft. There is no mass.      Tenderness: There is no abdominal tenderness. There is no guarding.   Musculoskeletal:         General: Normal range of motion.      Cervical back: Neck supple.      Right lower leg: No edema.      Left lower leg: No edema.   Lymphadenopathy:      Cervical: No cervical adenopathy.   Skin:     General: Skin is warm and dry.      Capillary Refill: Capillary refill takes less than 2 seconds.      Comments: Skin lesion of left breast much less irritated and his stated still some skin discoloration recommend that she continue the cream and for the lesion is fully gone for 3 days   Neurological:      General: No focal deficit present.      Mental Status: She is alert and oriented to person, place, and time.   Psychiatric:  Mood and Affect: Mood normal.         Behavior: Behavior normal.         Hemoglobin A1C   Date Value Ref Range Status   02/17/2020 6.5 % Final     T3, Free   Date Value Ref Range Status   09/08/2019 3.58 2.02 - 4.43 pg/mL Final         Assessment/Plan:     1. Diarrhea, unspecified type    2. Skin lesion of breast    3. Glucose intolerance            Seleen was seen today for results.    Diagnoses and all orders for this visit:    Diarrhea, unspecified type  -     CBC Auto Differential; Future  -     Sedimentation Rate; Future  -     Lipase; Future  -     Comprehensive Metabolic Panel; Future  She is status post cholecystectomy in 2017.  Question whether or not she has got some biliary tract disease.  We will check her labs today and also decrease her Metformin to 500 mg twice a day  Skin lesion of breast  Continue Lotrisone cream    Glucose intolerance  Sugars have been under pretty good control last A1c was good.  We will have to watch sugars closely as he decreased her Metformin    Patient was instructed to let us know how she is she is doing over the next 2 weeks.  If she is not substantially better we will get a CT scan of her abdomen and pelvis.  Patient  verbalized understanding          Neta Mends, MD    Please note that this chart was generated using voice recognition Dragon dictation software.  Although every effort was made to ensure the accuracy of this automated transcription, some errors in transcription may have occurred.

## 2020-03-09 LAB — CBC WITH AUTO DIFFERENTIAL
Absolute Eos #: 0.44 10*3/uL (ref 0.00–0.44)
Absolute Immature Granulocyte: 0.05 10*3/uL (ref 0.00–0.30)
Absolute Lymph #: 2.86 10*3/uL (ref 1.10–3.70)
Absolute Mono #: 1.04 10*3/uL (ref 0.10–1.20)
Basophils Absolute: 0.1 10*3/uL (ref 0.00–0.20)
Basophils: 1 % (ref 0–2)
Eosinophils %: 3 % (ref 1–4)
Hematocrit: 42.7 % (ref 36.3–47.1)
Hemoglobin: 14.2 g/dL (ref 11.9–15.1)
Immature Granulocytes: 0 %
Lymphocytes: 19 % — ABNORMAL LOW (ref 24–43)
MCH: 31.8 pg (ref 25.2–33.5)
MCHC: 33.3 g/dL (ref 28.4–34.8)
MCV: 95.5 fL (ref 82.6–102.9)
MPV: 9.8 fL (ref 8.1–13.5)
Monocytes: 7 % (ref 3–12)
NRBC Automated: 0 per 100 WBC
Platelets: 379 10*3/uL (ref 138–453)
RBC: 4.47 m/uL (ref 3.95–5.11)
RDW: 12.1 % (ref 11.8–14.4)
Seg Neutrophils: 70 % — ABNORMAL HIGH (ref 36–65)
Segs Absolute: 10.38 10*3/uL — ABNORMAL HIGH (ref 1.50–8.10)
WBC: 14.9 10*3/uL — ABNORMAL HIGH (ref 3.5–11.3)

## 2020-03-09 LAB — COMPREHENSIVE METABOLIC PANEL
ALT: 37 U/L — ABNORMAL HIGH (ref 5–33)
AST: 22 U/L (ref <32)
Albumin/Globulin Ratio: 1.6 (ref 1.0–2.5)
Albumin: 4.5 g/dL (ref 3.5–5.2)
Alkaline Phosphatase: 55 U/L (ref 35–104)
Anion Gap: 15 mmol/L (ref 9–17)
BUN: 9 mg/dL (ref 6–20)
CO2: 24 mmol/L (ref 20–31)
Calcium: 9.6 mg/dL (ref 8.6–10.4)
Chloride: 99 mmol/L (ref 98–107)
Creatinine: 0.68 mg/dL (ref 0.50–0.90)
GFR African American: >60 mL/min (ref >60)
GFR Non-African American: >60 mL/min (ref >60)
Glucose: 137 mg/dL — ABNORMAL HIGH (ref 70–99)
Potassium: 3.8 mmol/L (ref 3.7–5.3)
Sodium: 138 mmol/L (ref 135–144)
Total Bilirubin: <0.1 mg/dL — ABNORMAL LOW (ref 0.3–1.2)
Total Protein: 7.3 g/dL (ref 6.4–8.3)

## 2020-03-09 LAB — LIPASE: Lipase: 38 U/L (ref 13–60)

## 2020-03-09 LAB — SEDIMENTATION RATE: Sed Rate: 7 mm (ref 0–20)

## 2020-03-17 MED ORDER — ROSUVASTATIN CALCIUM 10 MG PO TABS
10 MG | ORAL_TABLET | ORAL | 1 refills | Status: DC
Start: 2020-03-17 — End: 2021-02-14

## 2020-03-17 NOTE — Telephone Encounter (Signed)
Sent requested script per verbal order

## 2020-03-29 MED ORDER — SPIRONOLACTONE-HCTZ 25-25 MG PO TABS
25-25 MG | ORAL_TABLET | ORAL | 1 refills | Status: DC
Start: 2020-03-29 — End: 2020-09-27

## 2020-03-29 NOTE — Telephone Encounter (Signed)
Sent requested script per verbal order

## 2020-05-10 MED ORDER — ESTRADIOL 0.025 MG/24HR TD PTTW
0.025 MG/24HR | MEDICATED_PATCH | TRANSDERMAL | 2 refills | Status: DC
Start: 2020-05-10 — End: 2021-02-09

## 2020-05-10 NOTE — Telephone Encounter (Signed)
Sent requested script per verbal order

## 2020-05-15 ENCOUNTER — Emergency Department: Admit: 2020-05-15 | Payer: BLUE CROSS/BLUE SHIELD | Primary: Family Medicine

## 2020-05-15 ENCOUNTER — Inpatient Hospital Stay
Admit: 2020-05-15 | Discharge: 2020-05-15 | Disposition: A | Discharge status: Home / Self Care | Payer: BLUE CROSS/BLUE SHIELD | Attending: Emergency Medicine

## 2020-05-15 DIAGNOSIS — R0789 Other chest pain: Secondary | ICD-10-CM

## 2020-05-15 LAB — CBC WITH AUTO DIFFERENTIAL
Absolute Eos #: 0.2 10*3/uL (ref 0.0–0.4)
Absolute Lymph #: 1.6 10*3/uL (ref 1.0–4.8)
Absolute Mono #: 0.6 10*3/uL (ref 0.1–1.2)
Basophils Absolute: 0.1 10*3/uL (ref 0.0–0.2)
Basophils: 1 % (ref 0–2)
Eosinophils %: 2 % (ref 1–4)
Hematocrit: 43.8 % (ref 36–46)
Hemoglobin: 14.7 g/dL (ref 12.0–16.0)
Lymphocytes: 14 % — ABNORMAL LOW (ref 24–44)
MCH: 31.3 pg (ref 26–34)
MCHC: 33.5 g/dL (ref 31–37)
MCV: 93.5 fL (ref 80–100)
MPV: 7.4 fL (ref 6.0–12.0)
Monocytes: 5 % (ref 2–11)
Platelets: 376 10*3/uL (ref 140–450)
RBC: 4.69 m/uL (ref 4.0–5.2)
RDW: 13.2 % (ref 12.5–15.4)
Seg Neutrophils: 78 % — ABNORMAL HIGH (ref 36–66)
Segs Absolute: 9.4 10*3/uL — ABNORMAL HIGH (ref 1.8–7.7)
WBC: 12 10*3/uL — ABNORMAL HIGH (ref 3.5–11.0)

## 2020-05-15 LAB — COMPREHENSIVE METABOLIC PANEL
ALT: 41 U/L — ABNORMAL HIGH (ref 5–33)
AST: 31 U/L (ref <32)
Albumin/Globulin Ratio: 1.3 (ref 1.0–2.5)
Albumin: 4.5 g/dL (ref 3.5–5.2)
Alkaline Phosphatase: 61 U/L (ref 35–104)
Anion Gap: 11 mmol/L (ref 9–17)
BUN: 13 mg/dL (ref 6–20)
CO2: 26 mmol/L (ref 20–31)
Calcium: 9.7 mg/dL (ref 8.6–10.4)
Chloride: 93 mmol/L — ABNORMAL LOW (ref 98–107)
Creatinine: 0.58 mg/dL (ref 0.50–0.90)
GFR African American: >60 mL/min (ref >60)
GFR Non-African American: >60 mL/min (ref >60)
Glucose: 191 mg/dL — ABNORMAL HIGH (ref 70–99)
Potassium: 3.7 mmol/L (ref 3.7–5.3)
Sodium: 130 mmol/L — ABNORMAL LOW (ref 135–144)
Total Bilirubin: 0.42 mg/dL (ref 0.3–1.2)
Total Protein: 7.9 g/dL (ref 6.4–8.3)

## 2020-05-15 LAB — URINALYSIS WITH REFLEX TO CULTURE
Bilirubin Urine: NEGATIVE
Glucose, Ur: NEGATIVE
Ketones, Urine: NEGATIVE
Leukocyte Esterase, Urine: NEGATIVE
Nitrite, Urine: NEGATIVE
Protein, UA: NEGATIVE
Specific Gravity, UA: 1.01 (ref 1.005–1.030)
Urine Hgb: NEGATIVE
Urobilinogen, Urine: NORMAL
pH, UA: 6.5 (ref 5.0–8.0)

## 2020-05-15 LAB — LIPASE: Lipase: 30 U/L (ref 13–60)

## 2020-05-15 LAB — TROPONIN: Troponin, High Sensitivity: <6 ng/L (ref 0–14)

## 2020-05-15 LAB — D-DIMER, QUANTITATIVE: D-Dimer, Quant: 0.51 mg/L FEU

## 2020-05-15 MED ORDER — SODIUM CHLORIDE 0.9 % IV BOLUS
0.9 % | Freq: Once | INTRAVENOUS | Status: AC
Start: 2020-05-15 — End: 2020-05-15
  Administered 2020-05-15: 21:00:00 via INTRAVENOUS

## 2020-05-15 MED ORDER — IOPAMIDOL 76 % IV SOLN
76 % | Freq: Once | INTRAVENOUS | Status: AC | PRN
Start: 2020-05-15 — End: 2020-05-15
  Administered 2020-05-15: 20:00:00 via INTRAVENOUS

## 2020-05-15 MED ORDER — NORMAL SALINE FLUSH 0.9 % IV SOLN
0.9 % | INTRAVENOUS | Status: DC | PRN
Start: 2020-05-15 — End: 2020-05-15
  Administered 2020-05-15: 20:00:00 via INTRAVENOUS

## 2020-05-15 MED ORDER — OMEPRAZOLE 40 MG PO CPDR
40 MG | ORAL_CAPSULE | Freq: Every day | ORAL | 0 refills | Status: DC
Start: 2020-05-15 — End: 2020-06-15

## 2020-05-15 MED ORDER — SODIUM CHLORIDE 0.9 % IV BOLUS
0.9 % | Freq: Once | INTRAVENOUS | Status: AC
Start: 2020-05-15 — End: 2020-05-15
  Administered 2020-05-15: 20:00:00 via INTRAVENOUS

## 2020-05-15 NOTE — Discharge Instructions (Addendum)
ACETAMINOPHEN (Tylenol):  [Pain relief, fever reducer]    Pediatric Dosing-    > 50 YRS OLD  =  Adult dosing    1 YR - 50 YRS OLD:  10-15 mg/kg dose every 4-6 hours as needed     DO NOT EXCEED 5 doses/24 hr    Birth - 50 YR OLD:   10-15 mg/kg dose every 6-8 hours as needed       Adult Dosing:    325-650 mg every 4-6 hr as needed    1000 mg  every 4-6hr as needed; NMT 4 g/daily    Extended Relief: 2 caplets (1300 mg) every 8 hrs as needed    DO NOT EXCEED 4000mg DAILY !!!        IBUPROFEN  (Motrin, Advil):  [Anti-inflammatory, Pain relief and Fever reducer)    Pediatric Dosing:  10 mg/kg dose every 8 hours as needed    Adult Dosing:  400-800mg every 8 hrs as needed    DO NOT EXCEED 2400mg DAILY !!!

## 2020-05-15 NOTE — ED Provider Notes (Signed)
Attending Supervisory Note/Shared Visit   I have personally performed a face to face diagnostic evaluation on this patient. I have reviewed the mid-level???s findings and agree.        (Please note that portions of this note were completed with a voice recognition program.  Efforts were made to edit the dictations but occasionally words are mis-transcribed.)    Verne Grain, MD  Attending Emergency Physician        Verne Grain, MD  05/15/20 [phone]

## 2020-05-15 NOTE — ED Provider Notes (Signed)
[facility]  [id number] [facility] [address]  [facility] [place] [address]  Phone: [phone]  Fax: [place]      Pt Name: Catherine Forbes  MRN: [id number]  [place] 12/13/1970  Date of evaluation: 05/15/2020  Provider: Roselie Awkward, PA-C    CHIEF COMPLAINT       Chief Complaint   Patient presents with   ??? Chest Pain     started over the last week on and off         HISTORY OF PRESENT ILLNESS  (Location/Symptom, Timing/Onset, Context/Setting, Quality, Duration, Modifying Factors, Severity.)   Catherine Forbes is a 50 y.o. female who presents to the emergency department complaining of chest symptoms.    Context/Setting:   Patient here for evaluation of intermittent mid chest pressure with some associated tenderness that started about 4 5 days ago but over the last few days seems to be more frequent.  Patient denies any specific chest/back or pleuritic pain associated with this.  Patient states that she did have a few intermittent episodes of some lightheadedness with some associated very mild shortness of breath.  Patient otherwise is having no shortness of breath consistently or any associated cough.  Patient denies any significant headache, neurological changes or generalized achiness.  Patient's had no fever or chills.  Patient states that she does have treated blood pressure but denies any other cardiac or respiratory history.  Patient has no thrombotic history.  Patient denies any new swelling of the leg or pain of the lower extremities.  She does have an estrogen patch.  No injury, lifting or overexertion reported.       Timing/Onset:   As above  Quality:     Pressure   Duration:   As above  Modifying Factors:   Deeper breathing  Severity:   Mild/moderate      Heart Score         Score 0 - 3 = 2.5%  MACE over next 6 wks = Discharge home  Score 4 - 6 = 20.3%  MACE over next 6 wks = Obs admit  Score 7 - 10 = 72.7%  MACE over next 6 wks = Early invasive Rx       HEART Risk  Score:   History         Highly Suspicious                    2                      Moderately Suspicious            1                      Slight Suspicious                     0  EKG            Significant ST Depression       2                      Nonspecific                              1  Normal                                     0  Age              > or = 65                                  2                      > 45 to < 65                             1                     < or = 45                                  0  Risk Factors    > or = 3                         2                          1  or 2                             1                          0                                 0  Troponin          > or = 3 times normal limit       2                          > 1 and < 3 times limit             1                          Normal                                     0  Score               0 - 3      Low Risk                           4 - 6      Moderate risk                           7 - 10    High Risk    * Risk Factors include: Diabetes, Tobacco use, Hypertension, Hyperlipidemia, Family History of CAD  and Obesity    Risk Stratification for Thoracic Aortic Dissection (TAD)  Sudden Onset, Maximal at Onset, with radiation to back -  No  Family history of TAD - No  Neurologic Deficits with Chest Pain -    No  History of connective tissue disorder (i.e. Marfan???s Syndrome, Ehlers-Danlos Syndrome) - No  Turner???s Syndrome - No  History of hypertension - Yes  History of aortic valve disease - No  Pregnancy - No  Elevated D-Dimer with any of the above   pending    Risk Stratification for Pulmonary Embolism (PE)  Previous PE - No  Malignancy - No  Obesity - Yes  Trauma - No  [place] filter - No  Pregnancy/postpartum - No  Smoking - No  Prior DVT - No  Immobilization (i.e. leg cast, travel) - No  Surgery within the last 60 days - No  Coagulation disorder - No  Estrogen medicine -  Yes    Nursing Notes were reviewed.    REVIEW OF SYSTEMS    (2-9 systems for level 4, 10 or more for level 5)     Constitutional: Denies recent fever, chills.  Eyes: No visual changes.    Neck: No neck pain.   Respiratory: per HPI  Cardiac:  per HPI  GI:  Denies abdominal pain/nausea/vomiting/diarrhea.   Musculoskeletal: Denies focal weakness.    Neurologic: per HPI  Skin:  No rash.    Except as noted above the remainder of the review of systems was reviewed and negative.       PAST MEDICAL HISTORY   History reviewed.        Diagnosis Date   ??? Glucose intolerance    ??? History of vitamin D deficiency    ??? Hyperlipidemia    ??? Hypertension          SURGICAL HISTORY           Procedure Laterality Date   ??? BREAST CYST EXCISION Right     Age 50   ??? BREAST SURGERY Right     benign cyst removed    ??? CHOLECYSTECTOMY     ??? COLONOSCOPY  05/29/2019   ??? COLONOSCOPY N/A 05/29/2019    COLONOSCOPY WITH BIOPSY performed by Tyna Jaksch, MD at [facility] OR   ??? HYSTERECTOMY, VAGINAL      pt has had 2 miscarriages, and a DNC also    ??? TONSILLECTOMY         CURRENT MEDICATIONS       Previous Medications    APPLE CIDER VINEGAR PO    Take by mouth    ASCORBIC ACID (VITAMIN C) 250 MG TABLET    Take 250 mg by mouth daily    CLOTRIMAZOLE-BETAMETHASONE (LOTRISONE) 1-0.05 % CREAM    Apply topically 2 times daily.    ELDERBERRY PO    Take by mouth    ESTRADIOL 0.025 MG/24HR PTTW    APPLY 1 PATCH TWICE A WEEK    KRILL OIL 500 MG CAPS    Take by mouth    LISINOPRIL (PRINIVIL;ZESTRIL) 10 MG TABLET    Take 1 tablet by mouth daily    MAGNESIUM OXIDE (MAG-OX) 400 MG TABLET    Take 400 mg by mouth daily    METFORMIN (GLUCOPHAGE-XR) 500 MG EXTENDED RELEASE TABLET    Take two tablets twice daily    PROGESTERONE (PROMETRIUM) 100 MG CAPS CAPSULE    TAKE 1 CAPSULE BY MOUTH AT BEDTIME DAYS 1 THRU 12 OF  EACH MONTH    ROSUVASTATIN (CRESTOR) 10 MG TABLET    TAKE 1 TABLET BY MOUTH EVERY DAY    SEMAGLUTIDE, 1 MG/DOSE, (OZEMPIC, 1 MG/DOSE,) 2 MG/1.5ML SOPN     Inject 1 mg into the skin once a week    SEMAGLUTIDE,0.25 OR 0.5MG /DOS, (OZEMPIC, 0.25 OR 0.5 MG/DOSE,) 2 MG/1.5ML SOPN    Inject 0.5 mg as directed once a week    SPIRONOLACTONE-HYDROCHLOROTHIAZIDE (ALDACTAZIDE) 25-25 MG PER TABLET    TAKE 1 TABLET BY MOUTH EVERY DAY    TRIAMCINOLONE (KENALOG) 0.1 % CREAM    Apply topically 2 times daily.    VITAMIN D (D3-1000) 25 MCG (1000 UT) CAPS    Take by mouth       ALLERGIES     Patient has no known allergies.    FAMILY HISTORY           Problem Relation Age of Onset   ??? Colon Cancer Mother         before age 70    ??? Other Mother         lupus    ??? Diabetes Mother    ??? Dementia Mother    ??? Thyroid Cancer Father      Family Status   Relation Name Status   ??? Mother  (Not Specified)   ??? Father  (Not Specified)        SOCIAL HISTORY      reports that she has never smoked. She has never used smokeless tobacco. She reports previous alcohol use. She reports that she does not use drugs.  Lives with others.  PHYSICAL EXAM    (up to 7 for level 4, 8 or more for level 5)     ED Triage Vitals [05/15/20 1356]   BP Temp Temp Source Pulse Resp SpO2 Height Weight   (!) 165/90 99.1 ??F (37.3 ??C) Oral 120 18 97 % 5\' 3"  (1.6 m) 215 lb (97.5 kg)       Constitutional:  Well developed   Eyes:  Pupils equal/round  HENT:  Atraumatic, external ears normal, nose normal, oropharynx moist. Neck- supple   Respiratory:  Clear to auscultation bilaterally with good air exchange, no W/R/R.  No chestwall tenderness  Cardiovascular:  RRR with normal S1 and S2.  Gastrointestinal/Abdomen:  Soft, NT. BS wnl.  Musculoskeletal:  No edema, no tenderness, no deformities.   Back:  No CVA tenderness.  Normal to inspection.    Integument:  No rash.  Neurologic:  Alert, appropriate mentation/interaction, no focal deficits noted     DIAGNOSTIC RESULTS     EKG: All EKG's are interpreted by the Emergency Department Physician who either signs or Co-signs this chart in the absence of a cardiologist.    Not indicated, or  per attending note      RADIOLOGY:   Non-plain film images such as CT, Ultrasound and MRI are read by the radiologist. Plain radiographic images are visualized and preliminarily interpreted by the emergency physician with the below findings:      Interpretation per the Radiologist below, if available at the time of this note:    CT CHEST PULMONARY EMBOLISM W CONTRAST   Final Result   No evidence of pulmonary embolism or acute pulmonary abnormality.      Multiple chronic fracture deformities of left rib cage.      RECOMMENDATIONS:   Unavailable               LABS:  Labs Reviewed  CBC WITH AUTO DIFFERENTIAL - Abnormal; Notable for the following components:       Result Value    WBC 12.0 (*)     Seg Neutrophils 78 (*)     Lymphocytes 14 (*)     Segs Absolute 9.40 (*)     All other components within normal limits   COMPREHENSIVE METABOLIC PANEL - Abnormal; Notable for the following components:    Glucose 191 (*)     Sodium 130 (*)     Chloride 93 (*)     ALT 41 (*)     All other components within normal limits   LIPASE   URINE RT REFLEX TO CULTURE   TROPONIN   D-DIMER, QUANTITATIVE       All other labs were within normal range or not returned as of this dictation.    EMERGENCY DEPARTMENT COURSE and DIFFERENTIAL DIAGNOSIS/MDM:   Vitals:    Vitals:    05/15/20 1446 05/15/20 1501 05/15/20 1610 05/15/20 1640   BP: (!) 135/93 126/81 130/83 (!) 137/96   Pulse: 110  101 99   Resp:       Temp:       TempSrc:       SpO2: 93%  98% 95%   Weight:       Height:         Orders Placed This Encounter   Medications   ??? 0.9 % sodium chloride bolus   ??? iopamidol (ISOVUE-370) 76 % injection 75 mL   ??? 0.9 % sodium chloride bolus   ??? sodium chloride flush 0.9 % injection 10 mL   ??? omeprazole (PRILOSEC) 40 MG delayed release capsule     Sig: Take 1 capsule by mouth every morning (before breakfast)     Dispense:  30 capsule     Refill:  0       1420  Chest pressure intermittently over the last 4-5 days, no pain but some lightheadedness and  mild SOB.  Tachycardiac but comfortable with LCTA.  No hypoxia or tachypnea.  No pain reported or on exam.      1710  PCP f/u and giving Cardiology f/u for re-evaluation/stress test.  PPI rx provided as well.     This patient was seen by the attending physician and they agreed with the assessment and plan.  I have reviewed the disposition diagnosis with the patient and or their family/guardian.  I have answered their questions and given discharge instructions.  They voiced understanding of these instructions and did not have any further questions or complaints.    The patient presents with chest pain that is not suggestive of in nature of pulmonary embolus, pneumothorax, aortic dissection, cardiac ischemia, aortic dissection, or other serious etiology.  Given the extremely low risk of these diagnoses further testing and evaluation are not indicated at this time.    The patient has been instructed to follow up with their primary care physician and to return immediately to an ED if the symptoms change or worsen in any way.  Patient verbalized understanding.    CONSULTS:  None    PROCEDURES:  None    FINAL IMPRESSION      1. Sensation of chest pressure          DISPOSITION/PLAN   DISPOSITION Decision To Discharge 05/15/2020 04:56:28 PM      PATIENT REFERRED TO:  Neta Mends, MD  [place] [address]  [phone]  Schedule an appointment as soon as possible for a visit in 3 days  for re-evaluation of your symptoms    [place]  [address]  [place] [address]  [phone]    Schedule an appointment as soon as possible for a visit in 3 days  for re-evaluation of your symptoms and discussion of stress test      DISCHARGE MEDICATIONS:  New Prescriptions    OMEPRAZOLE (PRILOSEC) 40 MG DELAYED RELEASE CAPSULE    Take 1 capsule by mouth every morning (before breakfast)       (Please note that portions of this note were completed with a voice recognition program.  Efforts  were made to edit the dictations but occasionally words are mis-transcribed.)    Roselie Awkward, PA-C      Roselie Awkward, PA-C  05/15/20 1710

## 2020-05-17 LAB — EKG 12-LEAD
Atrial Rate: 113 {beats}/min
P Axis: 20 degrees
P-R Interval: 150 ms
Q-T Interval: 324 ms
QRS Duration: 78 ms
QTc Calculation (Bazett): 444 ms
R Axis: 45 degrees
T Axis: 8 degrees
Ventricular Rate: 113 {beats}/min

## 2020-05-19 ENCOUNTER — Ambulatory Visit
Admit: 2020-05-19 | Discharge: 2020-05-19 | Payer: BLUE CROSS/BLUE SHIELD | Attending: Family Medicine | Primary: Family Medicine

## 2020-05-19 ENCOUNTER — Inpatient Hospital Stay: Payer: BLUE CROSS/BLUE SHIELD | Primary: Family Medicine

## 2020-05-19 DIAGNOSIS — E119 Type 2 diabetes mellitus without complications: Secondary | ICD-10-CM

## 2020-05-19 DIAGNOSIS — R0789 Other chest pain: Secondary | ICD-10-CM

## 2020-05-19 LAB — POCT GLYCOSYLATED HEMOGLOBIN (HGB A1C): Hemoglobin A1C: 6.3 %
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
## 2020-05-20 LAB — MICROALBUMIN, UR
Creatinine, Ur: 91 mg/dL (ref 28.0–217.0)
Microalb, Ur: <12 mg/L (ref <21)

## 2020-06-14 ENCOUNTER — Encounter

## 2020-06-14 MED ORDER — OZEMPIC (1 MG/DOSE) 2 MG/1.5ML SC SOPN
21.5 MG/1.5ML | SUBCUTANEOUS | 3 refills | Status: DC
Start: 2020-06-14 — End: 2020-09-27

## 2020-06-14 NOTE — Telephone Encounter (Signed)
From: [name]  To: Dr. Neta Mends  Sent: 06/14/2020 3:43 PM EST  Subject: Ozempic prescription     Could you send my prescription to my pharmacy. They usually call you I believe but they sent a voicemail saying I would have to ask you to send/call them this time.     Thanks,  Catherine Forbes

## 2020-06-14 NOTE — Telephone Encounter (Signed)
Sent requested script/Ozempic per verbal order

## 2020-06-15 MED ORDER — OMEPRAZOLE 40 MG PO CPDR
40 MG | ORAL_CAPSULE | Freq: Every day | ORAL | 2 refills | Status: DC
Start: 2020-06-15 — End: 2021-02-09

## 2020-06-15 NOTE — Telephone Encounter (Signed)
Sent omeprazole refill

## 2020-06-27 ENCOUNTER — Encounter

## 2020-06-28 MED ORDER — LISINOPRIL 10 MG PO TABS
10 MG | ORAL_TABLET | ORAL | 1 refills | Status: DC
Start: 2020-06-28 — End: 2021-05-02

## 2020-07-07 NOTE — Telephone Encounter (Signed)
From: [name]  To: Dr. Neta Mends  Sent: 07/07/2020 4:04 PM EDT  Subject: Recommendation    Hi Dr. Naaman Plummer and Joellen Jersey,    I wanted to ask if there is a cardiologist in the [facility] group that you could recommend for my husband? He was supposed to follow up with one a while back but lost the name of one given to him at the ER. He used to see the PA that left your office a little while back. He has an appointment with Dr Naaman Plummer later this month to start seeing him as his new primary care provider.     Hope you two are doing well, and I???ll see you next month for my appointment,    [name]:)

## 2020-08-31 ENCOUNTER — Encounter
Admit: 2020-08-31 | Discharge: 2020-08-31 | Payer: BLUE CROSS/BLUE SHIELD | Attending: Family Medicine | Primary: Family Medicine

## 2020-08-31 DIAGNOSIS — E119 Type 2 diabetes mellitus without complications: Secondary | ICD-10-CM

## 2020-08-31 LAB — POCT GLYCOSYLATED HEMOGLOBIN (HGB A1C): Hemoglobin A1C: 6.7 %

## 2020-08-31 MED ORDER — RIFAXIMIN 550 MG PO TABS
550 | ORAL_TABLET | Freq: Two times a day (BID) | ORAL | 1 refills | Status: AC
Start: 2020-08-31 — End: 2020-09-14

## 2020-08-31 NOTE — Patient Instructions (Addendum)
Stop metformin so we can see if it helps out with your urgency and diarrhea  Stop turmeric we will give you a prescription for meloxicam to see if that helps with your knee pain  We will try Xifaxan which is what we use for irritable bowel with diarrhea  Avoid caffeine and other bowel stimulants  Stop magnesium  Add Pycnogenol   [employer]  Keep a food diary to see what set you off  Recheck in about 4 weeks if no better will consult gastroenterology     schedule skin tag removal as part of her next visit please

## 2020-08-31 NOTE — Progress Notes (Signed)
Subjective:     Patient ID: Catherine Forbes is a 50 y.o. female    HPI  Catherine Forbes returns today for recheck on her diabetes her hypertension her gastroesophageal reflux and some dizziness.  However her main concern today is fecal urgency.  It seems that she has to get to the bathroom very quickly for a bowel movement and then its very loose.  Although she is on long-acting metformin she did cut back on it to just 2 pills instead of 4 pills a day but still is having the crampy bowel movements which can be very embarrassing for her and I understand.  Denies any blood or pus in the stool and no history of any inflammatory bowel disease.  We discussed many options including Xifaxan and prebiotics and gastro referral.  Her obstructive sleep apnea she thinks is been under pretty good control  She is quite concerned about her inability to lose weight despite having diarrhea  Feels that the Ozempic has been good for her and her A1c today is 6.7.  She is also using turmeric and does not really think it helps with her knees.  Would recommend with the diarrhea that she discontinued that as well and also the magnesium.  We will add meloxicam.  Believe her dizziness may be related to her multiple medications and the process causing her diarrhea.  Really has no neurologic symptoms other than the dizziness    Review of Systems   Constitutional: Negative for fever.   HENT: Negative for congestion, ear pain and sore throat.    Respiratory: Negative for shortness of breath and wheezing.    Cardiovascular: Negative for chest pain and leg swelling.   Gastrointestinal: Positive for diarrhea and nausea. Negative for abdominal pain, anal bleeding and blood in stool.   Genitourinary: Negative for dysuria.   Musculoskeletal: Positive for arthralgias.   Skin: Negative for rash.   Neurological: Positive for dizziness. Negative for syncope, speech difficulty and numbness.   Hematological: Negative for adenopathy.   Psychiatric/Behavioral:  Negative for sleep disturbance. The patient is not nervous/anxious.            Objective:     Physical Exam  Vitals and nursing note reviewed.   Constitutional:       General: She is not in acute distress.     Appearance: Normal appearance. She is obese. She is not ill-appearing.   HENT:      Head: Normocephalic.      Right Ear: External ear normal.      Left Ear: External ear normal.      Nose: Nose normal.      Mouth/Throat:      Mouth: Mucous membranes are moist.      Pharynx: Oropharynx is clear.   Eyes:      Conjunctiva/sclera: Conjunctivae normal.      Pupils: Pupils are equal, round, and reactive to light.   Cardiovascular:      Rate and Rhythm: Normal rate and regular rhythm.      Pulses: Normal pulses.      Heart sounds: Normal heart sounds. No murmur heard.      Pulmonary:      Effort: Pulmonary effort is normal.      Breath sounds: Normal breath sounds. No wheezing.   Musculoskeletal:         General: Normal range of motion.      Cervical back: Neck supple.      Right lower leg: No edema.  Left lower leg: No edema.   Lymphadenopathy:      Cervical: No cervical adenopathy.   Skin:     General: Skin is warm and dry.      Capillary Refill: Capillary refill takes less than 2 seconds.      Comments: Multiple skin tags on her chest and neck   Neurological:      General: No focal deficit present.      Mental Status: She is alert and oriented to person, place, and time. Mental status is at baseline.      Cranial Nerves: No cranial nerve deficit.      Sensory: Sensory deficit present.      Motor: No weakness.      Coordination: Coordination normal.   Psychiatric:         Mood and Affect: Mood normal.         Behavior: Behavior normal.         Assessment/Plan:     1. Type 2 diabetes mellitus without complication, without long-term current use of insulin ([place])    2. Obstructive sleep apnea    3. Diarrhea, unspecified type    4. Irritable bowel syndrome with diarrhea          Catherine Forbes was seen today for diabetes,  hypertension, gastroesophageal reflux, other and dizziness.    Diagnoses and all orders for this visit:    Type 2 diabetes mellitus without complication, without long-term current use of insulin (HCC)  -     POCT glycosylated hemoglobin (Hb A1C)    Obstructive sleep apnea    Diarrhea, unspecified type    Irritable bowel syndrome with diarrhea    Other orders  -     rifAXIMin (XIFAXAN) 550 MG tablet; Take 1 tablet by mouth 2 times daily for 14 days    Stop metformin so we can see if it helps out with your urgency and diarrhea  Stop turmeric we will give you a prescription for meloxicam to see if that helps with your knee pain  We will try Xifaxan which is what we use for irritable bowel with diarrhea  Avoid caffeine and other bowel stimulants  Stop magnesium  Add Pycnogenol   [employer]  Keep a food diary to see what set you off  Recheck in about 4 weeks if no better will consult gastroenterology     schedule skin tag removal as part of her next visit please        Catherine Mends, MD    Please note that this chart was generated using voice recognition Dragon dictation software.  Although every effort was made to ensure the accuracy of this automated transcription, some errors in transcription may have occurred.

## 2020-09-27 ENCOUNTER — Encounter

## 2020-09-27 MED ORDER — OZEMPIC (1 MG/DOSE) 4 MG/3ML SC SOPN
4 MG/3ML | SUBCUTANEOUS | 3 refills | Status: DC
Start: 2020-09-27 — End: 2021-05-02

## 2020-09-27 MED ORDER — SPIRONOLACTONE-HCTZ 25-25 MG PO TABS
25-25 MG | ORAL_TABLET | ORAL | 1 refills | Status: DC
Start: 2020-09-27 — End: 2021-04-06

## 2020-09-29 ENCOUNTER — Ambulatory Visit
Admit: 2020-09-29 | Discharge: 2020-09-29 | Payer: BLUE CROSS/BLUE SHIELD | Attending: Family Medicine | Primary: Family Medicine

## 2020-09-29 DIAGNOSIS — E7439 Other disorders of intestinal carbohydrate absorption: Secondary | ICD-10-CM

## 2020-09-29 MED ORDER — TRAMADOL HCL 50 MG PO TABS
50 MG | ORAL_TABLET | ORAL | 0 refills | Status: AC | PRN
Start: 2020-09-29 — End: 2020-10-04

## 2020-09-29 NOTE — Progress Notes (Signed)
Subjective:     Patient ID: Catherine Forbes is a 50 y.o. female    HPI  Catherine Forbes has been on vacation and during this time she stopped the metformin the turmeric as directed to help with her diarrhea.  We also tried her on Xifaxan and she kept a food diary but really only improved slightly.  Reviewed her supplement list however she is still taking apple cider vinegar vitamin C elderberry and krill oil.  I recommended that she stop all of those  She also stopped her hormone replacement therapy because she thinks it was adding to her dizziness and her nausea.  She has been up both off the estradiol and the progesterone for about 2 weeks now and feels notably better.  Her blood sugars been under good control with just the Ozempic.  Continues to take the lisinopril and Aldactazide for her blood pressure which now seems to be under great control.  Also taking Crestor for her hyperlipidemia.  She also feels that she wrenched her left low back while on vacation and riding in the car made it worse she has some spasms that radiate into her left leg.  She has a number of skin tags that she would like to have removed today  Review of Systems   Constitutional: Negative for fever.   HENT: Negative for congestion, ear pain and sore throat.    Respiratory: Negative for shortness of breath and wheezing.    Cardiovascular: Negative for chest pain and leg swelling.   Gastrointestinal: Positive for diarrhea and nausea. Negative for abdominal pain, anal bleeding and blood in stool.   Genitourinary: Negative for dysuria.   Musculoskeletal: Negative for arthralgias.   Skin: Negative for rash.   Neurological: Negative for dizziness, syncope, speech difficulty and numbness.   Hematological: Negative for adenopathy.   Psychiatric/Behavioral: Negative for sleep disturbance. The patient is not nervous/anxious.            Objective:     Physical Exam  Vitals and nursing note reviewed.   Constitutional:       General: She is not in acute  distress.     Appearance: Normal appearance. She is obese. She is not ill-appearing.   HENT:      Head: Normocephalic.      Right Ear: External ear normal.      Left Ear: External ear normal.      Nose: Nose normal.      Mouth/Throat:      Mouth: Mucous membranes are moist.      Pharynx: Oropharynx is clear.   Eyes:      Conjunctiva/sclera: Conjunctivae normal.      Pupils: Pupils are equal, round, and reactive to light.   Cardiovascular:      Rate and Rhythm: Normal rate and regular rhythm.      Pulses: Normal pulses.      Heart sounds: Normal heart sounds. No murmur heard.      Pulmonary:      Effort: Pulmonary effort is normal.      Breath sounds: Normal breath sounds. No wheezing.   Musculoskeletal:         General: Tenderness and deformity present.      Cervical back: Neck supple.      Right lower leg: No edema.      Left lower leg: No edema.      Comments: Notable muscle spasm in the left lumbosacral fascia.  She also has notable scoliosis.  No red  flag symptoms or signs   Lymphadenopathy:      Cervical: No cervical adenopathy.   Skin:     General: Skin is warm and dry.      Capillary Refill: Capillary refill takes less than 2 seconds.      Comments: Multiple skin tags on her chest and neck   Neurological:      General: No focal deficit present.      Mental Status: She is alert and oriented to person, place, and time. Mental status is at baseline.      Cranial Nerves: No cranial nerve deficit.      Sensory: No sensory deficit.      Motor: No weakness.      Coordination: Coordination normal.   Psychiatric:         Mood and Affect: Mood normal.         Behavior: Behavior normal.         Assessment/Plan:     1. Glucose intolerance    2. Diarrhea, unspecified type    3. Left-sided low back pain without sciatica, unspecified chronicity    4. Skin tags, multiple acquired          Catherine Forbes was seen today for diabetes, other, hypertension, gastroesophageal reflux and back pain.    Diagnoses and all orders for this  visit:    Glucose intolerance  Sugars seem to be stable just on Ozempic.  Would recommend strict dietary compliance and monitoring  Diarrhea, unspecified type  We will get rid of the rest of her supplements and clean up her medications is much as possible to see how she does.  She had a colonoscopy last year.  Could consider doing some stool studies on her.  I still believe she is getting into something that setting off  Left-sided low back pain without sciatica, unspecified chronicity  -     traMADol (ULTRAM) 50 MG tablet; Take 1 tablet by mouth every 4 hours as needed for Pain for up to 5 days. Intended supply: 5 days. Take lowest dose possible to manage pain.  Recommend ice for pain keep for spasms.  Exercises instructed.    Skin tags, multiple acquired  Procedure note.  Anticipated procedure was discussed with the patient including potential complications of bleeding and infection she is agreeable.  The area on her chest is scrubbed in the usual fashion she has perhaps 12 skin tags varying in size from 46mm to 6 mm in maximum diameter.  5 of the 12 appear irritated and inflamed.  Each is clipped with a [employer] scissors with the assistance of an [employer].  The base is then cauterized with silver nitrate.  Post procedure instructions given to the patient.  Patient tolerated procedure well        Catherine Mends, MD    Please note that this chart was generated using voice recognition Dragon dictation software.  Although every effort was made to ensure the accuracy of this automated transcription, some errors in transcription may have occurred.

## 2020-09-29 NOTE — Patient Instructions (Signed)
Stop all supplements

## 2020-12-09 ENCOUNTER — Ambulatory Visit
Admit: 2020-12-09 | Discharge: 2020-12-09 | Payer: BLUE CROSS/BLUE SHIELD | Attending: Family Medicine | Primary: Family Medicine

## 2020-12-09 DIAGNOSIS — E119 Type 2 diabetes mellitus without complications: Secondary | ICD-10-CM

## 2020-12-09 LAB — POCT GLYCOSYLATED HEMOGLOBIN (HGB A1C): Hemoglobin A1C: 6.8 %

## 2020-12-09 NOTE — Progress Notes (Signed)
Subjective:     Patient ID: Catherine Forbes is a 50 y.o. female    HPI  Catherine Forbes returns today for a follow-up on her diabetes her hypertension but most importantly her recurrent loose stool.  Loose stool after every breakfast.  She has tried multiple dietary modifications and has kept a food diary with no improvement.  She is quite frustrated and that the diarrhea may be urgent and embarrassing in addition she really has not lost any weight with it.  We discussed a FODMAP diet with her and patient instructions are given.  Recommend that she take Imodium half an hour before meals.    Currently taking Ozempic  1 mg weekly   .  I believe she would be a good candidate for [facility].  She is agreeable and familiar with potential side effects.  We will start her on samples today    Review of Systems   Constitutional:  Negative for fever.   HENT:  Negative for congestion, ear pain and sore throat.    Respiratory:  Negative for shortness of breath and wheezing.    Cardiovascular:  Negative for chest pain and leg swelling.   Gastrointestinal:  Positive for diarrhea. Negative for abdominal pain and blood in stool.   Genitourinary:  Negative for dysuria.   Skin:  Negative for rash.   Neurological:  Negative for syncope.   Hematological:  Negative for adenopathy.         Objective:     Physical Exam  Vitals and nursing note reviewed.   Constitutional:       General: She is not in acute distress.     Appearance: Normal appearance. She is obese.   HENT:      Head: Normocephalic.      Right Ear: External ear normal.      Left Ear: External ear normal.      Nose: Nose normal.      Mouth/Throat:      Mouth: Mucous membranes are moist.   Eyes:      Conjunctiva/sclera: Conjunctivae normal.   Cardiovascular:      Rate and Rhythm: Normal rate and regular rhythm.      Heart sounds: Normal heart sounds.   Pulmonary:      Effort: Pulmonary effort is normal.      Breath sounds: Normal breath sounds.   Musculoskeletal:      Cervical back:  Normal range of motion.      Right lower leg: No edema.      Left lower leg: No edema.   Skin:     General: Skin is warm and dry.   Neurological:      General: No focal deficit present.      Mental Status: She is alert and oriented to person, place, and time.   Psychiatric:         Mood and Affect: Mood normal.         Behavior: Behavior normal.       Assessment/Plan:     1. Type 2 diabetes mellitus without complication, without long-term current use of insulin ([place])    2. Diarrhea, unspecified type    3. Essential hypertension          Catherine Forbes was seen today for lower back pain, diarrhea and other.    Diagnoses and all orders for this visit:    Type 2 diabetes mellitus without complication, without long-term current use of insulin (HCC)  -     POCT  glycosylated hemoglobin (Hb A1C)  -     Tirzepatide (MOUNJARO) 2.5 MG/0.5ML SOPN SC injection; Inject 0.5 mLs into the skin once a week    Diarrhea, unspecified type  -     rifAXIMin (XIFAXAN) 200 MG tablet; Take 1 tablet by mouth daily for 3 days  FODMAP diet.  Judicious use of Imodium  Essential hypertension  Good control with current meds  Follow-up in 1 month    Catherine Mends, MD    Please note that this chart was generated using voice recognition Dragon dictation software.  Although every effort was made to ensure the accuracy of this automated transcription, some errors in transcription may have occurred.

## 2020-12-09 NOTE — Patient Instructions (Addendum)
Recommend change to [facility] starting dose    Trial on Xifaxin samples     Take Imodium on as needed basis    FODMAP diet

## 2020-12-15 MED ORDER — RIFAXIMIN 200 MG PO TABS
200 MG | ORAL_TABLET | Freq: Every day | ORAL | 0 refills | Status: AC
Start: 2020-12-15 — End: 2020-12-17

## 2020-12-15 MED ORDER — MOUNJARO 2.5 MG/0.5ML SC SOPN
2.5 | PEN_INJECTOR | SUBCUTANEOUS | 0 refills | Status: DC
Start: 2020-12-15 — End: 2021-02-09

## 2021-01-19 ENCOUNTER — Inpatient Hospital Stay: Payer: BLUE CROSS/BLUE SHIELD | Primary: Family Medicine

## 2021-01-19 ENCOUNTER — Ambulatory Visit
Admit: 2021-01-19 | Discharge: 2021-01-19 | Payer: BLUE CROSS/BLUE SHIELD | Attending: Family Medicine | Primary: Family Medicine

## 2021-01-19 DIAGNOSIS — E119 Type 2 diabetes mellitus without complications: Secondary | ICD-10-CM

## 2021-01-19 DIAGNOSIS — R5383 Other fatigue: Secondary | ICD-10-CM

## 2021-01-19 NOTE — Progress Notes (Signed)
Subjective:     Patient ID: Catherine Forbes is a 50 y.o. female    HPI  Catherine Forbes returns today and could not tolerate [place]   she said it made her hungry and put her on sugar binges.  She would like to go back to Ozempic.  Her diarrhea is some better but she is very sensitive to certain foods that may set her off.  Her hypertension has been under good control she still has some pain in the right knee but is overall stable.  Her main complaint today is fatigue we will recheck her labs today including her thyroids      Review of Systems   Constitutional:  Positive for fatigue. Negative for activity change, appetite change and fever.   HENT:  Negative for sore throat.    Eyes:  Negative for visual disturbance.   Respiratory:  Negative for cough, chest tightness and shortness of breath.    Cardiovascular:  Negative for chest pain, palpitations and leg swelling.   Gastrointestinal:  Negative for abdominal pain, constipation and diarrhea.   Endocrine: Negative for cold intolerance.   Genitourinary:  Negative for dysuria and urgency.   Musculoskeletal:  Positive for arthralgias. Negative for back pain.   Neurological:  Negative for dizziness, syncope and headaches.   Hematological:  Does not bruise/bleed easily.   Psychiatric/Behavioral:  Negative for confusion and sleep disturbance. The patient is not nervous/anxious.          Objective:     Physical Exam  Vitals and nursing note reviewed.   Constitutional:       Appearance: Normal appearance.   HENT:      Head: Normocephalic.      Right Ear: External ear normal.      Left Ear: External ear normal.      Nose: Nose normal.      Mouth/Throat:      Mouth: Mucous membranes are moist.      Pharynx: Oropharynx is clear.   Eyes:      Conjunctiva/sclera: Conjunctivae normal.      Pupils: Pupils are equal, round, and reactive to light.   Cardiovascular:      Rate and Rhythm: Normal rate and regular rhythm.      Pulses: Normal pulses.      Heart sounds: Normal heart sounds. No  murmur heard.  Pulmonary:      Effort: Pulmonary effort is normal.      Breath sounds: Normal breath sounds. No wheezing.   Musculoskeletal:         General: Normal range of motion.      Cervical back: Neck supple.      Right lower leg: No edema.      Left lower leg: No edema.      Comments: Some tenderness around the patella consistent with a bursitis.  No real joint effusion seen today.  No real joint pain tenderness   Lymphadenopathy:      Cervical: No cervical adenopathy.   Skin:     General: Skin is warm and dry.      Capillary Refill: Capillary refill takes less than 2 seconds.   Neurological:      General: No focal deficit present.      Mental Status: She is alert and oriented to person, place, and time.   Psychiatric:         Mood and Affect: Mood normal.         Behavior: Behavior normal.  Assessment/Plan:     1. Type 2 diabetes mellitus without complication, without long-term current use of insulin ([place])    2. Diarrhea, unspecified type    3. Essential hypertension    4. Recurrent pain of right knee          Catherine Forbes was seen today for diabetes, diarrhea, knee pain and fatigue.    Diagnoses and all orders for this visit:    Type 2 diabetes mellitus without complication, without long-term current use of insulin (HCC)  Change back to Ozempic  Diarrhea, unspecified type  Discussed other options in regard to her diarrhea but she thinks she is overall somewhat better  Essential hypertension  Continue same meds at this point in time  Recurrent pain of right knee  Ice and remember to stay off her knee in a crouch      Neta Mends, MD    Please note that this chart was generated using voice recognition Dragon dictation software.  Although every effort was made to ensure the accuracy of this automated transcription, some errors in transcription may have occurred.

## 2021-01-20 LAB — T4, FREE: Thyroxine, Free: 0.95 ng/dL (ref 0.93–1.70)

## 2021-01-20 LAB — CBC WITH AUTO DIFFERENTIAL
Absolute Eos #: 1.05 10*3/uL — ABNORMAL HIGH (ref 0.00–0.44)
Absolute Immature Granulocyte: 0.09 10*3/uL (ref 0.00–0.30)
Absolute Lymph #: 2.65 10*3/uL (ref 1.10–3.70)
Absolute Mono #: 0.95 10*3/uL (ref 0.10–1.20)
Basophils Absolute: 0.09 10*3/uL (ref 0.00–0.20)
Basophils: 1 % (ref 0–2)
Eosinophils %: 7 % — ABNORMAL HIGH (ref 1–4)
Hematocrit: 43.5 % (ref 36.3–47.1)
Hemoglobin: 14.4 g/dL (ref 11.9–15.1)
Immature Granulocytes: 1 % — ABNORMAL HIGH
Lymphocytes: 16 % — ABNORMAL LOW (ref 24–43)
MCH: 32.3 pg (ref 25.2–33.5)
MCHC: 33.1 g/dL (ref 28.4–34.8)
MCV: 97.5 fL (ref 82.6–102.9)
MPV: 10.3 fL (ref 8.1–13.5)
Monocytes: 6 % (ref 3–12)
NRBC Automated: 0 per 100 WBC
Platelets: 346 10*3/uL (ref 138–453)
RBC: 4.46 m/uL (ref 3.95–5.11)
RDW: 12.2 % (ref 11.8–14.4)
Seg Neutrophils: 69 % — ABNORMAL HIGH (ref 36–65)
Segs Absolute: 11.35 10*3/uL — ABNORMAL HIGH (ref 1.50–8.10)
WBC: 16.2 10*3/uL — ABNORMAL HIGH (ref 3.5–11.3)

## 2021-01-20 LAB — TSH: TSH: 0.88 u[IU]/mL (ref 0.30–5.00)

## 2021-01-24 MED ORDER — LEVOTHYROXINE SODIUM 50 MCG PO TABS
50 MCG | ORAL_TABLET | Freq: Every day | ORAL | 5 refills | Status: DC
Start: 2021-01-24 — End: 2021-02-17

## 2021-02-09 ENCOUNTER — Ambulatory Visit: Admit: 2021-02-09 | Discharge: 2021-02-09 | Payer: BLUE CROSS/BLUE SHIELD | Attending: Family | Primary: Family Medicine

## 2021-02-09 DIAGNOSIS — H1032 Unspecified acute conjunctivitis, left eye: Secondary | ICD-10-CM

## 2021-02-09 MED ORDER — SULFACETAMIDE SODIUM 10 % OP SOLN
10 % | Freq: Four times a day (QID) | OPHTHALMIC | 0 refills | Status: AC
Start: 2021-02-09 — End: 2021-02-16

## 2021-02-09 NOTE — Progress Notes (Signed)
[facility]  [place],  [place] [address]  Dept: [phone]    Catherine Forbes is a 50 y.o. female Established patient, who presents to the walk-in clinic today with conditions/complaints as noted below:    Chief Complaint   Patient presents with    Eye Problem     Left eye. Redness and watery. Started 2 weeks ago. No burning or pain.          HPI:     Patient presents to walk in clinic today with concerns about left eye redness and watery for last 2 weeks. Redness is intermittent. Eye will be red for 2-3 days then fine for 1 day then return. Not itchy or painful. Eye feels heavy. Thought it was related to her contacts as she wears month long disposables, but she changed her contact and symptoms did not improve.     Eye Problem   The left eye is affected. This is a new problem. The current episode started 1 to 4 weeks ago. The problem occurs intermittently. The patient is experiencing no pain. She Wears contacts. Associated symptoms include an eye discharge (tearing) and eye redness. Pertinent negatives include no blurred vision, double vision, fever, foreign body sensation, itching, nausea, photophobia, recent URI or vomiting.     Past Medical History:   Diagnosis Date    Glucose intolerance     History of vitamin D deficiency     Hyperlipidemia     Hypertension        Current Outpatient Medications   Medication Sig Dispense Refill    sulfacetamide (BLEPH-10) 10 % ophthalmic solution Place 2 drops into the left eye 4 times daily for 7 days 1 each 0    levothyroxine (SYNTHROID) 50 MCG tablet Take 1 tablet by mouth daily 30 tablet 5    Zinc 100 MG TABS Take by mouth      spironolactone-hydroCHLOROthiazide (ALDACTAZIDE) 25-25 MG per tablet TAKE 1 TABLET BY MOUTH EVERY DAY 90 tablet 1    OZEMPIC, 1 MG/DOSE, 4 MG/3ML SOPN INJECT 1 MG INTO THE SKIN ONCE A WEEK 3 mL 3    lisinopril (PRINIVIL;ZESTRIL) 10 MG tablet TAKE  1 TABLET BY MOUTH EVERY DAY 90 tablet 1    rosuvastatin (CRESTOR) 10 MG tablet TAKE 1 TABLET BY MOUTH EVERY DAY (Patient taking differently: Take 10 mg by mouth every other day) 90 tablet 1    vitamin D (D3-1000) 25 MCG (1000 UT) CAPS Take by mouth       No current facility-administered medications for this visit.       No Known Allergies    :     Review of Systems   Constitutional:  Negative for activity change and fever.   HENT:  Negative for congestion, ear pain, postnasal drip, rhinorrhea and sore throat.    Eyes:  Positive for discharge (tearing) and redness. Negative for blurred vision, double vision, photophobia, pain, itching and visual disturbance.   Respiratory:  Negative for cough and shortness of breath.    Cardiovascular:  Negative for chest pain.   Gastrointestinal:  Negative for nausea and vomiting.   Skin:  Negative for rash.   Neurological:  Negative for dizziness and headaches.   Psychiatric/Behavioral:  Negative for sleep disturbance.      :     BP (!) 138/92    Temp 97.5 ??F (36.4 ??C)    Ht 5\' 3"  (  1.6 m)    Wt 229 lb 3.2 oz (104 kg)    BMI 40.60 kg/m??     Physical Exam  Vitals and nursing note reviewed.   Constitutional:       General: She is not in acute distress.     Appearance: She is not toxic-appearing.   HENT:      Head: Normocephalic and atraumatic.      Right Ear: External ear normal.      Left Ear: External ear normal.      Nose: Nose normal.   Eyes:      General: No allergic shiner or scleral icterus.        Right eye: No foreign body or discharge.         Left eye: No foreign body or discharge.      Extraocular Movements: Extraocular movements intact.      Conjunctiva/sclera:      Right eye: No exudate or hemorrhage.     Left eye: Left conjunctiva is injected. No exudate or hemorrhage.     Pupils: Pupils are equal, round, and reactive to light.   Cardiovascular:      Rate and Rhythm: Normal rate and regular rhythm.   Pulmonary:      Effort: Pulmonary effort is normal. No respiratory  distress.      Breath sounds: Normal breath sounds. No wheezing.   Skin:     Findings: No rash.   Neurological:      Mental Status: She is alert and oriented to person, place, and time.   Psychiatric:         Mood and Affect: Mood normal.         Behavior: Behavior normal.         :          1. Acute bacterial conjunctivitis of left eye  -     sulfacetamide (BLEPH-10) 10 % ophthalmic solution; Place 2 drops into the left eye 4 times daily for 7 days, Disp-1 each, R-0Normal       Will treat with antibiotics as her symptoms have been persisting for 2 weeks.   Keep eye clean and dry.   No contacts until infection resolved.   Monitor for worsening symptoms.   Call office with concerns.   :      Return if symptoms worsen or fail to improve.    Orders Placed This Encounter   Medications    sulfacetamide (BLEPH-10) 10 % ophthalmic solution     Sig: Place 2 drops into the left eye 4 times daily for 7 days     Dispense:  1 each     Refill:  0             Patient and/or parent given educational materials - see patient instructions.  Discussed use, benefit, and side effects of prescribed medications.  All patient questions answered.  Patient and/or parent voiced understanding.      Electronically signed by Harriett Rush, APRN - CNPon 02/09/2021 at 1:26 PM

## 2021-02-11 ENCOUNTER — Encounter

## 2021-02-14 ENCOUNTER — Encounter

## 2021-02-14 MED ORDER — ROSUVASTATIN CALCIUM 10 MG PO TABS
10 MG | ORAL_TABLET | ORAL | 2 refills | Status: DC
Start: 2021-02-14 — End: 2021-09-13

## 2021-02-14 NOTE — Telephone Encounter (Signed)
Last visit: 01/19/21  Last Med refill: 03/17/20  Does patient have enough medication for 72 hours: Yes    Next Visit Date:  Future Appointments   Date Time Provider [place]   05/04/2021  9:00 AM Neta Mends, MD [place] [facility]   Topic Date Due    COVID-19 Vaccine (1) Never done    Pneumococcal 0-64 years Vaccine (1 - PCV) Never done    Lipids  05/01/2020    Flu vaccine (1) Never done    Shingles vaccine (1 of 2) Never done    Hepatitis B vaccine (1 of 3 - Risk 3-dose series) 05/19/2021 (Originally 11/22/1989)    Diabetic retinal exam  08/22/2021 (Originally 05/14/2020)    Diabetic foot exam  05/19/2021    Diabetic microalbuminuria test  05/19/2021    A1C test (Diabetic or Prediabetic)  12/09/2021    Depression Screen  02/09/2022    Breast cancer screen  02/25/2022    DTaP/Tdap/Td vaccine (2 - Td or Tdap) 04/25/2023    Colorectal Cancer Screen  05/28/2029    Hepatitis A vaccine  Aged Out    Hib vaccine  Aged Out    Meningococcal (ACWY) vaccine  Aged Out    Hepatitis C screen  Discontinued    HIV screen  Discontinued       Hemoglobin A1C (%)   Date Value   12/09/2020 6.8   08/31/2020 6.7   05/19/2020 6.3             ( goal A1C is < 7)   Microalb/Crt. Ratio (mcg/mg creat)   Date Value   05/19/2020 Can not be calculated     LDL Cholesterol (mg/dL)   Date Value   05/02/2019            (goal LDL is <100)   AST (U/L)   Date Value   05/15/2020 31     ALT (U/L)   Date Value   05/15/2020 41 (H)     BUN (mg/dL)   Date Value   05/15/2020 13     BP Readings from Last 3 Encounters:   02/09/21 (!) 138/92   01/19/21 136/86   12/09/20 130/80          (goal 120/80)    All Future Testing planned in [facility]              Patient Active Problem List:     Adult body mass index 40 and over     Essential hypertension     Family history of malignant neoplasm of colon     History of total hysterectomy     History of cholecystectomy     Hyperlipidemia     Polycystic ovary syndrome     Uncontrolled type 2  diabetes mellitus     Vitamin D deficiency     Glucose intolerance     Obstructive sleep apnea     Type 2 diabetes mellitus without complication ([place])

## 2021-02-17 MED ORDER — LEVOTHYROXINE SODIUM 50 MCG PO TABS
50 MCG | ORAL_TABLET | ORAL | 5 refills | Status: DC
Start: 2021-02-17 — End: 2021-08-29

## 2021-02-17 NOTE — Telephone Encounter (Signed)
Last visit: 01/19/21  Last Med refill: 01/23/21      Next Visit Date:  Future Appointments   Date Time Provider [place]   05/04/2021  9:00 AM Neta Mends, MD [place] [facility]   Topic Date Due    COVID-19 Vaccine (1) Never done    Pneumococcal 0-64 years Vaccine (1 - PCV) Never done    Lipids  05/01/2020    Flu vaccine (1) Never done    Shingles vaccine (1 of 2) Never done    Hepatitis B vaccine (1 of 3 - Risk 3-dose series) 05/19/2021 (Originally 11/22/1989)    Diabetic retinal exam  08/22/2021 (Originally 05/14/2020)    Diabetic foot exam  05/19/2021    Diabetic microalbuminuria test  05/19/2021    A1C test (Diabetic or Prediabetic)  12/09/2021    Depression Screen  02/09/2022    Breast cancer screen  02/25/2022    DTaP/Tdap/Td vaccine (2 - Td or Tdap) 04/25/2023    Colorectal Cancer Screen  05/28/2029    Hepatitis A vaccine  Aged Out    Hib vaccine  Aged Out    Meningococcal (ACWY) vaccine  Aged Out    Hepatitis C screen  Discontinued    HIV screen  Discontinued       Hemoglobin A1C (%)   Date Value   12/09/2020 6.8   08/31/2020 6.7   05/19/2020 6.3             ( goal A1C is < 7)   Microalb/Crt. Ratio (mcg/mg creat)   Date Value   05/19/2020 Can not be calculated     LDL Cholesterol (mg/dL)   Date Value   05/02/2019            (goal LDL is <100)   AST (U/L)   Date Value   05/15/2020 31     ALT (U/L)   Date Value   05/15/2020 41 (H)     BUN (mg/dL)   Date Value   05/15/2020 13     BP Readings from Last 3 Encounters:   02/09/21 (!) 138/92   01/19/21 136/86   12/09/20 130/80          (goal 120/80)    All Future Testing planned in [facility]              Patient Active Problem List:     Adult body mass index 40 and over     Essential hypertension     Family history of malignant neoplasm of colon     History of total hysterectomy     History of cholecystectomy     Hyperlipidemia     Polycystic ovary syndrome     Uncontrolled type 2 diabetes mellitus     Vitamin D deficiency     Glucose  intolerance     Obstructive sleep apnea     Type 2 diabetes mellitus without complication ([place])
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
## 2021-02-24 ENCOUNTER — Ambulatory Visit
Admit: 2021-02-24 | Discharge: 2021-02-24 | Payer: BLUE CROSS/BLUE SHIELD | Attending: Family Medicine | Primary: Family Medicine

## 2021-02-24 DIAGNOSIS — H109 Unspecified conjunctivitis: Secondary | ICD-10-CM

## 2021-02-24 MED ORDER — TOBRAMYCIN 0.3 % OP SOLN
0.3 % | OPHTHALMIC | 0 refills | Status: AC
Start: 2021-02-24 — End: 2021-03-06

## 2021-02-24 NOTE — Progress Notes (Signed)
Subjective:     Patient ID: Catherine Forbes is a 50 y.o. female    HPI  Catherine Forbes returns today for a red runny left eye there is some pain associated with it for the past 2 weeks.  She has tried some antibiotic drops without relief.  Has changed her contacts and try to rest her eyes.  Denies any change in visual acuity and no real pain behind the eye.  But she is concerned about persistence of the redness as well as the clear discharge.  Also complains of some chronic low back pain.  She has a history of a pretty severe scoliosis in the thoracolumbar spine.  Has not had any recent therapy for the back nor does she regularly exercise or stretch the back.  Discussed physical therapy and she is agreeable      Review of Systems   Constitutional:  Negative for fever.   HENT:  Negative for congestion, ear pain and sore throat.    Eyes:  Positive for pain, discharge and itching.   Respiratory:  Negative for shortness of breath and wheezing.    Cardiovascular:  Negative for chest pain and leg swelling.   Gastrointestinal:  Negative for abdominal pain.   Genitourinary:  Negative for dysuria.   Musculoskeletal:  Positive for back pain.   Skin:  Negative for rash.   Neurological:  Negative for syncope.   Hematological:  Negative for adenopathy.         Objective:     Physical Exam  Vitals and nursing note reviewed.   Constitutional:       Appearance: Normal appearance.   HENT:      Head: Normocephalic.      Right Ear: External ear normal.      Left Ear: External ear normal.      Nose: Nose normal.      Mouth/Throat:      Mouth: Mucous membranes are moist.      Pharynx: Oropharynx is clear.   Eyes:      General: Vision grossly intact. Gaze aligned appropriately. No scleral icterus.        Right eye: No discharge.         Left eye: Discharge present.     Extraocular Movements: Extraocular movements intact.      Conjunctiva/sclera:      Left eye: Left conjunctiva is injected. No exudate.     Pupils: Pupils are equal, round, and  reactive to light.   Cardiovascular:      Rate and Rhythm: Normal rate and regular rhythm.      Pulses: Normal pulses.      Heart sounds: Normal heart sounds. No murmur heard.  Pulmonary:      Effort: Pulmonary effort is normal.      Breath sounds: Normal breath sounds. No wheezing.   Musculoskeletal:      Cervical back: Neck supple.      Right lower leg: No edema.      Left lower leg: No edema.      Comments: Notable scoliosis concave left thoracolumbar spine.  Has associated paravertebral muscle spasm   Lymphadenopathy:      Cervical: No cervical adenopathy.   Skin:     General: Skin is warm and dry.      Capillary Refill: Capillary refill takes less than 2 seconds.   Neurological:      General: No focal deficit present.      Mental Status: She is alert and oriented  to person, place, and time.   Psychiatric:         Mood and Affect: Mood normal.         Behavior: Behavior normal.       Assessment/Plan:     1. Conjunctivitis of left eye, unspecified conjunctivitis type    2. Chronic low back pain without sciatica, unspecified back pain laterality    3. Scoliosis of thoracolumbar spine, unspecified scoliosis type          Catherine Forbes was seen today for back pain and eye problem.    Diagnoses and all orders for this visit:    Conjunctivitis of left eye, unspecified conjunctivitis type  -     Ambulatory referral to Ophthalmology  Ice to left eye then rinse inside out 3-4 times daily  Use drops after wash  Follow up with ophthalmology  Chronic low back pain without sciatica, unspecified back pain laterality  -     External Referral To Physical Therapy  Refer for Physical Therapy  Use Ice for pain  Heat for spasm  Salonpas patches  Scoliosis of thoracolumbar spine, unspecified scoliosis type  -     External Referral To Physical Therapy    Other orders  -     tobramycin (TOBREX) 0.3 % ophthalmic solution; Place 1 drop into the left eye every 4 hours for 10 days        Neta Mends, MD    Please note that this chart was  generated using voice recognition Dragon dictation software.  Although every effort was made to ensure the accuracy of this automated transcription, some errors in transcription may have occurred.

## 2021-02-24 NOTE — Patient Instructions (Signed)
Ice to left eye then rinse inside out 3-4 times daily  Use drops after wash  Follow up with ophthalmology    Refer for Physical Therapy  Use Ice for pain  Heat for spasm  Salonpas patches

## 2021-04-06 MED ORDER — SPIRONOLACTONE-HCTZ 25-25 MG PO TABS
25-25 MG | ORAL_TABLET | Freq: Every day | ORAL | 1 refills | Status: DC
Start: 2021-04-06 — End: 2021-09-20

## 2021-04-06 NOTE — Telephone Encounter (Signed)
Last visit: 02/24/2021  Last Med refill: 09/27/2020      Next Visit Date:  Future Appointments   Date Time Provider [place]   05/04/2021  9:00 AM Neta Mends, MD [place] [facility]   Topic Date Due    COVID-19 Vaccine (1) Never done    Pneumococcal 0-64 years Vaccine (1 - PCV) Never done    Lipids  05/01/2020    Flu vaccine (1) Never done    Shingles vaccine (1 of 2) Never done    Hepatitis B vaccine (1 of 3 - Risk 3-dose series) 05/19/2021 (Originally 11/22/1989)    Diabetic retinal exam  08/22/2021 (Originally 05/14/2020)    Diabetic foot exam  05/19/2021    Diabetic microalbuminuria test  05/19/2021    A1C test (Diabetic or Prediabetic)  12/09/2021    Depression Screen  02/24/2022    Breast cancer screen  02/25/2022    DTaP/Tdap/Td vaccine (2 - Td or Tdap) 04/25/2023    Colorectal Cancer Screen  05/28/2029    Hepatitis A vaccine  Aged Out    Hib vaccine  Aged Out    Meningococcal (ACWY) vaccine  Aged Out    Hepatitis C screen  Discontinued    HIV screen  Discontinued       Hemoglobin A1C (%)   Date Value   12/09/2020 6.8   08/31/2020 6.7   05/19/2020 6.3             ( goal A1C is < 7)   Microalb/Crt. Ratio (mcg/mg creat)   Date Value   05/19/2020 Can not be calculated     LDL Cholesterol (mg/dL)   Date Value   05/02/2019            (goal LDL is <100)   AST (U/L)   Date Value   05/15/2020 31     ALT (U/L)   Date Value   05/15/2020 41 (H)     BUN (mg/dL)   Date Value   05/15/2020 13     BP Readings from Last 3 Encounters:   02/24/21 130/82   02/09/21 (!) 138/92   01/19/21 136/86          (goal 120/80)    All Future Testing planned in [facility]              Patient Active Problem List:     Adult body mass index 40 and over     Essential hypertension     Family history of malignant neoplasm of colon     History of total hysterectomy     History of cholecystectomy     Hyperlipidemia     Polycystic ovary syndrome     Uncontrolled type 2 diabetes mellitus     Vitamin D deficiency      Glucose intolerance     Obstructive sleep apnea     Type 2 diabetes mellitus without complication ([place])

## 2021-05-02 ENCOUNTER — Encounter

## 2021-05-02 MED ORDER — OZEMPIC (1 MG/DOSE) 4 MG/3ML SC SOPN
4 MG/3ML | SUBCUTANEOUS | 5 refills | Status: DC
Start: 2021-05-02 — End: 2021-05-05

## 2021-05-02 MED ORDER — LISINOPRIL 10 MG PO TABS
10 MG | ORAL_TABLET | ORAL | 1 refills | Status: DC
Start: 2021-05-02 — End: 2021-12-07

## 2021-05-02 NOTE — Telephone Encounter (Signed)
Last Visit Date: 02/24/2021   Next Visit Date: 05/04/2021

## 2021-05-04 ENCOUNTER — Encounter: Payer: BLUE CROSS/BLUE SHIELD | Attending: Family Medicine | Primary: Family Medicine

## 2021-05-04 LAB — TSH: TSH: 0.99 u[IU]/mL (ref 0.30–5.00)

## 2021-05-04 LAB — POCT GLYCOSYLATED HEMOGLOBIN (HGB A1C): Hemoglobin A1C: 6.7 %

## 2021-05-04 LAB — T4, FREE: Thyroxine, Free: 1.21 ng/dL (ref 0.93–1.70)

## 2021-05-04 NOTE — Progress Notes (Signed)
Subjective:     Patient ID: Catherine Forbes is a 51 y.o. female    HPI  Started small dose of Synthroid last visit    not much improved    Fatigued but thinks some of that might be the weather  Diabetes/ Glucose Intolerance  Following diet   LGI  No symptomatic HG spells  Using HBGM and diary  Compliant with meds  Weight change?  Stable  Side effects?  None  Hypertension  Compliant with taking blood pressure meds  Avoids caffeine, added salt, stimulants  Exercises on regular basis  Following low glycemic index  Denies chest pain, SOB, palpitations, headaches  OSA  Sees Dr Edison Nasuti  Hypothyroidism  Started Synthroid 50 mcg we will recheck her labs today        Review of Systems   Constitutional:  Positive for fatigue. Negative for activity change, appetite change and fever.   HENT:  Negative for sore throat.    Eyes:  Negative for visual disturbance.   Respiratory:  Negative for cough, chest tightness and shortness of breath.    Cardiovascular:  Negative for chest pain, palpitations and leg swelling.   Gastrointestinal:  Negative for abdominal pain, constipation and diarrhea.   Endocrine: Negative for cold intolerance.   Genitourinary:  Negative for dysuria and urgency.   Musculoskeletal:  Negative for back pain.   Neurological:  Negative for dizziness, syncope and headaches.   Hematological:  Does not bruise/bleed easily.   Psychiatric/Behavioral:  Negative for confusion and sleep disturbance. The patient is not nervous/anxious.          Objective:     Physical Exam  Constitutional:       Appearance: Normal appearance. She is obese.   HENT:      Head: Normocephalic.      Right Ear: External ear normal.      Left Ear: External ear normal.      Nose: Nose normal.      Mouth/Throat:      Mouth: Mucous membranes are moist.      Pharynx: Oropharynx is clear.   Eyes:      Conjunctiva/sclera: Conjunctivae normal.   Cardiovascular:      Rate and Rhythm: Normal rate and regular rhythm.      Pulses: Normal pulses.      Heart  sounds: Normal heart sounds. No murmur heard.  Pulmonary:      Effort: Pulmonary effort is normal.      Breath sounds: Normal breath sounds.   Musculoskeletal:         General: Normal range of motion.      Cervical back: Neck supple.      Right lower leg: No edema.      Left lower leg: No edema.   Lymphadenopathy:      Cervical: No cervical adenopathy.   Skin:     General: Skin is warm and dry.      Capillary Refill: Capillary refill takes less than 2 seconds.   Neurological:      General: No focal deficit present.      Mental Status: She is alert and oriented to person, place, and time.   Psychiatric:         Mood and Affect: Mood normal.         Behavior: Behavior normal.       Assessment/Plan:     1. Type 2 diabetes mellitus without complication, without long-term current use of insulin ([place])  2. Essential hypertension    3. Obstructive sleep apnea    4. Hypothyroidism, unspecified type    5. BMI 40.0-44.9, adult ([place])          Catherine Forbes was seen today for diabetes, hypertension and generalized body aches.    Diagnoses and all orders for this visit:    Type 2 diabetes mellitus without complication, without long-term current use of insulin (HCC)  -     POCT glycosylated hemoglobin (Hb A1C)  A1c is 6.7 today.  She just got her Ozempic refilled at 1 mg.  Will increase to 2 mg with her next fill because that will help with her weight loss as well  Essential hypertension  Continue same meds she is doing well avoid added salt and excess caffeine  Obstructive sleep apnea  Seems to be stable with current treatment  hypothyroidism, unspecified type  -     T4, Free; Future  -     TSH; Future  Continue Synthroid 50 mcg until we get the results back  BMI 40.0-44.9, adult (HCC)  Reinforced low glycemic index of the 10% weight loss over 3 months.  Perhaps increased dose of the Ozempic will help to drive the weight loss.  Patient verbalized understanding      Neta Mends, MD    Please note that this chart was generated  using voice recognition Dragon dictation software.  Although every effort was made to ensure the accuracy of this automated transcription, some errors in transcription may have occurred.

## 2021-05-05 ENCOUNTER — Encounter

## 2021-05-05 MED ORDER — SEMAGLUTIDE (2 MG/DOSE) 8 MG/3ML SC SOPN
83 MG/3ML | SUBCUTANEOUS | 5 refills | Status: AC
Start: 2021-05-05 — End: 2021-06-24

## 2021-05-11 NOTE — Telephone Encounter (Signed)
From: [name]  To: Dr. Neta Mends  Sent: 05/05/2021 12:09 PM EST  Subject: New thyroid medication     Hi Dr Naaman Plummer, I know we talked about upping my dosage of the thyroid medication yesterday at my appointment, did send that in to the pharmacy already?   Thanks,  [name]

## 2021-05-16 MED ORDER — LEVOTHYROXINE SODIUM 75 MCG PO TABS
75 MCG | ORAL_TABLET | Freq: Every day | ORAL | 1 refills | Status: DC
Start: 2021-05-16 — End: 2021-09-13

## 2021-08-08 ENCOUNTER — Inpatient Hospital Stay: Payer: PRIVATE HEALTH INSURANCE | Primary: Family Medicine

## 2021-08-08 ENCOUNTER — Encounter
Admit: 2021-08-08 | Discharge: 2021-08-08 | Payer: PRIVATE HEALTH INSURANCE | Attending: Family Medicine | Primary: Family Medicine

## 2021-08-08 ENCOUNTER — Ambulatory Visit: Admit: 2021-08-08 | Discharge: 2021-08-08 | Payer: PRIVATE HEALTH INSURANCE | Primary: Family Medicine

## 2021-08-08 DIAGNOSIS — E119 Type 2 diabetes mellitus without complications: Secondary | ICD-10-CM

## 2021-08-08 DIAGNOSIS — E559 Vitamin D deficiency, unspecified: Secondary | ICD-10-CM

## 2021-08-08 DIAGNOSIS — M542 Cervicalgia: Secondary | ICD-10-CM

## 2021-08-08 LAB — POCT GLYCOSYLATED HEMOGLOBIN (HGB A1C): Hemoglobin A1C: 6.5 %

## 2021-08-08 MED ORDER — METFORMIN HCL ER 500 MG PO TB24
500 MG | ORAL_TABLET | ORAL | 2 refills | Status: AC
Start: 2021-08-08 — End: 2021-12-07

## 2021-08-08 NOTE — Progress Notes (Signed)
Subjective:     Patient ID: Catherine Forbes is a 51 y.o. female    Diabetes  Pertinent negatives for hypoglycemia include no confusion, dizziness, headaches or nervousness/anxiousness. Associated symptoms include fatigue. Pertinent negatives for diabetes include no chest pain.   Hypertension  Associated symptoms include neck pain. Pertinent negatives include no chest pain, headaches, palpitations or shortness of breath.   Diabetes/ Glucose Intolerance  Following diet   LGI  No symptomatic HG spells  Using HBGM and diary  Compliant with meds  Weight change?  Not appreciable  Side effects?  None  Hypertension  Compliant with taking blood pressure meds  Avoids caffeine, added salt, stimulants  Exercises on regular basis  Following low glycemic index  Denies chest pain, SOB, palpitations, headaches     Hypothyroidism     increased dose after last draw to 75 mcg    Fatigue, joint aches, not sleeping    neck pain   neuralgia with tingling in fingers.  Remote history of lumbar RFA    Review of Systems   Constitutional:  Positive for fatigue. Negative for activity change, appetite change and fever.   HENT:  Negative for sore throat.    Eyes:  Negative for visual disturbance.   Respiratory:  Negative for cough, chest tightness and shortness of breath.    Cardiovascular:  Negative for chest pain, palpitations and leg swelling.   Gastrointestinal:  Negative for abdominal pain, constipation and diarrhea.   Endocrine: Negative for cold intolerance.   Genitourinary:  Negative for dysuria and urgency.   Musculoskeletal:  Positive for arthralgias, neck pain and neck stiffness. Negative for back pain.   Neurological:  Positive for numbness. Negative for dizziness, syncope and headaches.   Hematological:  Does not bruise/bleed easily.   Psychiatric/Behavioral:  Positive for sleep disturbance. Negative for confusion. The patient is not nervous/anxious.        Could not tolerate Turmeric or Mounjaro     CPAP OSA     needs adjustment    not sleeping  hot flashes    Objective:     Physical Exam  Constitutional:       General: She is not in acute distress.     Appearance: Normal appearance. She is obese. She is not ill-appearing.   HENT:      Head: Normocephalic.      Right Ear: External ear normal.      Left Ear: External ear normal.      Nose: Nose normal.      Mouth/Throat:      Mouth: Mucous membranes are moist.      Pharynx: Oropharynx is clear.   Eyes:      Conjunctiva/sclera: Conjunctivae normal.   Cardiovascular:      Rate and Rhythm: Normal rate and regular rhythm.      Pulses: Normal pulses.      Heart sounds: Normal heart sounds. No murmur heard.  Pulmonary:      Effort: Pulmonary effort is normal.      Breath sounds: Normal breath sounds.   Musculoskeletal:      Cervical back: Neck supple.      Right lower leg: No edema.      Left lower leg: No edema.      Comments: Examination of the neck reveals that she has decreased range of motion for flexion extension and rotation.  Also has some point tenderness over C5-6.  Shoulder shrug is okay biceps strength is bilaterally equal hand grasp finger  spread finger extension all normal.   Lymphadenopathy:      Cervical: No cervical adenopathy.   Skin:     General: Skin is warm and dry.      Capillary Refill: Capillary refill takes less than 2 seconds.   Neurological:      General: No focal deficit present.      Mental Status: She is alert and oriented to person, place, and time.   Psychiatric:         Mood and Affect: Mood normal.         Behavior: Behavior normal.       Assessment/Plan:     1. Type 2 diabetes mellitus without complication, without long-term current use of insulin ([place])    2. Essential hypertension    3. Hypothyroidism, unspecified type    4. BMI 40.0-44.9, adult (HCC)    5. Leukocytosis, unspecified type    6. Neck pain    7. Obstructive sleep apnea    8. Fatigue, unspecified type    9. Vitamin D deficiency          Pa was seen today for diabetes, hypertension and  hypothyroidism.    Diagnoses and all orders for this visit:    Type 2 diabetes mellitus without complication, without long-term current use of insulin (HCC)  -     POCT glycosylated hemoglobin (Hb A1C)  A1c today is 6.5  Continue Ozempic at maximum dose 2 mg weekly  Recommend for longevity reasons that we restart metformin XR 500 mg daily.  We did discontinue it in the past because of her difficulties with diarrhea.  However she thinks that the symptoms were not related to the metformin at all and repeat prefer to try it again  Essential hypertension  Good control with lisinopril 10 mg daily and spironolactone hydrochlorothiazide 25/25 daily  Hypothyroidism, unspecified type  -     T4, Free; Future  Continue levothyroxine 75 mcg  BMI 40.0-44.9, adult (HCC)  Reinforced low glycemic index carbohydrate to fiber ratio less than ten to 1-calorie counts.  Etc.  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  She has a family history of autoimmune disease.  We will recheck the CBC is sedimentation rate as a screen and also salivary cortisol because of her chronic fatigue  Neck pain  -     XR CERVICAL SPINE (2-3 VIEWS); Future  Concern about DJD versus disc  Obstructive sleep apnea  Needs a follow-up with Dr. Edison Nasuti about adjustment of her equipment  Fatigue, unspecified type  -     CBC with Auto Differential; Future  -     Salivary Cortisol; Future    Vitamin D deficiency  -     Vitamin D 25 Hydroxy; Future    Other orders  -     metFORMIN (GLUCOPHAGE-XR) 500 MG extended release tablet; Take one with breakfast and supper          Neta Mends, MD    Please note that this chart was generated using voice recognition Dragon dictation software.  Although every effort was made to ensure the accuracy of this automated transcription, some errors in transcription may have  occurred.

## 2021-08-08 NOTE — Other (Signed)
Essentially within normal limits  will discuss in detail at next visit

## 2021-08-09 ENCOUNTER — Inpatient Hospital Stay: Payer: PRIVATE HEALTH INSURANCE | Primary: Family Medicine

## 2021-08-09 DIAGNOSIS — D72829 Elevated white blood cell count, unspecified: Secondary | ICD-10-CM

## 2021-08-09 LAB — CBC WITH AUTO DIFFERENTIAL
Absolute Eos #: 0.26 10*3/uL (ref 0.00–0.44)
Absolute Immature Granulocyte: 0.04 10*3/uL (ref 0.00–0.30)
Absolute Lymph #: 2.18 10*3/uL (ref 1.10–3.70)
Absolute Mono #: 0.74 10*3/uL (ref 0.10–1.20)
Basophils Absolute: 0.1 10*3/uL (ref 0.00–0.20)
Basophils: 1 % (ref 0–2)
Eosinophils %: 2 % (ref 1–4)
Hematocrit: 46 % (ref 36.3–47.1)
Hemoglobin: 15.2 g/dL — ABNORMAL HIGH (ref 11.9–15.1)
Immature Granulocytes: 0 %
Lymphocytes: 19 % — ABNORMAL LOW (ref 24–43)
MCH: 31.1 pg (ref 25.2–33.5)
MCHC: 33 g/dL (ref 28.4–34.8)
MCV: 94.1 fL (ref 82.6–102.9)
MPV: 9.7 fL (ref 8.1–13.5)
Monocytes: 7 % (ref 3–12)
NRBC Automated: 0 per 100 WBC
Platelets: 369 10*3/uL (ref 138–453)
RBC: 4.89 m/uL (ref 3.95–5.11)
RDW: 12.5 % (ref 11.8–14.4)
Seg Neutrophils: 71 % — ABNORMAL HIGH (ref 36–65)
Segs Absolute: 8.14 10*3/uL — ABNORMAL HIGH (ref 1.50–8.10)
WBC: 11.5 10*3/uL — ABNORMAL HIGH (ref 3.5–11.3)

## 2021-08-09 LAB — VITAMIN D 25 HYDROXY: Vit D, 25-Hydroxy: 58.9 ng/mL (ref >29.9)

## 2021-08-09 LAB — SEDIMENTATION RATE: Sed Rate: 9 mm/Hr (ref 0–30)

## 2021-08-09 LAB — T4, FREE: Thyroxine, Free: 1.7 ng/dL (ref 0.9–1.7)

## 2021-08-11 ENCOUNTER — Encounter

## 2021-08-11 NOTE — Other (Signed)
Notable DJD because of her neuropathy she will need an MRI of the cervical spine

## 2021-08-12 NOTE — Telephone Encounter (Signed)
-----   Message from [place], [place] sent at 08/10/2021  2:23 PM EDT -----    ----- Message -----  From: Neta Mends, MD  Sent: 08/08/2021   9:04 PM EDT  To: Flavia Shipper, MA    She needs a follow-up with Dr. Edison Nasuti in regard to adjusting her equipment for obstructive sleep apnea.  Just not sleeping well.  Please arrange

## 2021-08-12 NOTE — Telephone Encounter (Signed)
Called patient she states she doing fine. If she needs adjustment for her OSA she will reach out to Dr Edison Nasuti .

## 2021-08-15 LAB — SALIVARY CORTISOL: Cortisol, Saliva: 0.068 ug/dL

## 2021-08-29 ENCOUNTER — Ambulatory Visit
Admit: 2021-08-29 | Discharge: 2021-08-29 | Payer: PRIVATE HEALTH INSURANCE | Attending: Family Medicine | Primary: Family Medicine

## 2021-08-29 DIAGNOSIS — M5412 Radiculopathy, cervical region: Secondary | ICD-10-CM

## 2021-08-29 NOTE — Progress Notes (Unsigned)
Subjective:     Patient ID: Catherine Forbes is a 51 y.o. female    HPI        Review of Systems        Objective:     Physical Exam    Assessment/Plan:     No diagnosis found.      There are no diagnoses linked to this encounter.      Marlou Starks, MD    Please note that this chart was generated using voice recognition Dragon dictation software.  Although every effort was made to ensure the accuracy of this automated transcription, some errors in transcription may have occurred.

## 2021-09-02 ENCOUNTER — Telehealth

## 2021-09-02 NOTE — Telephone Encounter (Signed)
[facility] calling regarding MRI order. Requesting order to be changed to MRI w/o contrast.

## 2021-09-06 ENCOUNTER — Inpatient Hospital Stay: Admit: 2021-09-06 | Payer: PRIVATE HEALTH INSURANCE | Primary: Family Medicine

## 2021-09-06 DIAGNOSIS — M542 Cervicalgia: Secondary | ICD-10-CM

## 2021-09-06 NOTE — Other (Signed)
Should have a neurosurgical opinion and or pain management.  Please notify patient if she wants to return to office to discuss in more depth concernedly that

## 2021-09-13 ENCOUNTER — Encounter

## 2021-09-13 MED ORDER — ROSUVASTATIN CALCIUM 10 MG PO TABS
10 MG | ORAL_TABLET | ORAL | 2 refills | Status: AC
Start: 2021-09-13 — End: 2021-12-12

## 2021-09-13 MED ORDER — LEVOTHYROXINE SODIUM 75 MCG PO TABS
75 MCG | ORAL_TABLET | ORAL | 1 refills | Status: DC
Start: 2021-09-13 — End: 2021-12-07

## 2021-09-13 NOTE — Telephone Encounter (Signed)
Last Visit Date: 08/29/2021  Next Visit Date: 12/07/2021

## 2021-09-13 NOTE — Telephone Encounter (Signed)
Last Visit Date: 08/29/2021   Next Visit Date: 12/07/2021

## 2021-09-20 MED ORDER — SPIRONOLACTONE-HCTZ 25-25 MG PO TABS
25-25 MG | ORAL_TABLET | ORAL | 1 refills | Status: DC
Start: 2021-09-20 — End: 2021-12-07

## 2021-09-20 NOTE — Telephone Encounter (Signed)
Catherine Forbes is requesting a refill on the following medication(s):  Requested Prescriptions     Pending Prescriptions Disp Refills    spironolactone-hydroCHLOROthiazide (ALDACTAZIDE) 25-25 MG per tablet [Pharmacy Med Name: SPIRONOLACTONE-HCTZ 25-25 TAB] 90 tablet 1     Sig: TAKE 1 TABLET BY MOUTH EVERY DAY     Refused Prescriptions Disp Refills    metFORMIN (GLUCOPHAGE-XR) 500 MG extended release tablet [Pharmacy Med Name: METFORMIN HCL ER 500 MG TABLET] 180 tablet      Sig: TAKE 1 TABLET BY MOUTH WITH BREAKFAST AND SUPPER       Last Visit Date (If Applicable):  12/28/452    Next Visit Date:    12/07/2021

## 2021-12-07 ENCOUNTER — Ambulatory Visit
Admit: 2021-12-07 | Discharge: 2021-12-07 | Payer: PRIVATE HEALTH INSURANCE | Attending: Family Medicine | Primary: Family Medicine

## 2021-12-07 DIAGNOSIS — E119 Type 2 diabetes mellitus without complications: Secondary | ICD-10-CM

## 2021-12-07 LAB — POCT GLYCOSYLATED HEMOGLOBIN (HGB A1C): Hemoglobin A1C: 6.2 %

## 2021-12-07 MED ORDER — METFORMIN HCL ER 500 MG PO TB24
500 MG | ORAL_TABLET | ORAL | 2 refills | Status: AC
Start: 2021-12-07 — End: 2022-03-14

## 2021-12-07 MED ORDER — SPIRONOLACTONE-HCTZ 25-25 MG PO TABS
25-25 MG | ORAL_TABLET | Freq: Every day | ORAL | 1 refills | Status: AC
Start: 2021-12-07 — End: 2022-06-05

## 2021-12-07 MED ORDER — LISINOPRIL 10 MG PO TABS
10 MG | ORAL_TABLET | Freq: Every day | ORAL | 1 refills | Status: AC
Start: 2021-12-07 — End: 2022-06-05

## 2021-12-07 MED ORDER — ROSUVASTATIN CALCIUM 10 MG PO TABS
10 MG | ORAL_TABLET | ORAL | 1 refills | Status: AC
Start: 2021-12-07 — End: 2022-10-13

## 2021-12-07 MED ORDER — LEVOTHYROXINE SODIUM 75 MCG PO TABS
75 MCG | ORAL_TABLET | Freq: Every day | ORAL | 1 refills | Status: AC
Start: 2021-12-07 — End: 2022-06-05

## 2021-12-07 NOTE — Patient Instructions (Signed)
Add alpha lipoic acid 300 mg 1-2 tabs  2 or 3 times daily to help with neuropathy    Add Coenzyme Q10 300 mg daily    DHA fish oil 1000 mg daily  NOW brand     Refer to neurosurgery    [facility] protocol
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
## 2022-01-04 ENCOUNTER — Encounter
Admit: 2022-01-04 | Discharge: 2022-01-04 | Payer: PRIVATE HEALTH INSURANCE | Attending: Neurological Surgery | Primary: Family Medicine

## 2022-02-20 ENCOUNTER — Ambulatory Visit
Admit: 2022-02-20 | Discharge: 2022-02-20 | Payer: PRIVATE HEALTH INSURANCE | Attending: Surgical | Primary: Family Medicine

## 2022-02-20 DIAGNOSIS — G5603 Carpal tunnel syndrome, bilateral upper limbs: Secondary | ICD-10-CM

## 2022-02-20 NOTE — Progress Notes (Addendum)
[facility]  [address], [place]  [place] [id number]  Dept: [phone]  Dept Fax: [phone]     Patient:  Catherine Forbes  Date of Birth: [birth year]  Date: 02/20/22      Chief Complaint   Patient presents with    Follow-up     AFTER EMG           HPI:       I had the pleasure of seeing this patient in the office today in follow-up.  As you know she is a 51 year old female who was last seen in our office September 13 with a 3-year history of numbness and tingling in her hands.  She notices this discomfort at night as well as during the day and states she does have to shake her hands trying to ease the discomfort.  She states the symptoms in her right hand are worse than that in her left.   cervical MRI reviewed at her last visit demonstrated multilevel degenerative disc disease with associated broad-based disc/osteophytic complex from the C4-C5 through C6-C7 level, these changes resulted in mild to early moderate central stenosis as well as a degree of adjacent foraminal stenosis most notable at the C4-C5 level on the right.  No evidence of   Spinal cord compression was identified.  We did proceed with EMG nerve conduction studies of the bilateral upper extremities, she presents today to review these findings in the office.  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  Patient states she has  tried conservative treatment with the wrist braces which have led to no significant improvement.   she states her symptoms are present on a daily basis and quite debilitating to her.  Further treatment options were discussed conservative measures versus surgical intervention. Conservative measures discussed included medications, physical therapy, and/or pain management for epidural steroid injections. All of the above  treatment options were discussed. The option of surgery was discussed, as well. Surgery would take the form of right carpal tunnel release. The details and risks of surgery were thoroughly reviewed. The potential risks of surgery discussed included the risk of bleeding, infection, injury to a nerve resulting in weakness or paralysis, no change or worsening of symptoms, recurrence of symptoms requiring further surgical intervention, risk of anesthesia and/or death. All the above information was discussed at length as well as any remaining questions answered. The patient clearly understands there is no mandate undergo surgery. The patient also fully understands surgery would not completely resolve their symptoms. Lastly, I took this opportunity to answer any remaining questions the patient may have had regarding this matter. Again, from a surgeon's perspective, the patient does have a clinical presentation as well as radiographic findings quite consistent with their level of discomfort and I do feel surgical intervention would be quite beneficial to them at this point time.  Dr. Bernerd Limbo did come into the room and review my full history and examination. He was involved in both the assessment and treatment plan for this patient today in the office. I did see the patient in collaboration with him today in the office.    Physical Exam:            Assessment and Plan:   No diagnosis found.          Electronically signed by Zerita Boers, PA  on 02/20/2022 at 1:25 PM    Please note that this chart was generated using voice recognition Dragon dictation software.  Although every effort was made to ensure the accuracy of this automated transcription, some errors in transcription may have occurred.

## 2022-02-24 NOTE — Discharge Instructions (Addendum)
Pre-operative Instructions           NOTHING to eat or drink after midnight the night prior to surgery   (This includes gum, candy, mints, chewing tobacco, etc).    Please arrive at the surgery center (Entrance B) by 11:00 AM on 03/14/2022 (or as directed by your surgeon's office). See Directons to Surgery Center below.      Please take only the following medication(s) the day of surgery with a small sip of water: Levothyroxine (Synthroid)    Hold Lisinoprol (Prinivil) morning of surgery.    Please stop any blood thinning medications  AS DIRECTED BY PRESCRIBING PROVIDER! :   Failure to stop these medications as instructed (too soon or too late) may result in injury to you, or your surgery may need to be rescheduled. Below is a list of some examples for your reference.     Antiplatelets : (stop blood cells (called platelets) from sticking together and forming a blood clot):   Aspirin (Bufferin, Ecotrin), Clopidogrel (Plavix), Ticagrelor (Brilinta), Prasugrel (Effient), Dipyridamole/aspirin (Aggrenox), Ticlopidine (Ticlid), Eptifibatide (Integrilin)    Anticoagulants: (slow down your body's process of making clots):   Warfarin (Coumadin), Rivaroxaban (Xarelto), Dabigatran (Pradaxa), Apixaban (Eliquis), Edoxaban (Savaysa), Heparin/Enoxaparin (Lovenox), Fondaparinux (Arixtra)    NSAIDS: Aspirin (Bufferin, Ecotrin), Ibuprofen (Motrin, Nuprin,Advil), Naproxen (Aleve),Meloxicam (Mobic), Celecoxib (Celebrex), Diclofenac (Voltaren), Etodolac (Lodine), Indomethacin, Ketorolac, Nabumetone, Oxaprozin (Daypro), Piroxicam (Feldene), Excedrin (has aspirin in it)    Herbals/supplements: Bromelain, Cinnamon, [employer], [place] (female ginseng), Fish oil, Garlic, Ginger,Ginkgo biloba, Grape seed extract, Turmeric, Vitamin E, etc...[name])    - Hold Ozempic one week prior to surgery and discuss this with prescribing provider.     You may continue the rest of your medications through the night before surgery unless instructed  otherwise.    If applicable:   -Do not take diabetic medications on the day of surgery.  -Please use/bring daily inhalers with you     PLEASE NOTE:  THE ABOVE (IF ANY) DISCONTINUED MEDS MAY ONLY BE FROM   "CLEANING UP" THE MED LIST AND WERE NOT ACTUALLY CANCELLED; SEE CHART FOR DETAILS AND ALWAYS CHECK WITH PRESCRIBING PROVIDER BEFORE DISCONTINUING ANY MEDICATIONS          ___________________  _______________________  Signature (Provider)              Signature (Patient)         Day of Surgery/Procedure    As a patient at [facility] you can expect quality medical and nursing care that is centered on your individual needs.  Our goal is to make your surgical experience as comfortable as possible    Directions to the [place] at [facility]. [facility] is located in the Emergency Room parking lot on [address] or there is additional parking across the street. The address is [place] [id number]. Please check in at the [place] upon arrival.     Patient Instructions  In case of any illness please contact your surgeons office for instructions prior to coming to the hospital.    Due to current restrictions you are only allowed 2 adults to be with you. Masks are to be worn and screening will take place on arrival.     Bring your current list of medications, vitamins, herbals and anything you might take on an  "as needed " basis.  It is important we have a correct list with dosages and frequencies. Please verify your  list with your medications at home or bring all of your bottles with you.    If you have been given a blood band be sure to bring it with you on the day of surgery. Do not put it on or close the clasp.    Use and bring any inhalers if you are currently using one.    It is ok to brush your teeth but do not swallow any water.    You may be required to provide a urine sample upon your arrival to the pre-op area, so please take this into consideration  prior to using the restroom.    No jewelry or piercing's to be worn into surgery because you might be injured because of them.    No contact lenses to be worn.  It is ok to wear your glasses in pre op but they will be removed prior to going into the operating room.    Dentures/partials will likely need to be removed in pre op depending on your type of anesthesia, please do not use adhesives on the day of surgery.    Bathe as instructed with the special soap given to you.  No lotions, powders or creams after bathing.    Wear loose comfortable clothing / shoes that are easy to get on and off over wounds or casts.    If you are going to be admitted after surgery please bring your Cpap or BiPap if you have it at home.    Keep patient belongings to a minimum and leave valuables at home. If you are staying overnight with [place], please bring a SMALL bag of personal items.  We cannot accommodate large items, like suitcases.    If you are going home after anesthesia or sedation then you must have a responsible adult with you to take you home and to be with you for the first 24 hours after surgery. If you do not have someone to stay with you your surgery might get cancelled.  Please contact your surgeon's office to see if other arrangements can be made if you can not find someone to stay with you.    If you have any other questions on the day of surgery please contact [phone] or [phone]    If you have any other questions regarding your procedure/surgery please call  your surgeon's office.

## 2022-03-01 ENCOUNTER — Inpatient Hospital Stay: Admit: 2022-03-01 | Discharge: 2022-03-08 | Payer: PRIVATE HEALTH INSURANCE | Primary: Family Medicine

## 2022-03-01 LAB — GLUCOSE, RANDOM: Glucose: 177 mg/dL — ABNORMAL HIGH (ref 74–99)

## 2022-03-01 LAB — CBC
Hematocrit: 43.7 % (ref 36.3–47.1)
Hemoglobin: 14.7 g/dL (ref 11.9–15.1)
MCH: 31.4 pg (ref 25.2–33.5)
MCHC: 33.6 g/dL (ref 28.4–34.8)
MCV: 93.4 fL (ref 82.6–102.9)
MPV: 9.3 fL (ref 8.1–13.5)
NRBC Automated: 0 per 100 WBC
Platelets: 308 10*3/uL (ref 138–453)
RBC: 4.68 m/uL (ref 3.95–5.11)
RDW: 12.3 % (ref 11.8–14.4)
WBC: 9.4 10*3/uL (ref 3.5–11.3)

## 2022-03-01 LAB — ELECTROLYTE PANEL
Anion Gap: 11 mmol/L (ref 9–16)
CO2: 28 mmol/L (ref 20–31)
Chloride: 99 mmol/L (ref 98–107)
Potassium: 4.3 mmol/L (ref 3.7–5.3)
Sodium: 138 mmol/L (ref 136–145)

## 2022-03-01 LAB — BUN & CREATININE
BUN: 10 mg/dL (ref 6–20)
Creatinine: 0.7 mg/dL (ref 0.50–0.90)
Est, Glom Filt Rate: >60 mL/min/{1.73_m2} (ref >60)
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
## 2022-03-01 NOTE — Progress Notes (Incomplete)
Anesthesia Focused Assessment  Upcoming surgery:   03/14/2022 CARPAL TUNNEL RELEASE - Right Hoeflinger, Arlys John, MD     Hx of anesthesia complications:  ***  Family hx of anesthesia complications:  ***    METS functional capacity:   -Poor <4 ***  -Moderate/Excellent >4 ***    + Covid-19 test in last 8 weeks? ***  Symptoms/Hospitalization?:***    STOP-BANG Sleep Apnea Questionnaire    SNORE loudly (heard through closed doors)?   {YES / GG:26948}  TIRED, fatigued, sleepy during daytime?    {YES / NI:62703}  OBSERVED stopping breathing during sleep?   {YES / JK:09381}  High blood PRESSURE or being treated?    {YES / NO:19727}    BMI over 35?        {YES / NO:19727}  AGE over 50?        {YES / WE:99371}  NECK circumference over 16"?     {YES / IR:67893}  GENDER (female)?       {YES / YB:01751}             Total ***  High risk 5-8  Intermediate risk 3-4  Low risk 0-2    ----------------------------------------------------------------------------------------------------------------------  OSA:                                             ***  If yes, machine?:                          ***    Type 1 DM:                                   ***  T2DM:                                           ***    Coronary Artery Disease:             ***  Hypertension:                               ***  Defib / AICD / Pacemaker:           ***    Renal Failure:                               ***  If yes, on dialysis?:                       ***    Active smoker:                              ***  Drinks Alcohol:                              ***  Illicit drugs:                                    ***  Dentition:                                      ***    Past Medical History:   Diagnosis Date    Glucose intolerance     History of vitamin D deficiency     Hyperlipidemia     Hypertension        Patient was evaluated in PAT & anesthesia guidelines were applied.     NPO guidelines, medication instructions and scheduled arrival time ***were reviewed with  patient. ***    I advised patient/patient family to please contact surgeons office, ahead of time if possible, if any new signs or symptoms of illness, infection, rash, etc. Patient/ patient family verbalize understanding.     Patient was sent for post PAT anesthesia interview for covid + history ***                                                                                                                     Anesthesia contacted:   ***  I spoke with Dr. [name] Patient history and pertinent findings reviewed (as documented above)***. Instructed to obtain *** clearance from ***  Medical or cardiac clearance ordered: ***    Kelly Ranieri, APRN - CNP  Electronically signed 02/28/2022 at 1:52 PM

## 2022-03-03 DIAGNOSIS — Z01818 Encounter for other preprocedural examination: Secondary | ICD-10-CM

## 2022-03-03 LAB — EKG 12-LEAD
Atrial Rate: 91 {beats}/min
P Axis: 18 degrees
P-R Interval: 144 ms
Q-T Interval: 364 ms
QRS Duration: 82 ms
QTc Calculation (Bazett): 447 ms
R Axis: 57 degrees
T Axis: 6 degrees
Ventricular Rate: 91 {beats}/min

## 2022-03-06 ENCOUNTER — Inpatient Hospital Stay: Payer: PRIVATE HEALTH INSURANCE | Primary: Family Medicine

## 2022-03-06 ENCOUNTER — Ambulatory Visit
Admit: 2022-03-06 | Discharge: 2022-03-06 | Payer: PRIVATE HEALTH INSURANCE | Attending: Family Medicine | Primary: Family Medicine

## 2022-03-06 DIAGNOSIS — E119 Type 2 diabetes mellitus without complications: Secondary | ICD-10-CM

## 2022-03-06 LAB — POCT GLYCOSYLATED HEMOGLOBIN (HGB A1C): Hemoglobin A1C: 7.7 %

## 2022-03-06 MED ORDER — OZEMPIC (1 MG/DOSE) 4 MG/3ML SC SOPN
4 MG/3ML | SUBCUTANEOUS | 3 refills | Status: AC
Start: 2022-03-06 — End: 2022-07-13

## 2022-03-06 MED ORDER — EMPAGLIFLOZIN 10 MG PO TABS
10 MG | ORAL_TABLET | Freq: Every day | ORAL | 5 refills | Status: DC
Start: 2022-03-06 — End: 2022-03-14

## 2022-03-06 NOTE — Progress Notes (Addendum)
Subjective:     Patient ID: Catherine Forbes is a 51 y.o. female    HPI Catherine Forbes returns today for follow-up on her diabetes hypertension hyperlipidemia left knee pain.  She has plans to have carpal tunnel surgery done next week.  Difficulty tolerating the Ozempic  has been off for 3 weeks   wants to go back to 1 mg weekly  Increase Metformin to 500 mg twice daily  On [place] from sleep doctor Dr Edison Nasuti    Add Jardiance 10 mg for 1 month, if tolerates increase to 25 mg daily  Will need to check bmp in 1 month  also Free T4   Hot flashes continue    L knee pain with no known injury    She was tried on Mounjaro in the past and could not tolerate it    Review of Systems   Constitutional:  Negative for activity change, appetite change, fatigue and fever.   HENT:  Negative for sore throat.    Eyes:  Negative for visual disturbance.   Respiratory:  Negative for cough, chest tightness and shortness of breath.    Cardiovascular:  Negative for chest pain, palpitations and leg swelling.   Gastrointestinal:  Negative for abdominal pain, constipation and diarrhea.   Endocrine: Positive for heat intolerance. Negative for cold intolerance.   Genitourinary:  Negative for dysuria and urgency.   Musculoskeletal:  Positive for arthralgias. Negative for back pain.   Neurological:  Negative for dizziness, syncope and headaches.   Hematological:  Does not bruise/bleed easily.   Psychiatric/Behavioral:  Negative for confusion and sleep disturbance. The patient is not nervous/anxious.            Objective:     Physical Exam  Vitals and nursing note reviewed.   Constitutional:       Appearance: Normal appearance.   HENT:      Head: Normocephalic.      Right Ear: External ear normal.      Left Ear: External ear normal.      Nose: Nose normal.      Mouth/Throat:      Mouth: Mucous membranes are moist.      Pharynx: Oropharynx is clear.   Eyes:      Conjunctiva/sclera: Conjunctivae normal.   Cardiovascular:      Rate and Rhythm: Normal rate and  regular rhythm.      Pulses: Normal pulses.      Heart sounds: Normal heart sounds. No murmur heard.  Pulmonary:      Effort: Pulmonary effort is normal.      Breath sounds: Normal breath sounds.   Musculoskeletal:      Cervical back: Neck supple.      Right lower leg: No edema.      Left lower leg: No edema.      Comments: Left knee exam reveals no effusion or joint tenderness or deformity.  However on movement of her patella she has crepitus consistent with patellofemoral syndrome.  Recommend that she wear a knee sleeve with a hole ice knee exercises   Lymphadenopathy:      Cervical: No cervical adenopathy.   Skin:     General: Skin is warm and dry.      Capillary Refill: Capillary refill takes less than 2 seconds.   Neurological:      General: No focal deficit present.      Mental Status: She is alert and oriented to person, place, and time.   Psychiatric:  Mood and Affect: Mood normal.         Behavior: Behavior normal.         Assessment/Plan:     1. Controlled type 2 diabetes mellitus without complication, without long-term current use of insulin ([place])    2. Essential hypertension    3. BMI 40.0-44.9, adult ([place])    4. Obstructive sleep apnea    5. Patellofemoral pain syndrome of left knee          Catherine Forbes was seen today for diabetes, hypertension, cholesterol problem, knee pain and carpal tunnel.    Diagnoses and all orders for this visit:    Controlled type 2 diabetes mellitus without complication, without long-term current use of insulin (H[facility])  -     POCT glycosylated hemoglobin (Hb A1C)  -     Microalbumin, Ur; Future  -     Basic Metabolic Panel; Future  We will increase her metformin to twice a day if she can tolerate it with her bowel.  She wants to back down on the Ozempic to 1 mg weekly.  We will add Jardiance 10 mg.  We discussed potential side effects.  After she has been on the Jardiance for a month we will check her BMP for check on her BUN and creatinine.  If tolerating will advance to 25  mg  Need to focus on cardiovascular risk reduction.  She also needs to have her lipids redone  Essential hypertension  -     Basic Metabolic Panel; Future  Continue lisinopril 10 mg daily spironolactone/hydrochlorothiazide  BMI 40.0-44.9, adult (HCC)  Again reinforced low glycemic index high-fiber diet and calorie counts  Obstructive sleep apnea  Needs to wear her CPAP  Patellofemoral pain syndrome of left knee  Ice knee sleeve exercise  Other orders  -     empagliflozin (JARDIANCE) 10 MG tablet; Take 1 tablet by mouth daily    Recommend that she not take the Ozempic a week before her surgery because of the delayed gastric emptying and increased risk of aspiration.  She verbalized understanding      Neta Mends, MD    Please note that this chart was generated using voice recognition Dragon dictation software.  Although every effort was made to ensure the accuracy of this automated transcription, some errors in transcription may have occurred.

## 2022-03-06 NOTE — Addendum Note (Signed)
Addended by: Shelton Silvas on: 03/06/2022 08:12 PM     Modules accepted: Orders

## 2022-03-06 NOTE — Patient Instructions (Addendum)
Hold Ozempic until after surgery    Increase Metformin 500 to twice     Add Jardiance 10 mg daily  for 1 month then check labs and increase dose    Needs lipids done with her next blood work

## 2022-03-07 LAB — MICROALBUMIN, UR
Creatinine, Ur: 101.4 mg/dL (ref 28.0–217.0)
Microalb, Ur: <12 mg/L (ref <21)

## 2022-03-14 ENCOUNTER — Inpatient Hospital Stay: Payer: PRIVATE HEALTH INSURANCE | Attending: Neurological Surgery

## 2022-03-14 ENCOUNTER — Encounter: Payer: PRIVATE HEALTH INSURANCE | Attending: Family Medicine | Primary: Family Medicine

## 2022-03-14 LAB — POTASSIUM (POC): POC Potassium: 3.8 mmol/L (ref 3.5–4.5)

## 2022-03-14 LAB — POCT GLUCOSE: POC Glucose: 163 mg/dL — ABNORMAL HIGH (ref 74–100)

## 2022-03-14 LAB — POC GLUCOSE FINGERSTICK: POC Glucose: 130 mg/dL — ABNORMAL HIGH (ref 65–105)

## 2022-03-14 MED ORDER — CEPHALEXIN 250 MG PO CAPS
250 MG | ORAL_CAPSULE | Freq: Four times a day (QID) | ORAL | 0 refills | Status: AC
Start: 2022-03-14 — End: 2022-03-19

## 2022-03-14 MED ORDER — FENTANYL CITRATE (PF) 100 MCG/2ML IJ SOLN
100 MCG/2ML | INTRAMUSCULAR | Status: DC | PRN
Start: 2022-03-14 — End: 2022-03-14
  Administered 2022-03-14: 16:00:00 50 via INTRAVENOUS

## 2022-03-14 MED ORDER — FENTANYL CITRATE (PF) 100 MCG/2ML IJ SOLN
100 MCG/2ML | INTRAMUSCULAR | Status: DC | PRN
Start: 2022-03-14 — End: 2022-03-14

## 2022-03-14 MED ORDER — LIDOCAINE-EPINEPHRINE 1 %-1:100000 IJ SOLN
1 %-:00000 | INTRAMUSCULAR | Status: DC | PRN
Start: 2022-03-14 — End: 2022-03-14
  Administered 2022-03-14: 17:00:00 5 via INTRADERMAL

## 2022-03-14 MED ORDER — NORMAL SALINE FLUSH 0.9 % IV SOLN
0.9 % | Freq: Two times a day (BID) | INTRAVENOUS | Status: DC
Start: 2022-03-14 — End: 2022-03-14

## 2022-03-14 MED ORDER — MIDAZOLAM HCL 2 MG/2ML IJ SOLN
2 MG/ML | INTRAMUSCULAR | Status: AC
Start: 2022-03-14 — End: ?

## 2022-03-14 MED ORDER — FENTANYL CITRATE (PF) 100 MCG/2ML IJ SOLN
100 MCG/2ML | INTRAMUSCULAR | Status: AC
Start: 2022-03-14 — End: ?

## 2022-03-14 MED ORDER — CEFAZOLIN 2000 MG IN 20 ML SWFI IV SYRINGE (PREMIX)
Status: DC | PRN
Start: 2022-03-14 — End: 2022-03-14
  Administered 2022-03-14: 16:00:00 2 via INTRAVENOUS

## 2022-03-14 MED ORDER — LIDOCAINE HCL (PF) 1 % IJ SOLN
1 % | INTRAMUSCULAR | Status: AC
Start: 2022-03-14 — End: ?

## 2022-03-14 MED ORDER — PROPOFOL 200 MG/20ML IV EMUL
200 MG/20ML | INTRAVENOUS | Status: DC | PRN
Start: 2022-03-14 — End: 2022-03-14
  Administered 2022-03-14: 16:00:00 50 via INTRAVENOUS
  Administered 2022-03-14: 16:00:00 125 via INTRAVENOUS

## 2022-03-14 MED ORDER — ONDANSETRON HCL 4 MG/2ML IJ SOLN
4 MG/2ML | INTRAMUSCULAR | Status: AC
Start: 2022-03-14 — End: ?

## 2022-03-14 MED ORDER — ONDANSETRON HCL 4 MG/2ML IJ SOLN
4 MG/2ML | INTRAMUSCULAR | Status: DC | PRN
Start: 2022-03-14 — End: 2022-03-14
  Administered 2022-03-14: 17:00:00 4 via INTRAVENOUS

## 2022-03-14 MED ORDER — OXYCODONE-ACETAMINOPHEN 5-325 MG PO TABS
5-325 MG | ORAL_TABLET | ORAL | 0 refills | Status: AC | PRN
Start: 2022-03-14 — End: 2022-03-21

## 2022-03-14 MED ORDER — MIDAZOLAM HCL 2 MG/2ML IJ SOLN
2 MG/ML | INTRAMUSCULAR | Status: DC | PRN
Start: 2022-03-14 — End: 2022-03-14
  Administered 2022-03-14: 16:00:00 2 via INTRAVENOUS

## 2022-03-14 MED ORDER — SODIUM CHLORIDE 0.9 % IV SOLN
0.9 % | INTRAVENOUS | Status: DC | PRN
Start: 2022-03-14 — End: 2022-03-14

## 2022-03-14 MED ORDER — LACTATED RINGERS IV SOLN
INTRAVENOUS | Status: DC
Start: 2022-03-14 — End: 2022-03-14
  Administered 2022-03-14: 15:00:00 via INTRAVENOUS

## 2022-03-14 MED ORDER — NORMAL SALINE FLUSH 0.9 % IV SOLN
0.9 % | INTRAVENOUS | Status: DC | PRN
Start: 2022-03-14 — End: 2022-03-14

## 2022-03-14 MED FILL — LIDOCAINE HCL (PF) 1 % IJ SOLN: 1 % | INTRAMUSCULAR | Qty: 5

## 2022-03-14 MED FILL — MIDAZOLAM HCL 2 MG/2ML IJ SOLN: 2 MG/ML | INTRAMUSCULAR | Qty: 2

## 2022-03-14 MED FILL — ONDANSETRON HCL 4 MG/2ML IJ SOLN: 4 MG/2ML | INTRAMUSCULAR | Qty: 2

## 2022-03-14 MED FILL — FENTANYL CITRATE (PF) 100 MCG/2ML IJ SOLN: 100 MCG/2ML | INTRAMUSCULAR | Qty: 2

## 2022-03-14 NOTE — Anesthesia Pre-Procedure Evaluation (Signed)
Department of Anesthesiology  Preprocedure Note       Name:  Catherine Forbes   Age:  51 y.o.  DOB:  [birth year]                                          MRN:  [id number]         Date:  03/14/2022      Surgeon: Juliann Mule):  Corinda Gubler, MD    Procedure: Procedure(s):  CARPAL TUNNEL RELEASE    Medications prior to admission:   Prior to Admission medications    Medication Sig Start Date End Date Taking? Authorizing Provider   empagliflozin (JARDIANCE) 10 MG tablet Take 1 tablet by mouth daily  Patient not taking: Reported on 03/14/2022 03/06/22   Neta Mends, MD   Semaglutide, 1 MG/DOSE, (OZEMPIC, 1 MG/DOSE,) 4 MG/3ML SOPN Inject 1 mg into the skin once a week 03/06/22   Neta Mends, MD   Solriamfetol HCl (SUNOSI) 75 MG TABS Take 75 mg by mouth daily Max Daily Amount: 75 mg 02/22/22 03/23/22  [provider]   spironolactone-hydroCHLOROthiazide (ALDACTAZIDE) 25-25 MG per tablet Take 1 tablet by mouth daily 12/07/21 06/05/22  Neta Mends, MD   rosuvastatin (CRESTOR) 10 MG tablet Take 1 tablet by mouth every other day 12/07/21 06/05/22  Neta Mends, MD   metFORMIN (GLUCOPHAGE-XR) 500 MG extended release tablet Take one with breakfast and supper  Patient not taking: Reported on 03/14/2022 12/07/21   Neta Mends, MD   lisinopril (PRINIVIL;ZESTRIL) 10 MG tablet Take 1 tablet by mouth daily 12/07/21 06/05/22  Neta Mends, MD   levothyroxine (SYNTHROID) 75 MCG tablet Take 1 tablet by mouth daily 12/07/21 06/05/22  Neta Mends, MD   Zinc 100 MG TABS Take 1 tablet by mouth daily    [provider]   vitamin D (D3-1000) 25 MCG (1000 UT) CAPS Take 1 capsule by mouth daily    [provider]       Current medications:    Current Facility-Administered Medications   Medication Dose Route Frequency Provider Last Rate Last Admin   . lactated ringers IV soln infusion   IntraVENous Continuous Elaina Hoops, MD 50 mL/hr at 03/14/22 1009 New Bag at 03/14/22 1009        Allergies:  No Known Allergies    Problem List:    Patient Active Problem List   Diagnosis Code   . Adult body mass index 40 and over IMO0002   . Essential hypertension I10   . Family history of malignant neoplasm of colon Z80.0   . History of total hysterectomy Z90.710   . History of cholecystectomy Z90.49   . Hyperlipidemia E78.5   . Polycystic ovary syndrome E28.2   . Uncontrolled type 2 diabetes mellitus IMO0002   . Vitamin D deficiency E55.9   . Glucose intolerance E74.39   . Obstructive sleep apnea G47.33   . Type 2 diabetes mellitus without complication (HCC) G95.6       Past Medical History:        Diagnosis Date   . Arthritis    . Bronchitis    . Carpal tunnel syndrome on both sides    . Colon polyp    . Diabetes mellitus ([place])    . Fatigue    . GERD (gastroesophageal reflux disease)    . Glucose  intolerance    . History of blood transfusion 1998   . History of diverticulitis    . History of vitamin D deficiency    . Hyperlipidemia    . Hypertension    . IBS (irritable bowel syndrome)    . Insomnia    . Migraine    . Obesity    . OSA on CPAP    . PCOS (polycystic ovarian syndrome)    . Thyroid disease    . Type 2 diabetes mellitus without complication ([place])    . Under care of service provider 03/01/2022    pcp-[facility]-last visit aug 2023   . Under care of service provider 03/01/2022    sleep disorder-dr jacob-[place]-last visit nov 2023   . Wears contact lenses        Past Surgical History:        Procedure Laterality Date   . BREAST CYST EXCISION Right     Age 53   . BREAST SURGERY Right     benign cyst removed    . CHOLECYSTECTOMY     . COLONOSCOPY  05/29/2019   . COLONOSCOPY N/A 05/29/2019    COLONOSCOPY WITH BIOPSY performed by Dennison Bulla, MD at [facility]   . [place]   . FOOT FRACTURE SURGERY  2012   . HERNIA REPAIR     . HYSTERECTOMY, VAGINAL      pt has had 2 miscarriages, and a D+C also   . TONSILLECTOMY     . UPPER GASTROINTESTINAL ENDOSCOPY          Social History:    Social History     Tobacco Use   . Smoking status: Never   . Smokeless tobacco: Never   Substance Use Topics   . Alcohol use: Never                                Counseling given: Not Answered      Vital Signs (Current):   Vitals:    03/14/22 0957   BP: 133/81   Pulse: 98   Resp: 16   Temp: 97.5 F (36.4 C)   TempSrc: Temporal   SpO2: 99%   Weight: 102.1 kg (225 lb)   Height: 1.6 m (_0 )                                              BP Readings from Last 3 Encounters:   03/14/22 133/81   03/06/22 126/82   03/01/22 128/82       NPO Status: Time of last liquid consumption: 2330                        Time of last solid consumption: 2330                        Date of last liquid consumption: 03/13/22                        Date of last solid food consumption: 03/13/22    BMI:   Wt Readings from Last 3 Encounters:   03/14/22 102.1 kg (225 lb)   03/06/22 103.5 kg (228 lb 3.2 oz)   03/01/22 102.1  kg (225 lb)     Body mass index is 39.86 kg/m.    CBC:   Lab Results   Component Value Date/Time    WBC 9.4 03/01/2022 10:35 AM    RBC 4.68 03/01/2022 10:35 AM    HGB 14.7 03/01/2022 10:35 AM    HCT 43.7 03/01/2022 10:35 AM    MCV 93.4 03/01/2022 10:35 AM    RDW 12.3 03/01/2022 10:35 AM    PLT 308 03/01/2022 10:35 AM       CMP:   Lab Results   Component Value Date/Time    NA 138 03/01/2022 10:35 AM    K 4.3 03/01/2022 10:35 AM    CL 99 03/01/2022 10:35 AM    CO2 28 03/01/2022 10:35 AM    BUN 10 03/01/2022 10:35 AM    CREATININE 0.7 03/01/2022 10:35 AM    GFRAA >60 05/15/2020 02:15 PM    LABGLOM >60 03/01/2022 10:35 AM    GLUCOSE 177 03/01/2022 10:35 AM    PROT 7.9 05/15/2020 02:15 PM    CALCIUM 9.7 05/15/2020 02:15 PM    BILITOT 0.42 05/15/2020 02:15 PM    ALKPHOS 61 05/15/2020 02:15 PM    AST 31 05/15/2020 02:15 PM    ALT 41 05/15/2020 02:15 PM       POC Tests:   Recent Labs     03/14/22  1004   POCGLU 163*   POCK 3.8       Coags: No results found for: "PROTIME", "INR", "APTT"    HCG (If  Applicable): No results found for: "PREGTESTUR", "PREGSERUM", "HCG", "HCGQUANT"     ABGs: No results found for: "PHART", "PO2ART", "PCO2ART", "HCO3ART", "BEART", "O2SATART"     Type & Screen (If Applicable):  No results found for: "LABABO", "LABRH"    Drug/Infectious Status (If Applicable):  No results found for: "HIV", "HEPCAB"    COVID-19 Screening (If Applicable): No results found for: "COVID19"        Anesthesia Evaluation  Patient summary reviewed no history of anesthetic complications:   Airway: Mallampati: II  TM distance: >3 FB   Neck ROM: full  Mouth opening: > = 3 FB   Dental:    (+) poor dentition      Pulmonary:normal exam    (+) sleep apnea:                             Cardiovascular:    (+) hypertension:,                   Neuro/Psych:   (+) headaches:,             GI/Hepatic/Renal:   (+) GERD: well controlled,           Endo/Other:    (+) DiabetesType II DM, , .                  ROS comment: Ozempic held for 2+ weeks.  Abdominal:   (+) obese,           Vascular: negative vascular ROS.         Other Findings:           Anesthesia Plan      MAC     ASA 3             Anesthetic plan and risks discussed with patient.      Plan discussed with CRNA.  Kam Rahimi, MD   03/14/2022

## 2022-03-14 NOTE — Interval H&P Note (Signed)
Pt Name: Catherine Forbes  MRN: [id number]  Birthdate: Jun 27, 1970  Date of evaluation: 03/14/2022    I have reviewed the patient's history and physical examination completed in pre-admission testing.    Changes to history or on examination, if any, are as follows:  none    Sharolyn Douglas, PA-C  03/14/22  10:10 AM

## 2022-03-14 NOTE — Anesthesia Post-Procedure Evaluation (Signed)
Department of Anesthesiology  Postprocedure Note    Patient: Catherine Forbes  MRN: [id number]  McKen[place]: 23-Jun-1970  Date of evaluation: 03/14/2022      Procedure Summary     Date: 03/14/22 Room / Location: [facility] OR 18 / [facility]    Anesthesia Start: 1116 Anesthesia Stop: 1206    Procedure: CARPAL TUNNEL RELEASE (Right) Diagnosis:       Carpal tunnel syndrome of right wrist      (Carpal tunnel syndrome of right wrist [G56.01])    Surgeons: Corinda Gubler, MD Responsible Provider: Corlis Hove, MD    Anesthesia Type: MAC ASA Status: 3          Anesthesia Type: MAC    Aldrete Phase I: Aldrete Score: 10    Aldrete Phase II:        Anesthesia Post Evaluation    Patient location during evaluation: bedside  Patient participation: complete - patient participated  Level of consciousness: awake  Airway patency: patent  Nausea & Vomiting: no nausea and no vomiting  Complications: no  Cardiovascular status: hemodynamically stable  Respiratory status: acceptable  Hydration status: stable  Comments: BP 133/81   Pulse 83   Temp (!) 96.5 F (35.8 C) (Temporal)   Resp 16   Ht 1.6 m (_0 )   Wt 102.1 kg (225 lb)   SpO2 99%   BMI 39.86 kg/m

## 2022-03-14 NOTE — Discharge Instructions (Addendum)
No alcoholic beverages, no driving or operating machinery, no making important decisions for 24 hours. Children should maintain quiet play ( games, movies, books ) for 24 hours.  You may have a normal diet but should eat lightly day of surgery.  Drink plenty of fluids.  Urinate within 8 hours after surgery, if unable to urinate call your doctor Call your doctor for the following:   Chills   Temperature greater than 101   Pain that is not tolerable despite taking pain medicine as ordered   There is increased swelling, redness or warmth at surgical site   There is increased drainage or bleeding from surgical site   Do not remove surgical dressing unless instructed to do so by your surgeon            No driving for two weeks after surgery.   No lifting greater than 10 pounds for the first month.   Keep dressing dry and in place until Friday.

## 2022-03-14 NOTE — Op Note (Signed)
Operative Note      Patient: Catherine Forbes  Date of Birth: December 29, 1970  MRN: [id number]    Date of Procedure: 03/24/22    Pre-Op Diagnosis Codes:     * Carpal tunnel syndrome of right wrist [G56.01]    Post-Op Diagnosis: Same       Procedure(s):  CARPAL TUNNEL RELEASE    Surgeon(s):  Catherine Forbes, Catherine Edelman, MD    Assistant:   * No surgical staff found *    Anesthesia: Monitor Anesthesia Care    Estimated Blood Loss (mL): Minimal    Complications: None    Specimens:   * No specimens in log *    Implants:  * No implants in log *      Drains: * No LDAs found *    Findings: Severe median nerve compression          Detailed Description of Procedure:   Patient is a 51 year old female who presents with a 3-year history of bilateral hand numbness and discomfort.  The patient did undergo EMG nerve conduction studies of her hands which demonstrated carpal tunnel syndrome severe on the right and more moderate on the left.  Further treatment options were discussed, conservative measures versus surgical intervention.  After reviewing the above information, the patient wished for going further conservative measures in favor surgical invention.    The patient was transported from the preanesthesia area to the operating room.  She was placed onto the operating table in the supine position.  The patient was then given IV sedation per the anesthesia team.  Continuous O2 sat monitoring was carried out throughout the case.  The arm was stretched out onto an armboard.  The right hand, forearm to above the elbow was then prepped and draped in usual sterile fashion.  The incision line was infiltrated with several cc local anesthetic.  At this point a linear incision was made in the right palm bisecting an imaginary line between the third and fourth digits.  This incision was taken from the distal wrist crease approximately 2 to 3 cm distal to that point.  The incision was carried down through the lying tissue to the level of the transverse  carpal ligament.  A small retractor was placed in the wound.  At this point the fibers of the transverse ligament were then cut using a 15 blade until small portion of the nerve was identified proximally.  With the nerve identified, a tenotomy scissors was then used to transect the ligament distally thus providing for generous decompression of the nerve.  The compression to the nerve was fairly severe at the mid to distal portions of the ligament.  Upon completion a generous decompression of the median nerve had been accomplished both proximally and distally.  A small Penfield instrument was then probed over the nerve distally and proximally with no further evidence of ongoing compression or impingement.  Thus attention was turned to closure of the wound.  The wound was irrigated multiple rounds of sterile saline.  The retractor blade was removed and hemostasis obtained.  The subcutaneous tissue and dermis layer was reapproximated using multiple simple interrupted 3-0 Vicryl sutures.  The skin edges further reinforced with multiple simple interrupted 3-0 nylon sutures.  A dry sterile dressing applied over this.  The sponge needle and instrument counts were correct in the case.  Estimate blood loss was minimal.  The patient was noted to be in satisfactory condition in the operating of this point.  Catherine Forbes  my 72 assistant did assist throughout the entire case.      Electronically signed by Corinda Gubler, MD on 03/14/2022 at 11:59 AM

## 2022-03-29 ENCOUNTER — Ambulatory Visit
Admit: 2022-03-29 | Discharge: 2022-03-29 | Payer: PRIVATE HEALTH INSURANCE | Attending: Neurological Surgery | Primary: Family Medicine

## 2022-03-29 DIAGNOSIS — G5601 Carpal tunnel syndrome, right upper limb: Secondary | ICD-10-CM

## 2022-03-29 NOTE — Progress Notes (Signed)
[facility]  [address] [place] [place], [place]  [place] [id number]  Dept: [phone]  Dept Fax: [phone]     Patient:  Catherine Forbes  Date of Birth: April 19, 1971  Date: 03/29/22      Chief Complaint   Patient presents with    Post-Op Check     1 st PO 3 wk f/u            HPI:     I had the pleasure of seeing this patient back in the office today in follow-up.  As you know she is a 51 year old female who underwent a right carpal tunnel release procedure approximately 3 weeks ago.  Postoperatively she has done well.  She has noticed a interval decrease in her previous preoperative symptoms.    Examination today demonstrates her incision to be healing well.  Incision is nonerythematous nontender.  The skin edges are well approximated.  I did remove her skin sutures without difficulty.  She has good strength and sensation in the right hand.    Overall I think this patient is progressing well from her right carpal tunnel release procedure 3 weeks ago.  I took this opportunity to discuss remaining activity restrictions limitations for the upcoming 3 to 4 weeks.  I took this opportunity to answer intermittent questions that she may have had.  I am scheduling her to return my office in 3 weeks for her next postoperative visit.  In the meantime I did invite her to feel free to contact me should she have further problems or questions which I could be of assistance with.        Physical Exam:      BP (!) 158/97   Pulse (!) 120   Temp 98.6 F (37 C)         Assessment and Plan:     1. Carpal tunnel syndrome of right wrist              Electronically signed by Corinda Gubler, MD on 03/29/2022 at 2:32 PM    Please note that this chart was generated using voice recognition Dragon dictation software.  Although every effort was made to ensure the accuracy of this automated transcription, some errors in transcription may have  occurred.

## 2022-04-03 ENCOUNTER — Encounter: Payer: PRIVATE HEALTH INSURANCE | Attending: Neurological Surgery | Primary: Family Medicine

## 2022-04-05 ENCOUNTER — Encounter: Payer: PRIVATE HEALTH INSURANCE | Attending: Neurological Surgery | Primary: Family Medicine

## 2022-04-12 ENCOUNTER — Encounter
Admit: 2022-04-12 | Discharge: 2022-04-12 | Payer: PRIVATE HEALTH INSURANCE | Attending: Family Medicine | Primary: Family Medicine

## 2022-04-12 ENCOUNTER — Inpatient Hospital Stay: Payer: PRIVATE HEALTH INSURANCE | Primary: Family Medicine

## 2022-04-12 DIAGNOSIS — E119 Type 2 diabetes mellitus without complications: Secondary | ICD-10-CM

## 2022-04-12 LAB — BASIC METABOLIC PANEL
Anion Gap: 12 mmol/L (ref 9–16)
BUN: 12 mg/dL (ref 6–20)
CO2: 27 mmol/L (ref 20–31)
Calcium: 9.6 mg/dL (ref 8.6–10.4)
Chloride: 100 mmol/L (ref 98–107)
Creatinine: 0.6 mg/dL (ref 0.50–0.90)
Est, Glom Filt Rate: >60 mL/min/{1.73_m2} (ref >60)
Glucose: 135 mg/dL — ABNORMAL HIGH (ref 74–99)
Potassium: 3.8 mmol/L (ref 3.7–5.3)
Sodium: 139 mmol/L (ref 136–145)

## 2022-04-12 NOTE — Other (Signed)
Your recent lab results are normal.

## 2022-04-12 NOTE — Progress Notes (Signed)
Subjective:     Patient ID: Catherine Forbes is a 51 y.o. female    Catherine Forbes returns today and she is much better on Ozempic 1 mg she is also tolerating the Jardiance and pending the results of her BUN and creatinine today we will bump her up from 10 to 25 mg.  We again revisited the cardiovascular risk reduction with the use of the GLP's and SGLT2's.  She verbalized understanding    She saw Dr. Edison Nasuti in regard to her obstructive sleep apnea.  He has her on a medicine Sunosi during the day which seems to help her daytime drowsiness quite a bit.    She had carpal tunnel release by Dr. Have 1 year in the right wrist does have a rash around the incision mark which I think is minor    She also complains of a rash secondary to her CPAP mask that she would like me to check today    Her hypertension has been under good control and overall she feels better          Review of Systems   Constitutional:  Negative for activity change, appetite change, fatigue and fever.   HENT:  Negative for sore throat.    Eyes:  Negative for visual disturbance.   Respiratory:  Negative for cough, chest tightness and shortness of breath.    Cardiovascular:  Negative for chest pain, palpitations and leg swelling.   Gastrointestinal:  Negative for abdominal pain, constipation and diarrhea.   Endocrine: Negative for cold intolerance.   Genitourinary:  Negative for dysuria and urgency.   Musculoskeletal:  Positive for arthralgias. Negative for back pain.   Neurological:  Negative for dizziness, syncope and headaches.   Hematological:  Does not bruise/bleed easily.   Psychiatric/Behavioral:  Negative for confusion and sleep disturbance. The patient is not nervous/anxious.            Objective:     Physical Exam  Vitals and nursing note reviewed.   Constitutional:       General: She is not in acute distress.     Appearance: Normal appearance.   HENT:      Head: Normocephalic.      Right Ear: External ear normal.      Left Ear: External ear normal.       Nose: Nose normal.      Mouth/Throat:      Mouth: Mucous membranes are moist.   Eyes:      Conjunctiva/sclera: Conjunctivae normal.   Cardiovascular:      Rate and Rhythm: Normal rate and regular rhythm.      Heart sounds: Normal heart sounds.   Pulmonary:      Effort: Pulmonary effort is normal.      Breath sounds: Normal breath sounds.   Musculoskeletal:      Cervical back: Normal range of motion.      Right lower leg: No edema.      Left lower leg: No edema.      Comments: R wrist would inspected healing well     Skin:     General: Skin is warm and dry.      Comments: Some reaction on face to CPAP mask     Neurological:      General: No focal deficit present.      Mental Status: She is alert and oriented to person, place, and time.   Psychiatric:         Mood and Affect: Mood  normal.         Behavior: Behavior normal.       Hemoglobin A1C   Date Value Ref Range Status   03/06/2022 7.7 % Final     Lab Results   Component Value Date    CHOL 219 (H) 05/02/2019     Lab Results   Component Value Date    TRIG 613 (H) 05/02/2019     Lab Results   Component Value Date    HDL 37 (L) 05/02/2019     Lab Results   Component Value Date    LDLCHOLESTEROL      05/02/2019     Lab Results   Component Value Date    VLDL NOT REPORTED 05/02/2019     Lab Results   Component Value Date    CHOLHDLRATIO 5.9 (H) 05/02/2019     Lab Results   Component Value Date    MALBCR Can not be calculated 03/06/2022        Assessment/Plan:     1. Type 2 diabetes mellitus without complication, without long-term current use of insulin ([place])    2. BMI 38.0-38.9,adult    3. Status post carpal tunnel release    4. Essential hypertension    5. Obstructive sleep apnea          Catherine Forbes was seen today for diabetes, hypertension, sleep apnea, yeast infection and allergic reaction.    Diagnoses and all orders for this visit:    Type 2 diabetes mellitus without complication, without long-term current use of insulin (HCC)  Continue Ozempic and Jardiance  BMI  38.0-38.9,adult  Reinforced the 10% weight loss over 3 months low glycemic index high-fiber diet  Status post carpal tunnel release  Does have somewhat of a rash around the incision site recommend that she apply coconut oil  Essential hypertension  Good control with lisinopril 10 mg and spironolactone/hydrochlorothiazide 2525  Obstructive sleep apnea  Rash in the mask distribution recommend coconut oil    Return to office in 3 months or so    Neta Mends, MD    Please note that this chart was generated using voice recognition Dragon dictation software.  Although every effort was made to ensure the accuracy of this automated transcription, some errors in transcription may have occurred.

## 2022-04-26 ENCOUNTER — Encounter
Admit: 2022-04-26 | Discharge: 2022-04-26 | Payer: PRIVATE HEALTH INSURANCE | Attending: Neurological Surgery | Primary: Family Medicine

## 2022-04-26 DIAGNOSIS — G5601 Carpal tunnel syndrome, right upper limb: Secondary | ICD-10-CM

## 2022-04-26 NOTE — Progress Notes (Signed)
I had the pleasure of seeing Catherine Forbes in the office today in follow up. Patient is a 52 y.o.[name] female who underwent   Carpal tunnel release procedure approximately 2 months ago.  Postoperatively she has done well and states her right hand symptoms have improved significantly compared to her preoperative status.  She does have ongoing moderate to severe carpal tunnel on the left however she states currently this hand is not bothering her and would like to maintain a conservative stance.  Vitals:    04/26/22 1159   Temp: 99.2 F (37.3 C)     Examination today demonstrates the incision to be healing well. The incision is nonerythematous and nontender.  Patient has age-appropriate strength in   Throughout the right hand.     I did take this opportunity to answer any remaining questions the patient had in the office today and welcomed them to feel free to contact us back at any point in the future should they have any problems or questions we could be of assistance with. Plan discussed and agreed upon with dr hoeflinger.

## 2022-05-03 ENCOUNTER — Encounter

## 2022-05-03 NOTE — Telephone Encounter (Signed)
Patient last seen 04/12/2022  Next appointment  07/12/2022    Patient called trying to refill Jardiance but she could not find it on her med list to refill.   When she was seen on 03/06/2022 this was first prescribed and the notes stated  Add Jardiance 10 mg daily  for 1 month then check labs and increase dose.       Please advise

## 2022-05-03 NOTE — Telephone Encounter (Signed)
From: [name]  To: Dr. Neta Mends  Sent: 05/03/2022 3:12 PM EST  Subject: Jeneva Moor Dr Naaman Plummer,   I am out of my Jardiance '10mg'$  tablets. At last visit on Dec 20th you said you would send in a new script of a higher dosage of Jardiance for me. That never got to the pharmacy or even into my MyChart account. I called earlier today and spoke to someone about this, but have not heard back yet. Just wondering if this was an oversight or if you changed your mind on me taking this prescription? You seemed to be pleased with the results so far, so I'm thinking it was just a little mess up and didn't get sent in.     Thank you for looking into this! I really appreciate you,    Catherine Forbes

## 2022-05-04 MED ORDER — EMPAGLIFLOZIN 25 MG PO TABS
25 MG | ORAL_TABLET | Freq: Every day | ORAL | 1 refills | Status: DC
Start: 2022-05-04 — End: 2022-11-29

## 2022-07-11 DIAGNOSIS — E038 Other specified hypothyroidism: Secondary | ICD-10-CM

## 2022-07-12 ENCOUNTER — Encounter: Payer: PRIVATE HEALTH INSURANCE | Attending: Family Medicine | Primary: Family Medicine

## 2022-07-13 ENCOUNTER — Encounter
Admit: 2022-07-13 | Discharge: 2022-07-13 | Payer: PRIVATE HEALTH INSURANCE | Attending: Family Medicine | Primary: Family Medicine

## 2022-07-13 DIAGNOSIS — E038 Other specified hypothyroidism: Secondary | ICD-10-CM

## 2022-07-13 LAB — POCT GLYCOSYLATED HEMOGLOBIN (HGB A1C): Hemoglobin A1C: 7.1 %

## 2022-07-13 MED ORDER — OZEMPIC (1 MG/DOSE) 4 MG/3ML SC SOPN
4 | SUBCUTANEOUS | 3 refills | Status: DC
Start: 2022-07-13 — End: 2022-10-18

## 2022-07-13 NOTE — Progress Notes (Signed)
[facility]  [address] [place] [address]  Phone: [phone]       Name: Catherine Forbes  DOB: 08-04-1970     Chief Complaint:    Catherine Forbes is a 52 y.o. year old female who presents today for   Chief Complaint   Patient presents with    Diabetes     A1c today     Hypertension    3 Month Follow-Up     Had colonoscopy with Dr Serafina Royals and will be due for her 3 year recheck and would like to see Dr Francee Piccolo opinion on who to see    Chronic Pain     At the end of the day her body will hurt/ache and knees worse when goes to stand up       History of Present Illness:    Patient establishing care. Previous Dr. Naaman Plummer patient.     Diabetes Mellitus Type II, Follow-up: Patient is here for follow-up of Type 2 diabetes mellitus.  She is on ozempic 1mg , and jardiance. Jardiance was increased from 10mg  to 25mg  since last visit. She was on metformin in the past - had GI side effects, and it did not work to lower A1c.     Hemoglobin A1C (%)   Date Value   07/13/2022 7.1   03/06/2022 7.7   12/07/2021 6.2     HTN - lisinopril and hctz and spironolactone.     HLP - crestor 10mg . Last lipid panel was quite some time ago - I do not see one in the past few years. It was ordered 02/2022 but not done.     Thyroid - synthroid 35mcg daily. Last TSH was 05/04/21.     OSA - sees sleep medicine Irena Reichmann). Sunosi worked well for her but insurance has been denying. Sounds like pulm is working on a PA.     Medications:    Outpatient Medications Prior to Visit   Medication Sig Dispense Refill    empagliflozin (JARDIANCE) 25 MG tablet Take 1 tablet by mouth daily 90 tablet 1    spironolactone-hydroCHLOROthiazide (ALDACTAZIDE) 25-25 MG per tablet Take 1 tablet by mouth daily 90 tablet 1    rosuvastatin (CRESTOR) 10 MG tablet Take 1 tablet by mouth every other day 45 tablet 1    lisinopril (PRINIVIL;ZESTRIL) 10 MG tablet Take 1 tablet by mouth daily 90 tablet 1    levothyroxine (SYNTHROID) 75 MCG tablet Take 1  tablet by mouth daily 90 tablet 1    Zinc 100 MG TABS Take 1 tablet by mouth daily      vitamin D (D3-1000) 25 MCG (1000 UT) CAPS Take 1 capsule by mouth daily      Solriamfetol HCl (SUNOSI) 75 MG TABS 2 tablet Orally Once a day for 90 days (Patient not taking: Reported on 07/13/2022)      Semaglutide, 1 MG/DOSE, (OZEMPIC, 1 MG/DOSE,) 4 MG/3ML SOPN Inject 1 mg into the skin once a week 3 mL 3     No facility-administered medications prior to visit.       Review of Systems:     Review of Systems     Physical Exam:     Vitals:  BP 118/86 (Site: Left Upper Arm, Position: Sitting, Cuff Size: Small Adult)   Pulse 96   Ht 1.6 m (5\' 3" )   Wt 101.2 kg (223 lb)   SpO2 98%   BMI 39.50 kg/m  Body mass index is 39.5  kg/m.    Physical Exam  Vitals and nursing note reviewed.   Constitutional:       Appearance: Normal appearance.   Cardiovascular:      Rate and Rhythm: Normal rate and regular rhythm.      Heart sounds: Normal heart sounds.   Pulmonary:      Effort: Pulmonary effort is normal.      Breath sounds: Normal breath sounds.   Neurological:      General: No focal deficit present.      Mental Status: She is alert.   Psychiatric:         Mood and Affect: Mood normal.         Behavior: Behavior normal.         Assessment:      Diagnosis Orders   1. Other specified hypothyroidism  TSH      2. Type 2 diabetes mellitus without complication, without long-term current use of insulin (HCC)  POCT glycosylated hemoglobin (Hb A1C)    Semaglutide, 1 MG/DOSE, (OZEMPIC, 1 MG/DOSE,) 4 MG/3ML SOPN      3. Mixed hyperlipidemia  Lipid Panel    Comprehensive Metabolic Panel, Fasting      4. Essential hypertension        5. Screen for colon cancer  [place], MD, General Surgery, [place]      6. Screening mammogram for breast cancer  MAM TOMO DIGITAL SCREEN BILATERAL      7. Arthralgia, unspecified joint  ANA Screen With Reflex                Plan:     1. Other specified hypothyroidism - labs ordered to evaluate.   -      TSH; Future  2. Type 2 diabetes mellitus without complication, without long-term current use of insulin (HCC) - A1c ideally should be <7%. She did not tolerate higher doses of ozempic in the past. Will monitor for now.   -     POCT glycosylated hemoglobin (Hb A1C)  -     Semaglutide, 1 MG/DOSE, (OZEMPIC, 1 MG/DOSE,) 4 MG/3ML SOPN; Inject 1 mg into the skin once a week, Disp-3 mL, R-3Normal  3. Mixed hyperlipidemia - due for labs. These were ordered.   -     Lipid Panel; Future  -     Comprehensive Metabolic Panel, Fasting; Future  4. Essential hypertension - controlled, continue medication at current dose  5. Screen for colon cancer  -     [place] - Catherine Capuchin, MD, General Surgery, [place]  6. Screening mammogram for breast cancer  -     MAM TOMO DIGITAL SCREEN BILATERAL; Future  7. Arthralgia, unspecified joint - she is worried about lupus, due to family history.   -     ANA Screen With Reflex; Future      MDM: Moderate MDM - 2 or more chronic conditions with prescription medication management as described above.        Return in about 3 months (around 10/13/2022) for DM, with A1c.  Orders Placed This Encounter   Medications    Semaglutide, 1 MG/DOSE, (OZEMPIC, 1 MG/DOSE,) 4 MG/3ML SOPN     Sig: Inject 1 mg into the skin once a week     Dispense:  3 mL     Refill:  3     Orders Placed This Encounter   Procedures    MAM TOMO DIGITAL SCREEN BILATERAL     Standing Status:  Future     Standing Expiration Date:   09/12/2023    Lipid Panel     Standing Status:   Future     Standing Expiration Date:   07/11/2023    Comprehensive Metabolic Panel, Fasting     Standing Status:   Future     Standing Expiration Date:   07/11/2023    TSH     Standing Status:   Future     Standing Expiration Date:   07/11/2023    ANA Screen With Reflex     Standing Status:   Future     Standing Expiration Date:   07/13/2023    [facility] - Catherine Capuchin, MD, General Surgery, [place]     Referral Priority:   Routine     Referral Type:   Eval and Treat      Referral Reason:   Specialty Services Required     Referred to Provider:   Charolotte Capuchin, MD     Requested Specialty:   General Surgery     Number of Visits Requested:   1    POCT glycosylated hemoglobin (Hb A1C)           I reviewed the above assessment and plan with Catherine Forbes.  Questions were answered and it appears that the Catherine Forbes has a good understanding of the visit today.    I would like to see Clarrisa back in Return in about 3 months (around 10/13/2022) for DM, with A1c. , or sooner if any new issues occur.    Electronically signed by Nicholes Stairs, DO on 07/13/2022 at 4:45 PM

## 2022-07-14 ENCOUNTER — Inpatient Hospital Stay: Payer: PRIVATE HEALTH INSURANCE | Primary: Family Medicine

## 2022-07-14 DIAGNOSIS — M2550 Pain in unspecified joint: Secondary | ICD-10-CM

## 2022-07-14 LAB — COMPREHENSIVE METABOLIC PANEL, FASTING
ALT: 36 U/L — ABNORMAL HIGH (ref 10–35)
AST: 17 U/L (ref 10–35)
Albumin/Globulin Ratio: 1 (ref 1.0–2.5)
Albumin: 4.3 g/dL (ref 3.5–5.2)
Alkaline Phosphatase: 63 U/L (ref 35–104)
Anion Gap: 12 mmol/L (ref 9–16)
BUN: 12 mg/dL (ref 6–20)
CO2: 23 mmol/L (ref 20–31)
Calcium: 9.4 mg/dL (ref 8.6–10.4)
Chloride: 101 mmol/L (ref 98–107)
Creatinine: 0.7 mg/dL (ref 0.50–0.90)
Est, Glom Filt Rate: >60 mL/min/{1.73_m2} (ref >60)
Glucose, Fasting: 163 mg/dL — ABNORMAL HIGH (ref 74–99)
Potassium: 4.1 mmol/L (ref 3.7–5.3)
Sodium: 136 mmol/L (ref 136–145)
Total Bilirubin: 0.2 mg/dL (ref 0.00–1.20)
Total Protein: 7.2 g/dL (ref 6.6–8.7)

## 2022-07-14 LAB — LIPID PANEL
Chol/HDL Ratio: 4
Cholesterol: 167 mg/dL (ref 0–199)
HDL: 41 mg/dL (ref >40)
LDL Cholesterol: 87 mg/dL (ref 0–100)
Triglycerides: 194 mg/dL — ABNORMAL HIGH (ref <150)
VLDL: 39 mg/dL

## 2022-07-14 LAB — TSH: TSH: 1 u[IU]/mL (ref 0.27–4.20)

## 2022-07-17 LAB — ANA SCREEN WITH REFLEX
ANA: NEGATIVE
Anti ds DNA: <0.5 IU/mL (ref <10.0)
ENA Antibodies Screen: 0.1 U/mL (ref <0.7)

## 2022-07-31 ENCOUNTER — Ambulatory Visit
Admit: 2022-07-31 | Discharge: 2022-07-31 | Payer: PRIVATE HEALTH INSURANCE | Attending: Registered Nurse | Primary: Family Medicine

## 2022-07-31 DIAGNOSIS — K635 Polyp of colon: Secondary | ICD-10-CM

## 2022-07-31 NOTE — Progress Notes (Unsigned)
[facility] General Surgery   Tyna Jaksch, MD, FACS  Cristy Hilts. Marina Goodell, APRN-CNP  [address]  [place], [place] [address]  P: [phone], F: [phone]    General and Robotic Surgery  Consult Note               PATIENT NAME: Catherine Forbes   DOB:  03-12-1971   MRN: [id number]   PCP:  Calvert Cantor, DO     TODAY'S DATE: 07/30/2022    No chief complaint on file.      HISTORY OF PRESENT ILLNESS: 52 y.o. female presents to discuss scheduling a colonoscopy.     Colonoscopy:  Last Colonoscopy Date (if applicable):  05-29-19- see findings below.  Hx of Colon Polyps: {YES***/NO:60}  Rectal Bleeding: {YES***/NO:60}  Abdominal Pain: {YES***/NO:60}  Changes in Bowel Habits: {YES***/NO:60}  Nausea / Vomiting: {YES***/NO:60}  Weight Loss: {YES***/NO:60}  Family Hx of Colon CA: {YES***/NO:60}  Exposure to ATB's: {YES***/NO:60}  Travel: {YES***/NO:60}  Hx of IBD: {YES***/NO:60}    Colonoscopy, 05-29-19:  Findings:     Cecum/Ascending colon: abnormal: Ascending colon polyp removed with large cold biopsy forceps.     Transverse colon: abnormal: Transverse colon polyp removed with large cold biopsy forceps.     Descending/Sigmoid colon: normal     Rectum/Anus: examined in normal and retroflexed positions and was normal    Pathology:  1.  Ascending colon, polyp, biopsy:  Hyperplastic polyp.     2.  Transverse colon, polyp, biopsy:  Tubular adenoma.     Major medical hx: DM, HTN, IBS, OSA.  Prior ABD/pelvic surgeries: CHOLECYSTECTOMY, HYSTERECTOMY, HERNIA REPAIR, D&C.  Blood thinning medications: NONE    PAST MEDICAL HISTORY     Past Medical History:   Diagnosis Date    Arthritis     Bronchitis     Carpal tunnel syndrome on both sides     Colon polyp     Diabetes mellitus (HCC)     Fatigue     GERD (gastroesophageal reflux disease)     Glucose intolerance     History of blood transfusion 1998    History of diverticulitis     History of vitamin D deficiency     Hyperlipidemia     Hypertension     IBS  (irritable bowel syndrome)     Insomnia     Migraine     Obesity     OSA on CPAP     PCOS (polycystic ovarian syndrome)     Thyroid disease     Type 2 diabetes mellitus without complication (HCC)     Under care of service provider 03/01/2022    pcp-[facility]-last visit aug 2023    Under care of service provider 03/01/2022    sleep disorder-dr jacob[facility]-last visit nov 2023    Wears contact lenses        PROBLEM LIST     Patient Active Problem List   Diagnosis    Essential hypertension    Family history of malignant neoplasm of colon    Hyperlipidemia    Polycystic ovary syndrome    Vitamin D deficiency    Obstructive sleep apnea    Type 2 diabetes mellitus without complication (HCC)    Carpal tunnel syndrome    Other specified hypothyroidism       SURGICAL HISTORY       Past Surgical History:   Procedure Laterality Date    BREAST CYST EXCISION Right  Age 69    BREAST SURGERY Right     benign cyst removed     CARPAL TUNNEL RELEASE Right 03/14/2022    CARPAL TUNNEL RELEASE Right 03/14/2022    CARPAL TUNNEL RELEASE performed by Lorne Skeens, MD at [facility] OR    CHOLECYSTECTOMY      COLONOSCOPY  05/29/2019    COLONOSCOPY N/A 05/29/2019    COLONOSCOPY WITH BIOPSY performed by Tyna Jaksch, MD at [facility] OR    FINGER FRACTURE SURGERY  1990    FOOT FRACTURE SURGERY  2012    HERNIA REPAIR      HYSTERECTOMY, VAGINAL      pt has had 2 miscarriages, and a D+C also    TONSILLECTOMY      UPPER GASTROINTESTINAL ENDOSCOPY         ALLERGIES   No Known Allergies    FAMILY HISTORY   family history includes COPD in her father; Colon Cancer in her mother; Dementia in her mother; Diabetes in her mother; Other in her father and mother.    SOCIAL HISTORY    reports that she has never smoked. She has never used smokeless tobacco. She reports that she does not drink alcohol and does not use drugs.    MEDICATIONS     Current Outpatient Medications:     Solriamfetol HCl (SUNOSI) 75 MG TABS, 2 tablet Orally Once a day  for 90 days (Patient not taking: Reported on 07/13/2022), Disp: , Rfl:     Semaglutide, 1 MG/DOSE, (OZEMPIC, 1 MG/DOSE,) 4 MG/3ML SOPN, Inject 1 mg into the skin once a week, Disp: 3 mL, Rfl: 3    empagliflozin (JARDIANCE) 25 MG tablet, Take 1 tablet by mouth daily, Disp: 90 tablet, Rfl: 1    spironolactone-hydroCHLOROthiazide (ALDACTAZIDE) 25-25 MG per tablet, Take 1 tablet by mouth daily, Disp: 90 tablet, Rfl: 1    rosuvastatin (CRESTOR) 10 MG tablet, Take 1 tablet by mouth every other day, Disp: 45 tablet, Rfl: 1    lisinopril (PRINIVIL;ZESTRIL) 10 MG tablet, Take 1 tablet by mouth daily, Disp: 90 tablet, Rfl: 1    levothyroxine (SYNTHROID) 75 MCG tablet, Take 1 tablet by mouth daily, Disp: 90 tablet, Rfl: 1    Zinc 100 MG TABS, Take 1 tablet by mouth daily, Disp: , Rfl:     vitamin D (D3-1000) 25 MCG (1000 UT) CAPS, Take 1 capsule by mouth daily, Disp: , Rfl:      ***  Review of Systems    VITALS:  There were no vitals taken for this visit.    ***  Physical Exam           Data  Lab Results   Component Value Date    WBC 9.4 03/01/2022    HGB 14.7 03/01/2022    HCT 43.7 03/01/2022    MCV 93.4 03/01/2022    PLT 308 03/01/2022     Lab Results   Component Value Date    NA 136 07/14/2022    K 4.1 07/14/2022    CL 101 07/14/2022    CO2 23 07/14/2022    BUN 12 07/14/2022    CREATININE 0.7 07/14/2022    GLUCOSE 135 (H) 04/12/2022    CALCIUM 9.4 07/14/2022    PROT 7.2 07/14/2022    LABALBU 4.3 07/14/2022    BILITOT 0.2 07/14/2022    ALKPHOS 63 07/14/2022    AST 17 07/14/2022    ALT 36 (H) 07/14/2022    LABGLOM >60 07/14/2022  GFRAA >60 05/15/2020    AGRATIO 1.0 07/14/2022           Radiology Review:    CT Result (most recent):  CT CHEST PULMONARY EMBOLISM W CONTRAST 05/15/2020    Narrative  EXAMINATION:  CTA OF THE CHEST 05/15/2020 3:17 pm    TECHNIQUE:  CTA of the chest was performed after the administration of intravenous  contrast.  Multiplanar reformatted images are provided for review.  MIP  images are provided for  review. Dose modulation, iterative reconstruction,  and/or weight based adjustment of the mA/kV was utilized to reduce the  radiation dose to as low as reasonably achievable.    COMPARISON:  None.    HISTORY:  ORDERING SYSTEM PROVIDED HISTORY: chest pain, elevated dimer  TECHNOLOGIST PROVIDED HISTORY:  chest pain, elevated dimer  Decision Support Exception - unselect if not a suspected or confirmed  emergency medical condition->Emergency Medical Condition (MA)  Reason for Exam: chest pain, elevated dimer.  Relevant Medical/Surgical History: HTN, DM2    FINDINGS:  Pulmonary Arteries: Pulmonary arteries are adequately opacified for  evaluation.  No evidence of intraluminal filling defect to suggest pulmonary  embolism.  Main pulmonary artery is normal in caliber.    Mediastinum: No evidence of mediastinal lymphadenopathy.  The heart and  pericardium demonstrate no acute abnormality.  There is no acute abnormality  of the thoracic aorta.    Lungs/pleura: The lungs are without acute process.  No focal consolidation or  pulmonary edema.  No evidence of pleural effusion or pneumothorax.    Upper Abdomen: Limited images of the upper abdomen are unremarkable.    Soft Tissues/Bones: No acute bone or soft tissue abnormality.  Multiple  chronic fracture deformities of lateral aspect of the mid left  rib cage.    Impression  No evidence of pulmonary embolism or acute pulmonary abnormality.    Multiple chronic fracture deformities of left rib cage.    RECOMMENDATIONS:  Unavailable      ASSESSMENT   52 y.o. female to be scheduled for colonoscopy   Prior colonoscopy, hx of colon polyps  Major medical hx: DM, HTN, IBS, OSA.  Prior ABD/pelvic surgeries: CHOLECYSTECTOMY, HYSTERECTOMY, HERNIA REPAIR, D&C.  Blood thinning medications: NONE  + family hx of colon CA    PLAN  Patient to be scheduled for colonoscopy as detailed below:    SURGERY/PROCEDURE SCHEDULING  PROCEDURE NAME: Diagnsostic Colonoscopy    PROCEDURE DIAGNOSIS: Hx of  colon polyps    ANESTHESIA TYPE: General    BLOOD THINNERS: None    CLEARANCE: None    [facility]OSPITAL: [facility]    Bowel prep. Colonoscopy prep instructions discussed, copy of instructions provided. Pre-admission testing per protocol with [facility]. Procedure risk and complications, risk-benefit alternatives, recovery restrictions were all discussed with the patient at length.  Pre-operative, intra-operative, post-operative details regarding procedure discussed with patient and all questions were answered. Patient understands and wants to proceed.    ENCOUNTER DIAGNOSES  No diagnosis found.    No follow-ups on file.    The patient, Catherine Forbes is a 52 y.o. female, was seen with a total time spent of *** minutes for the visit on this date of service by the E/M provider. The time component had both face to face and non face to face time spent in determining the total time component.  Counseling and education regarding the patient's diagnosis listed below and the patient's options regarding those diagnoses were also included in determining  the time component.      Electronically signed by Minette Headland, APRN - CNP  on 07/30/2022 at 6:44 AM

## 2022-07-31 NOTE — Patient Instructions (Signed)
[facility]   Dr. Manish M. Thusay, MD  Shawna Perry, NP  [address]  [address]  [place]  [address]  Office:  [phone]  Fax:  [phone]  Miralax (Polyethylene Glycol)  STOP Aspirin 7 Days prior to Procedure  STOP all other Blood Thinners 5 Days prior to Procedure        **DO NOT EAT ANY SOLID FOOD THE DAY BEFORE YOUR PROCEDURE**  **YOU MUST BE ON A CLEAR LIQUID DIET ONLY**    Approved Clear Liquids:  Any flavor of soda except Red or Purple  Fruit Juice without pulp  Coffee or tea without dairy products  Jell-O without fruit or toppings, No Red or Purple  Pop-sickles or Italian Ice, No Red or Purple  Chicken or Beef broth and bouillon      DRINK PLENTY OF WATER THROUGHOUT THE DAY    At 10:00 am the day before your procedure, take all 4 Dulcolax Tablets.  At 6:00 pm the day before your procedure, mix the ENTIRE bottle of Miralax (238 gram or Close to it) with 64 ounces of Gatorade (NOT RED or PURPLE).  You must consume the entire 64 ounces by 8:00 pm.  Continue clear liquid diet up until midnight.    NOTHING TO EAT, DRINK, SMOKE, OR CHEW AFTER MIDNIGHT    You may brush your teeth, rinse, gargle, and spit.  Heart or Blood pressure medications ONLY with a small sip of water, unless otherwise directed.  Shower with regular soap and water.    YOU MUST HAVE AN ADULT EITHER DRIVE YOU RO RIDE IN A CAB WITH YOU

## 2022-08-02 DIAGNOSIS — K635 Polyp of colon: Secondary | ICD-10-CM

## 2022-08-03 MED ORDER — BISACODYL 5 MG PO TBEC
5 MG | ORAL_TABLET | ORAL | 0 refills | Status: DC
Start: 2022-08-03 — End: 2022-10-02

## 2022-08-03 MED ORDER — POLYETHYLENE GLYCOL 3350 17 GM/SCOOP PO POWD
17 GM/SCOOP | ORAL | 0 refills | Status: DC
Start: 2022-08-03 — End: 2022-10-02

## 2022-08-03 NOTE — Telephone Encounter (Signed)
PAT per anesthesia protocol.  Prep sent to patient's pharmacy, signing encounter.

## 2022-08-03 NOTE — Telephone Encounter (Signed)
Spoke with patient to schedule procedure   Full pre op instructions given and understood. Patient prefers My Chart    EM/SC/10-02-2022 at 8:45am/Colonoscopy/Catherine Forbes      [facility]   Dr. Tyna Jaksch, MD  Carmina Miller, NP  [address]  [address]  [place], [place]  [address]  Office:  [phone]  Fax:  [phone]    Lizbet Cirrincione  BD:02-19-1971    Miralax (Polyethylene Glycol)  STOP Aspirin 7 Days prior to Procedure  STOP all other Blood Thinners 5 Days prior to Procedure  **DO NOT EAT ANY SOLID FOOD THE DAY BEFORE YOUR PROCEDURE**  **YOU MUST BE ON A CLEAR LIQUID DIET ONLY**    Approved Clear Liquids:  Any flavor of soda except Red or Purple  Fruit Juice without pulp  Coffee or tea without dairy products  Jell-O without fruit or toppings, No Red or Purple  Pop-sickles or [place] Ice, No Red or Purple  Chicken or Beef broth and bouillon      DRINK PLENTY OF WATER THROUGHOUT THE DAY    At 10:00 am the day before your procedure, take all 4 Dulcolax Tablets.  At 6:00 pm the day before your procedure, mix the ENTIRE bottle of Miralax (238 gram or Close to it) with 64 ounces of Gatorade (NOT RED or PURPLE).  You must consume the entire 64 ounces by 8:00 pm.  Continue clear liquid diet up until midnight.    NOTHING TO EAT, DRINK, SMOKE, OR CHEW AFTER MIDNIGHT    You may brush your teeth, rinse, gargle, and spit.  Heart or Blood pressure medications ONLY with a small sip of water, unless otherwise directed.  Shower with regular soap and water.    YOU MUST HAVE AN ADULT EITHER DRIVE YOU RO RIDE IN A CAB WITH YOU    MY EXAM IS SCHEDULED FOR:  Date of Procedure: 10/02/2022  Time: 8:45am  Arrival Time:  6:45am  Location:  [facility]  Your scripts have been sent to:  [facility]  Pre-Testing:  09/19/2022 at 9:30 Nurse will call - please be available  Post Op Appointment: 10/25/2022 at 10:00am

## 2022-09-19 ENCOUNTER — Inpatient Hospital Stay: Admit: 2022-09-19 | Payer: PRIVATE HEALTH INSURANCE | Primary: Family Medicine

## 2022-09-19 NOTE — Progress Notes (Signed)
Pre-op Instructions For Out-Patient Endoscopy Surgery    Medication Instructions:  Please stop herbs and any supplements now (includes vitamins and minerals).    Please contact your surgeon and prescribing physician for pre-op instructions for any blood thinners.    If you have inhalers/aerosol treatments at home, please use them the morning of your surgery and bring the inhalers with you to the hospital.    Please take the following medications the morning of your surgery with a sip of water:    levothyroxine    Surgery Instructions:  After midnight before surgery:  Do not eat or drink anything, including water, mints, gum, and hard candy.  You may brush your teeth without swallowing.  No smoking, chewing tobacco, or street drugs.    Please shower or bathe before surgery.       Please do not wear any cologne, lotion, powder, jewelry, piercings, perfume, makeup, nail polish, hair accessories, or hair spray on the day of surgery.  Wear loose comfortable clothing.    Leave your valuables at home but bring a payment source for any after-surgery prescriptions you plan to fill at [facility].  Bring a storage case for any glasses/contacts.    An adult who is responsible for you MUST drive you home and should be with you for the first 24 hours after surgery.     The Day of Surgery:  Arrive at [facility] Entr[facility] at the time directed by your surgeon and check in at the desk.     If you have a living will or healthcare power of attorney, please bring a copy.    You will be taken to the pre-op holding area where you will be prepared for surgery.  A physical assessment will be performed by a nurse practitioner or house officer.  Your IV will be started and you will meet your anesthesiologist.    When you go to surgery, your family will be directed to the surgical waiting room, where the doctor should speak with them after your surgery.    After surgery, you will be taken to the recovery area.   When you are alert and stable, you will receive instructions and be prepared for discharge.   Instructions reviewed by phone with Caryl Asp, she verbalized understanding.

## 2022-09-28 NOTE — Patient Instructions (Signed)
No answer, left message ?       yes                      Unable to leave message ?    When were you told to arrive at hospital ?  0645    Do you have a driver ?    Are you on any blood thinners ?   no                  If yes when did you stop taking ?    Do you have your prep Rx filled and instruction ?      Nothing to eat the day before , only clear liquids.    Are you experiencing any covid symptoms ?     Do you have any infections or rash we should be aware of ?      Do you have the Hibiclens soap to use the night before and the morning of surgery ? N/a    Nothing to eat or drink after midnight, only a sip of water to take any medication instructed to take the night before.  Wear comfortable clothing, leave any valuables at home, remove any jewelry and body piercing .

## 2022-09-29 ENCOUNTER — Encounter

## 2022-09-29 MED ORDER — SPIRONOLACTONE-HCTZ 25-25 MG PO TABS
25-25 MG | ORAL_TABLET | Freq: Every day | ORAL | 1 refills | Status: DC
Start: 2022-09-29 — End: 2023-07-19

## 2022-09-29 MED ORDER — LEVOTHYROXINE SODIUM 75 MCG PO TABS
75 MCG | ORAL_TABLET | Freq: Every day | ORAL | 1 refills | Status: DC
Start: 2022-09-29 — End: 2023-03-28

## 2022-09-29 MED ORDER — LISINOPRIL 10 MG PO TABS
10 MG | ORAL_TABLET | Freq: Every day | ORAL | 1 refills | Status: AC
Start: 2022-09-29 — End: 2023-08-01

## 2022-09-29 NOTE — Telephone Encounter (Signed)
Catherine Forbes is calling to request a refill on the following medication(s):    Medication Request:  Requested Prescriptions     Pending Prescriptions Disp Refills    levothyroxine (SYNTHROID) 75 MCG tablet 90 tablet 1     Sig: Take 1 tablet by mouth daily    lisinopril (PRINIVIL;ZESTRIL) 10 MG tablet 90 tablet 1     Sig: Take 1 tablet by mouth daily    spironolactone-hydroCHLOROthiazide (ALDACTAZIDE) 25-25 MG per tablet 90 tablet 1     Sig: Take 1 tablet by mouth daily       Last Visit Date (If Applicable):  07/13/2022    Next Visit Date:    10/13/2022

## 2022-10-02 ENCOUNTER — Inpatient Hospital Stay: Payer: PRIVATE HEALTH INSURANCE | Attending: Surgery

## 2022-10-02 LAB — POC GLUCOSE FINGERSTICK: POC Glucose: 145 mg/dL — ABNORMAL HIGH (ref 65–105)

## 2022-10-02 MED ORDER — PROPOFOL 200 MG/20ML IV EMUL
200 | INTRAVENOUS | Status: AC
Start: 2022-10-02 — End: ?

## 2022-10-02 MED ORDER — ROCURONIUM BROMIDE 50 MG/5ML IV SOLN
50 | INTRAVENOUS | Status: AC
Start: 2022-10-02 — End: ?

## 2022-10-02 MED ORDER — MIDAZOLAM HCL 2 MG/2ML IJ SOLN
2 | INTRAMUSCULAR | Status: AC
Start: 2022-10-02 — End: ?

## 2022-10-02 MED ORDER — LIDOCAINE HCL (PF) 2 % IJ SOLN
2 | INTRAMUSCULAR | Status: AC
Start: 2022-10-02 — End: ?

## 2022-10-02 MED ORDER — DEXAMETHASONE SODIUM PHOSPHATE 4 MG/ML IJ SOLN
4 | INTRAMUSCULAR | Status: AC
Start: 2022-10-02 — End: ?

## 2022-10-02 MED ORDER — ONDANSETRON HCL 4 MG/2ML IJ SOLN
4 | INTRAMUSCULAR | Status: AC
Start: 2022-10-02 — End: ?

## 2022-10-02 MED ORDER — DEXAMETHASONE SODIUM PHOSPHATE 4 MG/ML IJ SOLN
4 MG/ML | INTRAMUSCULAR | Status: DC | PRN
  Administered 2022-10-02: 13:00:00 4 via INTRAVENOUS

## 2022-10-02 MED ORDER — LIDOCAINE HCL (PF) 2 % IJ SOLN
2 % | INTRAMUSCULAR | Status: DC | PRN
  Administered 2022-10-02: 13:00:00 50 via INTRAVENOUS

## 2022-10-02 MED ORDER — PROPOFOL 200 MG/20ML IV EMUL
200 MG/20ML | INTRAVENOUS | Status: DC | PRN
  Administered 2022-10-02: 13:00:00 150 via INTRAVENOUS

## 2022-10-02 MED ORDER — PHENYLEPHRINE HCL (PRESSORS) 10 MG/ML IV SOLN
10 MG/ML | INTRAVENOUS | Status: DC | PRN
  Administered 2022-10-02: 13:00:00 100 via INTRAVENOUS

## 2022-10-02 MED ORDER — SODIUM CHLORIDE 0.9 % IV SOLN
0.9 | INTRAVENOUS | Status: DC | PRN
Start: 2022-10-02 — End: 2022-10-02

## 2022-10-02 MED ORDER — SUGAMMADEX SODIUM 200 MG/2ML IV SOLN
200 | INTRAVENOUS | Status: AC
Start: 2022-10-02 — End: ?

## 2022-10-02 MED ORDER — LIDOCAINE HCL (PF) 1 % IJ SOLN
1 | Freq: Once | INTRAMUSCULAR | Status: DC | PRN
Start: 2022-10-02 — End: 2022-10-02

## 2022-10-02 MED ORDER — ESMOLOL HCL 100 MG/10ML IV SOLN
100 | INTRAVENOUS | Status: AC
Start: 2022-10-02 — End: ?

## 2022-10-02 MED ORDER — NORMAL SALINE FLUSH 0.9 % IV SOLN
0.9 | Freq: Two times a day (BID) | INTRAVENOUS | Status: DC
Start: 2022-10-02 — End: 2022-10-02

## 2022-10-02 MED ORDER — METOCLOPRAMIDE HCL 5 MG/ML IJ SOLN
5 | INTRAMUSCULAR | Status: AC
Start: 2022-10-02 — End: ?

## 2022-10-02 MED ORDER — PHENYLEPHRINE HCL (PRESSORS) 10 MG/ML IV SOLN
10 | INTRAVENOUS | Status: AC
Start: 2022-10-02 — End: ?

## 2022-10-02 MED ORDER — METOCLOPRAMIDE HCL 5 MG/ML IJ SOLN
5 MG/ML | INTRAMUSCULAR | Status: DC | PRN
  Administered 2022-10-02: 13:00:00 10 via INTRAVENOUS

## 2022-10-02 MED ORDER — GLYCOPYRROLATE 0.2 MG/ML IJ SOLN
0.2 | INTRAMUSCULAR | Status: AC
Start: 2022-10-02 — End: ?

## 2022-10-02 MED ORDER — ONDANSETRON HCL 4 MG/2ML IJ SOLN
4 MG/2ML | INTRAMUSCULAR | Status: DC | PRN
  Administered 2022-10-02: 14:00:00 4 via INTRAVENOUS

## 2022-10-02 MED ORDER — ESMOLOL HCL 100 MG/10ML IV SOLN
100 MG/10ML | INTRAVENOUS | Status: DC | PRN
  Administered 2022-10-02: 13:00:00 20 via INTRAVENOUS

## 2022-10-02 MED ORDER — MIDAZOLAM HCL 2 MG/2ML IJ SOLN
2 MG/2ML | INTRAMUSCULAR | Status: DC | PRN
  Administered 2022-10-02: 13:00:00 2 via INTRAVENOUS

## 2022-10-02 MED ORDER — SODIUM CHLORIDE 0.9 % IV SOLN
0.9 | INTRAVENOUS | Status: DC
Start: 2022-10-02 — End: 2022-10-02
  Administered 2022-10-02: 12:00:00 via INTRAVENOUS

## 2022-10-02 MED ORDER — SUGAMMADEX SODIUM 200 MG/2ML IV SOLN
200 MG/2ML | INTRAVENOUS | Status: DC | PRN
  Administered 2022-10-02: 14:00:00 200 via INTRAVENOUS

## 2022-10-02 MED ORDER — NORMAL SALINE FLUSH 0.9 % IV SOLN
0.9 | INTRAVENOUS | Status: DC | PRN
Start: 2022-10-02 — End: 2022-10-02

## 2022-10-02 MED ORDER — ROCURONIUM BROMIDE 50 MG/5ML IV SOLN
50 MG/5ML | INTRAVENOUS | Status: DC | PRN
  Administered 2022-10-02: 13:00:00 50 via INTRAVENOUS

## 2022-10-02 MED ORDER — SCOPOLAMINE 1 MG/3DAYS TD PT72
1 | Freq: Once | TRANSDERMAL | Status: DC
Start: 2022-10-02 — End: 2022-10-02
  Administered 2022-10-02: 12:00:00 1 via TRANSDERMAL

## 2022-10-02 MED FILL — ROCURONIUM BROMIDE 50 MG/5ML IV SOLN: 50 MG/5ML | INTRAVENOUS | Qty: 5

## 2022-10-02 MED FILL — PHENYLEPHRINE HCL (PRESSORS) 10 MG/ML IV SOLN: 10 MG/ML | INTRAVENOUS | Qty: 1

## 2022-10-02 MED FILL — XYLOCAINE-MPF 2 % IJ SOLN: 2 % | INTRAMUSCULAR | Qty: 5

## 2022-10-02 MED FILL — GLYCOPYRROLATE 0.2 MG/ML IJ SOLN: 0.2 MG/ML | INTRAMUSCULAR | Qty: 1

## 2022-10-02 MED FILL — BRIDION 200 MG/2ML IV SOLN: 200 MG/2ML | INTRAVENOUS | Qty: 2

## 2022-10-02 MED FILL — DEXAMETHASONE SODIUM PHOSPHATE 4 MG/ML IJ SOLN: 4 MG/ML | INTRAMUSCULAR | Qty: 1

## 2022-10-02 MED FILL — MIDAZOLAM HCL 2 MG/2ML IJ SOLN: 2 MG/ML | INTRAMUSCULAR | Qty: 2

## 2022-10-02 MED FILL — DIPRIVAN 200 MG/20ML IV EMUL: 200 MG/20ML | INTRAVENOUS | Qty: 20

## 2022-10-02 MED FILL — SCOPOLAMINE 1 MG/3DAYS TD PT72: 1 MG/3DAYS | TRANSDERMAL | Qty: 1

## 2022-10-02 MED FILL — ESMOLOL HCL 100 MG/10ML IV SOLN: 100 MG/10ML | INTRAVENOUS | Qty: 10

## 2022-10-02 MED FILL — ONDANSETRON HCL 4 MG/2ML IJ SOLN: 4 MG/2ML | INTRAMUSCULAR | Qty: 2

## 2022-10-02 MED FILL — METOCLOPRAMIDE HCL 5 MG/ML IJ SOLN: 5 MG/ML | INTRAMUSCULAR | Qty: 2

## 2022-10-02 NOTE — Other (Deleted)
IV successful after 2 attempts however unable to obtain enough blood for the lab work. RN spoke with lab about needing blood drawn for procedure that was scheduled for 1000.

## 2022-10-02 NOTE — Addendum Note (Signed)
Addendum  created 10/02/22 1520 by Mare Loan, MD    [facility]procedure Staff edited

## 2022-10-02 NOTE — Op Note (Signed)
[facility] General Surgery   Tyna Jaksch, MD, FACS  Cristy Hilts. Marina Goodell, APRN-CNP  [address], [address]  [place], [place] [address]  P: [phone], F: [phone]    PROCEDURE NOTE    DATE OF PROCEDURE: 10/02/2022    SURGEON: Tery Sanfilippo, MD    ASSISTANT: None    PREOPERATIVE DIAGNOSIS: History of colon polyp    POSTOPERATIVE DIAGNOSIS: Fair preparation with somewhat limited visualization but no gross polyps or lesions seen    OPERATION: Total colonoscopy to cecum with intubation of terminal ileum    ANESTHESIA: General    ESTIMATED BLOOD LOSS: None    COMPLICATIONS: None     SPECIMENS:  Was Not Obtained    HISTORY: The patient is a 52 y.o. year old female with history of above preop diagnosis.  I recommended colonoscopy with possible biopsy or polypectomy and I explained the risk, benefits, expected outcome, and alternatives to the procedure.  Risks included but are not limited to bleeding, infection, respiratory distress, hypotension, and perforation of the colon and possibility of missing a lesion.  The patient understands and is in agreement.      PROCEDURE: The patient was given IV conscious sedation.  The patient's SPO2 remained above 90% throughout the procedure. Digital rectal exam was normal.  The colonoscope was inserted through the anus into the rectum and advanced under direct vision to the cecum without difficulty.  Terminal ileum was examined for approximately 2 inches.  The prep was fair.      Findings:  Terminal ileum: normal    Cecum/Ascending colon: normal    Transverse colon: normal    Descending/Sigmoid colon: normal    Rectum/Anus: examined in normal and retroflexed positions and was normal    Withdrawal Time was (minutes): 18      Next screening colonoscopy: 3 years.  If screening is less than 10 years the recommended reason is due: History of colon polyp and fair preparation    The colon was decompressed.  While withdrawing the scope the above findings were verified and the  scope was removed.  The patient tolerated the procedure and conscious sedation without unusual events.    In the recovery room patient was examined and remains hemodynamically stable.  Discharge home when criteria met.    Recommendations/Plan:   F/U In Office as instructed  Discussed with the family  High fiber diet   Precautions to avoid constipation    Electronically signed by Tery Sanfilippo, MD  on 10/02/2022 at 9:48 AM

## 2022-10-02 NOTE — H&P (Signed)
HISTORY and PHYSICAL  [facility]       NAME:  Catherine Forbes  MRN: [id number]   Date of Birth:  May 21, 1970   Date: 10/02/2022   Age: 52 y.o.  Gender: female       COMPLAINT AND PRESENT HISTORY:       Catherine Forbes is 52 y.o.  female, here for     Procedure(s):  COLONOSCOPY DIAGNOSTIC    Pre-Op Diagnosis Codes:     * Hx of colonic polyps [Z86.010]    Denies abdominal pain. Has abdominal bloating.   Denies dysphagia, heartburn.  Hx of GERD. Does not take PPI.   Denies nausea, vomiting.  Pt has daily diarrhea with approximately 2-3 stools per day.  Denies constipation.  Denies blood in stool, dark tarry stools.  Denies changes in appetite and unintended weight loss.  Has family history of colon cancer in her mother.     Completed and followed prescribed prep. NPO p MN. Took levothyroxine this am with sip of water. Stopped vitamins/supplements 3 days ago. Denies taking any blood thinning medications. Denies recent or current chest pain/pressure, palpitations, SOB, recent URI, fever or chills.       RECENT LABS, IMAGING AND TESTING     Lab Results   Component Value Date    WBC 9.4 03/01/2022    RBC 4.68 03/01/2022    HGB 14.7 03/01/2022    HCT 43.7 03/01/2022    MCV 93.4 03/01/2022    MCH 31.4 03/01/2022    MCHC 33.6 03/01/2022    RDW 12.3 03/01/2022    PLT 308 03/01/2022    MPV 9.3 03/01/2022        Lab Results   Component Value Date    NA 136 07/14/2022    K 4.1 07/14/2022    CL 101 07/14/2022    CO2 23 07/14/2022    BUN 12 07/14/2022    CREATININE 0.7 07/14/2022    GLUCOSE 135 (H) 04/12/2022    CALCIUM 9.4 07/14/2022    BILITOT 0.2 07/14/2022    ALKPHOS 63 07/14/2022    AST 17 07/14/2022    ALT 36 (H) 07/14/2022         PAST MEDICAL HISTORY     Past Medical History:   Diagnosis Date    Arthritis     Bronchitis     Carpal tunnel syndrome on both sides     Colon polyp     Diabetes mellitus (HCC)     Fatigue     GERD (gastroesophageal reflux disease)     Glucose intolerance     History of blood  transfusion 1998    History of diverticulitis     History of vitamin D deficiency     Hyperlipidemia     Hypertension     IBS (irritable bowel syndrome)     Insomnia     Migraine     Obesity     OSA on CPAP     PCOS (polycystic ovarian syndrome)     PONV (postoperative nausea and vomiting)     Thyroid disease     Type 2 diabetes mellitus without complication (HCC)     Under care of service provider 03/01/2022    pcp-[facility]-[place]-last visit aug 2023    Under care of service provider 03/01/2022    sleep disorder-dr jacob-[place]-last visit nov 2023    Wears contact lenses        SURGICAL HISTORY  Past Surgical History:   Procedure Laterality Date    BREAST CYST EXCISION Right     Age 58    BREAST SURGERY Right     benign cyst removed     CARPAL TUNNEL RELEASE Right 03/14/2022    CARPAL TUNNEL RELEASE performed by Lorne Skeens, MD at [facility] OR    CHOLECYSTECTOMY      COLONOSCOPY N/A 05/29/2019    COLONOSCOPY WITH BIOPSY performed by Tyna Jaksch, MD at [facility] OR    FINGER FRACTURE SURGERY  1990    FOOT FRACTURE SURGERY  2012    HERNIA REPAIR      umbilical    HYSTERECTOMY, VAGINAL      pt has had 2 miscarriages, and a D+C also    TONSILLECTOMY      UPPER GASTROINTESTINAL ENDOSCOPY         FAMILY HISTORY       Family History   Problem Relation Age of Onset    Colon Cancer Mother         before age 3     Other Mother         lupus     Diabetes Mother     Dementia Mother     Other Father         thyroid disease    COPD Father        SOCIAL HISTORY       Social History     Socioeconomic History    Marital status: Married   Tobacco Use    Smoking status: Never    Smokeless tobacco: Never   Vaping Use    Vaping Use: Never used   Substance and Sexual Activity    Alcohol use: Never    Drug use: Never    Sexual activity: Yes     Partners: Male     Social Determinants of Health     Financial Resource Strain: Low Risk  (08/29/2021)    Overall Financial Resource Strain (CARDIA)     Difficulty of Paying  Living Expenses: Not hard at all   Transportation Needs: Unknown (08/29/2021)    PRAPARE - Transportation     Lack of Transportation (Non-Medical): No   Housing Stability: Unknown (08/29/2021)    Housing Stability Vital Sign     Unstable Housing in the Last Year: No           REVIEW OF SYSTEMS      No Known Allergies    No current facility-administered medications on file prior to encounter.     Current Outpatient Medications on File Prior to Encounter   Medication Sig Dispense Refill    bisacodyl (DULCOLAX) 5 MG EC tablet Take all 4 dulcolax tablets together by mouth at 10:00 AM the day prior to procedure. 4 tablet 0    polyethylene glycol (GLYCOLAX) 17 GM/SCOOP powder Mix 238 grams of Miralax with 64 ounces of Gatorade (not red or purple) and consume all liquid between 6 PM and 8 PM the evening prior to procedure. 238 g 0    Solriamfetol HCl 150 MG TABS 150 mg      Semaglutide, 1 MG/DOSE, (OZEMPIC, 1 MG/DOSE,) 4 MG/3ML SOPN Inject 1 mg into the skin once a week 3 mL 3    empagliflozin (JARDIANCE) 25 MG tablet Take 1 tablet by mouth daily 90 tablet 1    rosuvastatin (CRESTOR) 10 MG tablet Take 1 tablet by mouth every other day 45 tablet 1  Zinc 100 MG TABS Take 1 tablet by mouth daily      vitamin D (D3-1000) 25 MCG (1000 UT) CAPS Take 1 capsule by mouth daily       Notation: Above medications are not currently reconciled at time of signing this H&P note, to be reconciled in pre-op per RN.     Review of Systems   Constitutional:  Negative for chills and fever.   HENT:  Negative for dental problem, hearing loss and sore throat.    Eyes:  Positive for visual disturbance (Glasses).   Respiratory:  Negative for cough and shortness of breath.    Cardiovascular:  Negative for chest pain and palpitations.   Gastrointestinal:         See HPI   Genitourinary:  Negative for dysuria and hematuria.        Recent vaginal yeast infection, was treated and symptoms improved.    Musculoskeletal:  Positive for back pain and neck pain.    Skin:  Negative for rash and wound.   Neurological:  Negative for speech difficulty.   Hematological:  Does not bruise/bleed easily.         GENERAL PHYSICAL EXAM     Vitals: Review vitals per RN flowsheet.                              Physical Exam  Constitutional:       General: She is not in acute distress.     Appearance: She is well-developed. She is obese. She is not ill-appearing.   HENT:      Head: Normocephalic and atraumatic.      Nose: Nose normal.      Mouth/Throat:      Mouth: Mucous membranes are moist.      Pharynx: Oropharynx is clear. No oropharyngeal exudate or posterior oropharyngeal erythema.   Eyes:      General: No scleral icterus.        Right eye: No discharge.         Left eye: No discharge.      Pupils: Pupils are equal, round, and reactive to light.   Neck:      Trachea: No tracheal deviation.   Cardiovascular:      Rate and Rhythm: Normal rate and regular rhythm.      Heart sounds: Normal heart sounds. No murmur heard.     No friction rub. No gallop.   Pulmonary:      Effort: Pulmonary effort is normal. No respiratory distress.      Breath sounds: Normal breath sounds. No wheezing, rhonchi or rales.   Abdominal:      General: Bowel sounds are normal. There is no distension.      Palpations: Abdomen is soft.      Tenderness: There is no abdominal tenderness. There is no guarding.   Musculoskeletal:      Cervical back: Neck supple.   Skin:     General: Skin is warm and dry.      Coloration: Skin is not jaundiced.      Findings: No bruising, erythema or rash.   Neurological:      General: No focal deficit present.      Mental Status: She is alert and oriented to person, place, and time.      Cranial Nerves: No cranial nerve deficit.      Gait: Gait normal.   Psychiatric:  Mood and Affect: Mood normal.        PROVISIONAL DIAGNOSES / SURGERY:      COLONOSCOPY DIAGNOSTIC    Pre-Op Diagnosis Codes:     * Hx of colonic polyps [Z86.010]     Patient Active Problem List    Diagnosis Date  Noted    Benign colon polyp 08/02/2022    Rectal bleeding 08/02/2022    Chronic diarrhea 08/02/2022    History of hemorrhoids 08/02/2022    Other specified hypothyroidism 07/11/2022    Carpal tunnel syndrome 03/14/2022    Obstructive sleep apnea 02/17/2020    Type 2 diabetes mellitus without complication (HCC) 02/17/2020    Essential hypertension 06/14/2018    Family history of malignant neoplasm of colon 06/14/2018    Hyperlipidemia 06/14/2018    Polycystic ovary syndrome 06/14/2018    Vitamin D deficiency 06/14/2018           Shirlean Kelly, APRN - CNP on 10/02/2022 at 7:05 AM

## 2022-10-02 NOTE — Anesthesia Pre-Procedure Evaluation (Signed)
Department of Anesthesiology  Preprocedure Note       Name:  Catherine Forbes   Age:  52 y.o.  DOB:  03/23/1971                                          MRN:  [id number]         Date:  10/02/2022      Surgeon: Moishe Spice):  Tery Sanfilippo, MD    Procedure: Procedure(s):  COLONOSCOPY DIAGNOSTIC    Medications prior to admission:   Prior to Admission medications    Medication Sig Start Date End Date Taking? Authorizing Provider   levothyroxine (SYNTHROID) 75 MCG tablet Take 1 tablet by mouth daily 09/29/22 03/28/23  Raymond Gurney, PA-C   lisinopril (PRINIVIL;ZESTRIL) 10 MG tablet Take 1 tablet by mouth daily 09/29/22 03/28/23  Raymond Gurney, PA-C   spironolactone-hydroCHLOROthiazide [facility]) 25-25 MG per tablet Take 1 tablet by mouth daily 09/29/22 03/28/23  Raymond Gurney, PA-C   Solriamfetol HCl 150 MG TABS 150 mg 06/28/22   [provider]   Semaglutide, 1 MG/DOSE, (OZEMPIC, 1 MG/DOSE,) 4 MG/3ML SOPN Inject 1 mg into the skin once a week 07/13/22   Calvert Cantor, DO   empagliflozin (JARDIANCE) 25 MG tablet Take 1 tablet by mouth daily 05/04/22   Marlou Starks, MD   rosuvastatin (CRESTOR) 10 MG tablet Take 1 tablet by mouth every other day 12/07/21 07/13/22  Marlou Starks, MD   Zinc 100 MG TABS Take 1 tablet by mouth daily    [provider]   vitamin D (D3-1000) 25 MCG (1000 UT) CAPS Take 1 capsule by mouth daily    [provider]       Current medications:    Current Facility-Administered Medications   Medication Dose Route Frequency Provider Last Rate Last Admin   . lidocaine PF 1 % injection 1 mL  1 mL IntraDERmal Once PRN Nwosu, Blessing, MD       . sodium chloride flush 0.9 % injection 5-40 mL  5-40 mL IntraVENous 2 times per day Nwosu, Blessing, MD       . sodium chloride flush 0.9 % injection 5-40 mL  5-40 mL IntraVENous PRN Nwosu, Blessing, MD       . 0.9 % sodium chloride infusion   IntraVENous PRN Nwosu, Blessing, MD       . 0.9 % sodium chloride infusion   IntraVENous  Continuous Nwosu, Blessing, MD           Allergies:  No Known Allergies    Problem List:    Patient Active Problem List   Diagnosis Code   . Essential hypertension I10   . Family history of malignant neoplasm of colon Z80.0   . Hyperlipidemia E78.5   . Polycystic ovary syndrome E28.2   . Vitamin D deficiency E55.9   . Obstructive sleep apnea G47.33   . Type 2 diabetes mellitus without complication (HCC) E11.9   . Carpal tunnel syndrome G56.00   . Other specified hypothyroidism E03.8   . Benign colon polyp K63.5   . Rectal bleeding K62.5   . Chronic diarrhea K52.9   . History of hemorrhoids Z87.19       Past Medical History:        Diagnosis Date   . Arthritis    . Bronchitis    . Carpal  tunnel syndrome on both sides    . Colon polyp    . Diabetes mellitus (HCC)    . Fatigue    . GERD (gastroesophageal reflux disease)    . Glucose intolerance    . History of blood transfusion 1998   . History of diverticulitis    . History of vitamin D deficiency    . Hyperlipidemia    . Hypertension    . IBS (irritable bowel syndrome)    . Insomnia    . Migraine    . Obesity    . OSA on CPAP    . PCOS (polycystic ovarian syndrome)    . PONV (postoperative nausea and vomiting)    . Thyroid disease    . Type 2 diabetes mellitus without complication (HCC)    . Under care of service provider 03/01/2022    pcp-[facility]-last visit aug 2023   . Under care of service provider 03/01/2022    sleep disorder-dr jacob[place]-last visit nov 2023   . Wears contact lenses        Past Surgical History:        Procedure Laterality Date   . BREAST CYST EXCISION Right     Age 5   . BREAST SURGERY Right     benign cyst removed    . CARPAL TUNNEL RELEASE Right 03/14/2022    CARPAL TUNNEL RELEASE performed by Lorne Skeens, MD at [facility] OR   . CHOLECYSTECTOMY     . COLONOSCOPY N/A 05/29/2019    COLONOSCOPY WITH BIOPSY performed by Tyna Jaksch, MD at [facility] OR   . FINGER FRACTURE SURGERY  1990   . FOOT FRACTURE SURGERY  2012   .  HERNIA REPAIR      umbilical   . HYSTERECTOMY, VAGINAL      pt has had 2 miscarriages, and a D+C also   . TONSILLECTOMY     . UPPER GASTROINTESTINAL ENDOSCOPY         Social History:    Social History     Tobacco Use   . Smoking status: Never   . Smokeless tobacco: Never   Substance Use Topics   . Alcohol use: Never                                Counseling given: Not Answered      Vital Signs (Current):   Vitals:    10/02/22 0740   BP: 123/82   Pulse: 92   Resp: 16   Temp: 98.6 F (37 C)   TempSrc: Infrared   SpO2: 96%   Weight: 95.3 kg (210 lb)   Height: 1.6 m (5\' 3" )                                              BP Readings from Last 3 Encounters:   10/02/22 123/82   07/31/22 128/83   07/13/22 118/86       NPO Status: Time of last liquid consumption: 2345                        Time of last solid consumption: 2200                        Date of last  liquid consumption: 10/01/22                        Date of last solid food consumption: 09/30/22    BMI:   Wt Readings from Last 3 Encounters:   10/02/22 95.3 kg (210 lb)   09/19/22 97.5 kg (215 lb)   07/31/22 100.2 kg (221 lb)     Body mass index is 37.2 kg/m.    CBC:   Lab Results   Component Value Date/Time    WBC 9.4 03/01/2022 10:35 AM    RBC 4.68 03/01/2022 10:35 AM    HGB 14.7 03/01/2022 10:35 AM    HCT 43.7 03/01/2022 10:35 AM    MCV 93.4 03/01/2022 10:35 AM    RDW 12.3 03/01/2022 10:35 AM    PLT 308 03/01/2022 10:35 AM       CMP:   Lab Results   Component Value Date/Time    NA 136 07/14/2022 07:56 AM    K 4.1 07/14/2022 07:56 AM    CL 101 07/14/2022 07:56 AM    CO2 23 07/14/2022 07:56 AM    BUN 12 07/14/2022 07:56 AM    CREATININE 0.7 07/14/2022 07:56 AM    GFRAA >60 05/15/2020 02:15 PM    LABGLOM >60 07/14/2022 07:56 AM    GLUCOSE 135 04/12/2022 08:33 AM    CALCIUM 9.4 07/14/2022 07:56 AM    BILITOT 0.2 07/14/2022 07:56 AM    ALKPHOS 63 07/14/2022 07:56 AM    AST 17 07/14/2022 07:56 AM    ALT 36 07/14/2022 07:56 AM       POC Tests: No results for input(s):  "POCGLU", "POCNA", "POCK", "POCCL", "POCBUN", "POCHEMO", "POCHCT" in the last 72 hours.    Coags: No results found for: "PROTIME", "INR", "APTT"    HCG (If Applicable): No results found for: "PREGTESTUR", "PREGSERUM", "HCG", "HCGQUANT"     ABGs: No results found for: "PHART", "PO2ART", "PCO2ART", "HCO3ART", "BEART", "O2SATART"     Type & Screen (If Applicable):  No results found for: "LABABO"    Drug/Infectious Status (If Applicable):  No results found for: "HIV", "HEPCAB"    COVID-19 Screening (If Applicable): No results found for: "COVID19"        Anesthesia Evaluation  Patient summary reviewed and Nursing notes reviewed   history of anesthetic complications: PONV.  Airway: Mallampati: III  TM distance: >3 FB   Neck ROM: full  Mouth opening: > = 3 FB   Dental: normal exam         Pulmonary:normal exam  breath sounds clear to auscultation  (+)     sleep apnea:                                  Cardiovascular:    (+) hypertension:, hyperlipidemia      ECG reviewed  Rhythm: regular  Rate: normal           Beta Blocker:  Not on Beta Blocker         Neuro/Psych:   (+) neuromuscular disease:, headaches:            GI/Hepatic/Renal:   (+) GERD:          Endo/Other:    (+) DiabetesType II DM, hypothyroidism::..                 Abdominal:  Vascular: negative vascular ROS.         Other Findings:       Anesthesia Plan      general     ASA 3       Induction: intravenous.    MIPS: Postoperative opioids intended and Prophylactic antiemetics administered.  Anesthetic plan and risks discussed with patient.      Plan discussed with CRNA.                Ritta Slot, MD   10/02/2022

## 2022-10-02 NOTE — Progress Notes (Signed)
Accucheck 145.

## 2022-10-02 NOTE — Discharge Instructions (Addendum)
DISCHARGE INSTRUCTIONS FOR COLONOSCOPY    In order to continue your care at home, please follow the instructions below.    For General Anesthesia:  Do not drink any alcoholic beverages or make any legal or important decisions for 24 hours.    Do not drive or operate machinery for 24 hours.  You may return to work after 24 hours.        Diet    Drink plenty of fluids after surgery, unless you are on a fluid restriction.   After general anesthesia, start out eating lightly (broth, soup, bread, etc.) advancing as tolerated to your usual diet.  Try to avoid spicy or greasy/fatty foods for 24 hours.   Avoid milk/milk product for several hours.       Medications  Take medications as ordered by your surgeon.    Activities  Limit your activities for 24 hours.  Walk around to help pass gas.  You may shower.    Call your surgeon for the following:  If you have abdominal pain that is not relieved by passing gas.   For an oral temperature (by mouth) is 101 degrees or higher, chills or excessive sweating.  You have increasing and progressive bleeding or drainage from surgery area.  Persistent nausea or vomiting  Rectal bleeding (may be red, maroon, or black) or change in your bowel habits.  Redness or swelling at the IV site.  If you are unable to urinate within 8 hours of surgery.  For any questions or concerns you may have.    Next screening colonoscopy: 3 years

## 2022-10-02 NOTE — Anesthesia Post-Procedure Evaluation (Signed)
Department of Anesthesiology  Postprocedure Note    Patient: Catherine Forbes  MRN: [id number]  Birthdate: April 29, 1970  Date of evaluation: 10/02/2022    Procedure Summary       Date: 10/02/22 Room / Location: [facility] ENDO 03 / [facility]    Anesthesia Start: 0845 Anesthesia Stop: 1003    Procedure: COLONOSCOPY DIAGNOSTIC (Rectum) Diagnosis:       Hx of colonic polyps      (Hx of colonic polyps [Z86.010])    Surgeons: Tery Sanfilippo, MD Responsible Provider: Ritta Slot, MD    Anesthesia Type: general ASA Status: 3            Anesthesia Type: No value filed.    Aldrete Phase I: Aldrete Score: 10    Aldrete Phase II: Aldrete Score: 10    Anesthesia Post Evaluation    Comments: POST- ANESTHESIA EVALUATION       Pt Name: Catherine Forbes  MRN: [id number]  Birthdate: 03-03-1971  Date of evaluation: 10/02/2022  Time:  2:44 PM      BP 111/74   Pulse 90   Temp 96.9 F (36.1 C) (Infrared)   Resp 12   Ht 1.6 m (5\' 3" )   Wt 95.3 kg (210 lb)   SpO2 96%   BMI 37.20 kg/m      Consciousness Level  Awake  Cardiopulmonary Status  Stable  Pain Adequately Treated YES  Nausea / Vomiting  NO  Adequate Hydration  YES  Anesthesia Related Complications NONE      Electronically signed by Ritta Slot, MD on 10/02/2022 at 2:44 PM           No notable events documented.

## 2022-10-13 ENCOUNTER — Inpatient Hospital Stay: Payer: PRIVATE HEALTH INSURANCE | Primary: Family Medicine

## 2022-10-13 ENCOUNTER — Ambulatory Visit
Admit: 2022-10-13 | Discharge: 2022-10-13 | Payer: PRIVATE HEALTH INSURANCE | Attending: Family Medicine | Primary: Family Medicine

## 2022-10-13 DIAGNOSIS — I10 Essential (primary) hypertension: Secondary | ICD-10-CM

## 2022-10-13 DIAGNOSIS — E119 Type 2 diabetes mellitus without complications: Secondary | ICD-10-CM

## 2022-10-13 LAB — HEMOGLOBIN AND HEMATOCRIT
Hematocrit: 43.6 % (ref 36.3–47.1)
Hemoglobin: 14.4 g/dL (ref 11.9–15.1)

## 2022-10-13 LAB — HEMOGLOBIN A1C
Estimated Avg Glucose: 143 mg/dL
Hemoglobin A1C: 6.6 % — ABNORMAL HIGH (ref 4.0–6.0)

## 2022-10-13 LAB — POCT GLYCOSYLATED HEMOGLOBIN (HGB A1C)

## 2022-10-13 NOTE — Progress Notes (Unsigned)
[facility]  [address]   [place], [place] [address]  Phone: [phone]       Name: Catherine Forbes  DOB: 05/14/1970     Chief Complaint:    Catherine Forbes is a 52 y.o. year old female who presents today for No chief complaint on file.      History of Present Illness:    Diabetes Mellitus Type II, Follow-up: Patient is here for follow-up of Type 2 diabetes mellitus.  Last A1c was very close to goal so no medications were adjusted. She is on ozempic 1mg , jardiance 25mg .       Hemoglobin A1C (%)   Date Value   07/13/2022 7.1   03/06/2022 7.7   12/07/2021 6.2        Medications:    Outpatient Medications Prior to Visit   Medication Sig Dispense Refill    levothyroxine (SYNTHROID) 75 MCG tablet Take 1 tablet by mouth daily 90 tablet 1    lisinopril (PRINIVIL;ZESTRIL) 10 MG tablet Take 1 tablet by mouth daily 90 tablet 1    spironolactone-hydroCHLOROthiazide (ALDACTAZIDE) 25-25 MG per tablet Take 1 tablet by mouth daily 90 tablet 1    Solriamfetol HCl 150 MG TABS 150 mg      Semaglutide, 1 MG/DOSE, (OZEMPIC, 1 MG/DOSE,) 4 MG/3ML SOPN Inject 1 mg into the skin once a week 3 mL 3    empagliflozin (JARDIANCE) 25 MG tablet Take 1 tablet by mouth daily 90 tablet 1    rosuvastatin (CRESTOR) 10 MG tablet Take 1 tablet by mouth every other day 45 tablet 1    Zinc 100 MG TABS Take 1 tablet by mouth daily      vitamin D (D3-1000) 25 MCG (1000 UT) CAPS Take 1 capsule by mouth daily       No facility-administered medications prior to visit.       Review of Systems:     Review of Systems     Physical Exam:     Vitals:  There were no vitals taken for this visit. There is no height or weight on file to calculate BMI.    Physical Exam    Assessment:     {No diagnosis found. (Refresh or delete this SmartLink)}          Plan:     {There are no diagnoses linked to this encounter. (Refresh or delete this SmartLink)}    MDM: ***     No follow-ups on file.  No orders of the defined types were placed in this  encounter.    No orders of the defined types were placed in this encounter.          I reviewed the above assessment and plan with [facility].  Questions were answered and it appears that the [name] has a good understanding of the visit today.    I would like to see [name] back in No follow-ups on file. , or sooner if any new issues occur.    Electronically signed by Calvert Cantor, DO on 10/12/2022 at 8:30 AM

## 2022-10-16 ENCOUNTER — Ambulatory Visit
Admit: 2022-10-16 | Discharge: 2022-10-16 | Payer: PRIVATE HEALTH INSURANCE | Attending: Registered Nurse | Primary: Family Medicine

## 2022-10-16 DIAGNOSIS — Z86010 Personal history of colonic polyps: Secondary | ICD-10-CM

## 2022-10-16 NOTE — Progress Notes (Signed)
[facility]   Tyna Jaksch, MD, FACS  Cristy Hilts. Marina Goodell, APRN-CNP  [address]  [place], [place] [address]  P: [phone], F: [phone]    General and Robotic Surgery  Endoscopy Follow-Up Visit Note               PATIENT NAME: Catherine Forbes   DOB:  14-Aug-1970   MRN: [id number]   PCP:  Calvert Cantor, DO     TODAY'S DATE: 10/16/2022    Chief Complaint   Patient presents with    Follow-up     f/u cscope 6/10       HISTORY OF PRESENT ILLNESS: 52 y.o. female presents for follow up on colonoscopy findings completed on 10-02-22 for hx of colon polyps. Fair prep noted.  Positive family history of colon cancer.  History of IBS.  Patient tolerated procedure well, no rectal bleeding or pain noted after procedure. Patient states she was told she had a difficult intubation, had a swollen lip, cut on her lip/gum, sore throat. No prior issues with intubation.    Additionally, patient states history of diastasis recti.  Sometimes notes abdominal pain.  Prior umbilical hernia repair sometime around 2004.  Unsure if mesh was used.    Findings per Dr. Noel Christmas procedure note are reviewed below:  Terminal ileum: normal     Cecum/Ascending colon: normal     Transverse colon: normal     Descending/Sigmoid colon: normal     Rectum/Anus: examined in normal and retroflexed positions and was normal    PAST MEDICAL HISTORY     Past Medical History:   Diagnosis Date    Arthritis     Bronchitis     Carpal tunnel syndrome 03/14/2022    Carpal tunnel syndrome on both sides     Colon polyp     Diabetes mellitus (HCC)     Fatigue     GERD (gastroesophageal reflux disease)     Glucose intolerance     History of blood transfusion 1998    History of diverticulitis     History of vitamin D deficiency     Hyperlipidemia     Hypertension     IBS (irritable bowel syndrome)     Insomnia     Migraine     Obesity     OSA on CPAP     PCOS (polycystic ovarian syndrome)     PONV (postoperative nausea and  vomiting)     Thyroid disease     Type 2 diabetes mellitus without complication (HCC)     Under care of service provider 03/01/2022    pcp-[facility]-[place]-last visit aug 2023    Under care of service provider 03/01/2022    sleep disorder-dr jacob-[place]-last visit nov 2023    Wears contact lenses        PROBLEM LIST     Patient Active Problem List   Diagnosis    Essential hypertension    Hyperlipidemia    Vitamin D deficiency    Obstructive sleep apnea    Type 2 diabetes mellitus without complication (HCC)    Other specified hypothyroidism    Abdominal wall bulge    History of IBS    History of colon polyps       SURGICAL HISTORY       Past Surgical History:   Procedure Laterality Date    BREAST CYST EXCISION Right     Age 40    BREAST SURGERY  Right     benign cyst removed     CARPAL TUNNEL RELEASE Right 03/14/2022    CARPAL TUNNEL RELEASE performed by Lorne Skeens, MD at [facility] OR    CHOLECYSTECTOMY      COLONOSCOPY N/A 05/29/2019    COLONOSCOPY WITH BIOPSY performed by Tyna Jaksch, MD at [facility] OR    COLONOSCOPY N/A 10/02/2022    COLONOSCOPY DIAGNOSTIC performed by Tery Sanfilippo, MD at [facility] ENDO    FINGER FRACTURE SURGERY  1990    FOOT FRACTURE SURGERY  2012    HERNIA REPAIR      umbilical    HYSTERECTOMY, VAGINAL      pt has had 2 miscarriages, and a D+C also    TONSILLECTOMY      UPPER GASTROINTESTINAL ENDOSCOPY         ALLERGIES   No Known Allergies    FAMILY HISTORY   family history includes COPD in her father; Colon Cancer in her mother; Dementia in her mother; Diabetes in her mother; Other in her father and mother.    SOCIAL HISTORY    reports that she has never smoked. She has never used smokeless tobacco. She reports that she does not drink alcohol and does not use drugs.    MEDICATIONS     Current Outpatient Medications:     triamcinolone (KENALOG) 0.1 % ointment, Apply 1 Application topically 2 times daily, Disp: , Rfl:     metFORMIN, MOD, (GLUMETZA) 500 MG extended release tablet, Take  2 tablets twice a day by oral route., Disp: , Rfl:     levothyroxine (SYNTHROID) 75 MCG tablet, Take 1 tablet by mouth daily, Disp: 90 tablet, Rfl: 1    lisinopril (PRINIVIL;ZESTRIL) 10 MG tablet, Take 1 tablet by mouth daily, Disp: 90 tablet, Rfl: 1    spironolactone-hydroCHLOROthiazide (ALDACTAZIDE) 25-25 MG per tablet, Take 1 tablet by mouth daily, Disp: 90 tablet, Rfl: 1    Solriamfetol HCl 150 MG TABS, 150 mg, Disp: , Rfl:     Semaglutide, 1 MG/DOSE, (OZEMPIC, 1 MG/DOSE,) 4 MG/3ML SOPN, Inject 1 mg into the skin once a week, Disp: 3 mL, Rfl: 3    empagliflozin (JARDIANCE) 25 MG tablet, Take 1 tablet by mouth daily, Disp: 90 tablet, Rfl: 1    rosuvastatin (CRESTOR) 10 MG tablet, Take 1 tablet by mouth every other day, Disp: 45 tablet, Rfl: 1    Zinc 100 MG TABS, Take 1 tablet by mouth daily, Disp: , Rfl:     vitamin D (D3-1000) 25 MCG (1000 UT) CAPS, Take 1 capsule by mouth daily, Disp: , Rfl:        Review of Systems   Constitutional:  Negative for chills and fever.   HENT:  Negative for congestion, rhinorrhea and sore throat.    Respiratory:  Negative for cough and shortness of breath.    Cardiovascular:  Negative for chest pain.   Gastrointestinal:         See HPI.   Genitourinary: Negative.    Skin: Negative.        VITALS:  BP 128/84   Pulse 100   Resp 16   Ht 1.6 m (5\' 3" )   Wt 97.5 kg (215 lb)   SpO2 97%   BMI 38.09 kg/m       Physical Exam  Vitals reviewed.   Constitutional:       Appearance: She is not ill-appearing or toxic-appearing.   HENT:      Head: Normocephalic and  atraumatic.      Right Ear: External ear normal.      Left Ear: External ear normal.      Nose: Nose normal.   Eyes:      General:         Right eye: No discharge.         Left eye: No discharge.      Conjunctiva/sclera: Conjunctivae normal.   Neck:      Trachea: No tracheal deviation.   Cardiovascular:      Rate and Rhythm: Normal rate.   Pulmonary:      Effort: No accessory muscle usage or respiratory distress.   Abdominal:       General: Bowel sounds are normal. There is no distension.      Palpations: Abdomen is soft.      Tenderness: There is no abdominal tenderness.      Comments: Significant diastasis recti noted to mid upper abdomen, tender.  No obvious hernia palpated.  Normal overlying skin.   Skin:     General: Skin is warm and dry.   Neurological:      Mental Status: She is alert and oriented to person, place, and time.   Psychiatric:         Mood and Affect: Mood normal.         Speech: Speech normal.         Behavior: Behavior normal.                Data  Lab Results   Component Value Date    WBC 9.4 03/01/2022    HGB 14.4 10/13/2022    HCT 43.6 10/13/2022    MCV 93.4 03/01/2022    PLT 308 03/01/2022     Lab Results   Component Value Date    NA 136 07/14/2022    K 4.1 07/14/2022    CL 101 07/14/2022    CO2 23 07/14/2022    BUN 12 07/14/2022    CREATININE 0.7 07/14/2022    GLUCOSE 135 (H) 04/12/2022    CALCIUM 9.4 07/14/2022    BILITOT 0.2 07/14/2022    ALKPHOS 63 07/14/2022    AST 17 07/14/2022    ALT 36 (H) 07/14/2022    LABGLOM >60 07/14/2022    GFRAA >60 05/15/2020           Radiology Review:    CT Result (most recent):  CT CHEST PULMONARY EMBOLISM W CONTRAST 05/15/2020    Narrative  EXAMINATION:  CTA OF THE CHEST 05/15/2020 3:17 pm    TECHNIQUE:  CTA of the chest was performed after the administration of intravenous  contrast.  Multiplanar reformatted images are provided for review.  MIP  images are provided for review. Dose modulation, iterative reconstruction,  and/or weight based adjustment of the mA/kV was utilized to reduce the  radiation dose to as low as reasonably achievable.    COMPARISON:  None.    HISTORY:  ORDERING SYSTEM PROVIDED HISTORY: chest pain, elevated dimer  TECHNOLOGIST PROVIDED HISTORY:  chest pain, elevated dimer  Decision Support Exception - unselect if not a suspected or confirmed  emergency medical condition->Emergency Medical Condition (MA)  Reason for Exam: chest pain, elevated dimer.  Relevant  Medical/Surgical History: HTN, DM2    FINDINGS:  Pulmonary Arteries: Pulmonary arteries are adequately opacified for  evaluation.  No evidence of intraluminal filling defect to suggest pulmonary  embolism.  Main pulmonary artery is normal in caliber.    Mediastinum: No evidence of  mediastinal lymphadenopathy.  The heart and  pericardium demonstrate no acute abnormality.  There is no acute abnormality  of the thoracic aorta.    Lungs/pleura: The lungs are without acute process.  No focal consolidation or  pulmonary edema.  No evidence of pleural effusion or pneumothorax.    Upper Abdomen: Limited images of the upper abdomen are unremarkable.    Soft Tissues/Bones: No acute bone or soft tissue abnormality.  Multiple  chronic fracture deformities of lateral aspect of the mid left  rib cage.    Impression  No evidence of pulmonary embolism or acute pulmonary abnormality.    Multiple chronic fracture deformities of left rib cage.    RECOMMENDATIONS:  Unavailable        ASSESSMENT   52 y.o. female s/p colonoscopy with fair prep-patient is diabetic  Bowels are at baseline, no bloody/black, tarry stools  No gross lesions seen  Difficult intubation   Remote hx of colon polyps  Hx of IBS  Family hx of colon CA  History of diastasis recti, prior umbilical hernia repair  Additional prior abdominal/pelvic surgeries include laparoscopic cholecystectomy, hysterectomy    PLAN  Colonoscopy findings and pathology thoroughly discussed with patient  Patient will be due for next colonoscopy in 3 years or sooner if needed, which will be June 2027- stressed importance of re-scheduling next colonoscopy-will plan for 2-day prep  High fiber diet with adequate water intake encouraged to avoid constipation/straining r/t diverticulosis   Will re-schedule f/u appointment in the office approximately 6 months prior to next recommended colonoscopy to discuss prep/procedure instructions, and review symptoms  Will call patient with CT abdomen pelvis  results, depending on results may need office follow-up, call if symptoms worsen or change at any time  Patient is agreeable to treatment plan    ENCOUNTER DIAGNOSES    ICD-10-CM    1. History of colon polyps  Z86.010       2. Status post colonoscopy  Z98.890       3. Family history of malignant neoplasm of colon  Z80.0       4. History of IBS  Z87.19       5. Abdominal wall bulge  R19.00 CT ABDOMEN PELVIS W IV CONTRAST      6. Diastasis recti  M62.08 CT ABDOMEN PELVIS W IV CONTRAST          Return for F/u next colonoscopy in 3 years, sooner if needed, will call with CT scan results.    The patient, Catherine Forbes is a 52 y.o. female, was seen with a total time spent of 35 minutes for the visit on this date of service by the E/M provider. The time component had both face to face and non face to face time spent in determining the total time component.  Counseling and education regarding the patient's diagnosis listed below and the patient's options regarding those diagnoses were also included in determining the time component.      Electronically signed by Minette Headland, APRN - CNP  on 10/16/2022 at 10:53 AM

## 2022-10-18 ENCOUNTER — Encounter

## 2022-10-18 MED ORDER — OZEMPIC (1 MG/DOSE) 4 MG/3ML SC SOPN
43 MG/3ML | SUBCUTANEOUS | 1 refills | Status: AC
Start: 2022-10-18 — End: ?

## 2022-10-18 NOTE — Telephone Encounter (Signed)
Last appointment: 10/13/2022  Next appointment: 01/15/2023

## 2022-10-25 ENCOUNTER — Encounter: Payer: PRIVATE HEALTH INSURANCE | Attending: Surgery | Primary: Family Medicine

## 2022-11-08 ENCOUNTER — Inpatient Hospital Stay: Payer: PRIVATE HEALTH INSURANCE | Primary: Family Medicine

## 2022-11-08 ENCOUNTER — Ambulatory Visit
Admit: 2022-11-08 | Discharge: 2022-11-08 | Payer: PRIVATE HEALTH INSURANCE | Attending: Registered Nurse | Primary: Family Medicine

## 2022-11-08 ENCOUNTER — Inpatient Hospital Stay: Admit: 2022-11-08 | Payer: PRIVATE HEALTH INSURANCE | Primary: Family Medicine

## 2022-11-08 DIAGNOSIS — R1032 Left lower quadrant pain: Secondary | ICD-10-CM

## 2022-11-08 DIAGNOSIS — N6311 Unspecified lump in the right breast, upper outer quadrant: Secondary | ICD-10-CM

## 2022-11-08 LAB — CBC WITH AUTO DIFFERENTIAL
Basophils %: 1 % (ref 0–2)
Basophils Absolute: 0.2 10*3/uL (ref 0.0–0.2)
Eosinophils %: 2 % (ref 1–4)
Eosinophils Absolute: 0.2 10*3/uL (ref 0.0–0.4)
Hematocrit: 43.7 % (ref 36–46)
Hemoglobin: 14.5 g/dL (ref 12.0–16.0)
Lymphocytes %: 20 % — ABNORMAL LOW (ref 24–44)
Lymphocytes Absolute: 2.7 10*3/uL (ref 1.0–4.8)
MCH: 30.9 pg (ref 26–34)
MCHC: 33.2 g/dL (ref 31–37)
MCV: 92.9 fL (ref 80–100)
MPV: 7.5 fL (ref 6.0–12.0)
Monocytes %: 6 % (ref 2–11)
Monocytes Absolute: 0.9 10*3/uL (ref 0.1–1.2)
Neutrophils %: 71 % — ABNORMAL HIGH (ref 36–66)
Neutrophils Absolute: 9.5 10*3/uL — ABNORMAL HIGH (ref 1.8–7.7)
Platelets: 368 10*3/uL (ref 140–450)
RBC: 4.71 m/uL (ref 4.0–5.2)
RDW: 13.2 % (ref 12.5–15.4)
WBC: 13.4 10*3/uL — ABNORMAL HIGH (ref 3.5–11.0)

## 2022-11-08 LAB — COMPREHENSIVE METABOLIC PANEL
ALT: 24 U/L (ref 5–33)
AST: 17 U/L (ref <32)
Albumin/Globulin Ratio: 1.4 (ref 1.0–2.5)
Albumin: 4.7 g/dL (ref 3.5–5.2)
Alkaline Phosphatase: 75 U/L (ref 35–104)
Anion Gap: 12 mmol/L (ref 9–17)
BUN: 12 mg/dL (ref 6–20)
CO2: 27 mmol/L (ref 20–31)
Calcium: 10.1 mg/dL (ref 8.6–10.4)
Chloride: 95 mmol/L — ABNORMAL LOW (ref 98–107)
Creatinine: 0.7 mg/dL (ref 0.5–0.9)
Est, Glom Filt Rate: >90 mL/min/{1.73_m2} (ref >60)
Glucose: 116 mg/dL — ABNORMAL HIGH (ref 70–99)
Potassium: 3.6 mmol/L — ABNORMAL LOW (ref 3.7–5.3)
Sodium: 134 mmol/L — ABNORMAL LOW (ref 135–144)
Total Bilirubin: 0.3 mg/dL (ref 0.3–1.2)
Total Protein: 8 g/dL (ref 6.4–8.3)

## 2022-11-08 LAB — C-REACTIVE PROTEIN: CRP: 10.5 mg/L — ABNORMAL HIGH (ref 0.0–5.0)

## 2022-11-08 MED ORDER — IOPAMIDOL 76 % IV SOLN
76 | Freq: Once | INTRAVENOUS | Status: AC | PRN
Start: 2022-11-08 — End: 2022-11-08
  Administered 2022-11-08: 22:00:00 75 mL via INTRAVENOUS

## 2022-11-08 MED ORDER — SODIUM CHLORIDE 0.9 % IV BOLUS
0.9 | Freq: Once | INTRAVENOUS | Status: AC
Start: 2022-11-08 — End: 2022-11-08
  Administered 2022-11-08: 22:00:00 80 mL via INTRAVENOUS

## 2022-11-08 MED ORDER — NORMAL SALINE FLUSH 0.9 % IV SOLN
0.9 % | INTRAVENOUS | Status: AC | PRN
Start: 2022-11-08 — End: 2022-11-11
  Administered 2022-11-08: 22:00:00 10 mL via INTRAVENOUS

## 2022-11-08 NOTE — Patient Instructions (Signed)
Have mammogram completed at earliest convenience.   Will call with results.  Follow-up with PCP.

## 2022-11-08 NOTE — Progress Notes (Signed)
[facility]  [facility] [place] [facility]  [address]  [place] [address]    [facility], [facility] [place] [facility]  [name]  [place] [address]  Dept: [phone]    HENYA AGUALLO is a 52 y.o. female Established patient, who presents to the walk-in clinic today with conditions/complaints as noted below:    Chief Complaint   Patient presents with    Lymphadenopathy     Swelling under R arm and now feeling it in the upper R side of chest and has some tenderness X 3 days    Abdominal Pain     L lower abd pain-not sure if related- states this comes and goes but tender          HPI:     Patient is a 52 year old female that presents today with multiple concerns. Notes that she started experiencing right-sided chest pain/discomfort Sunday evening. No precipitating chest wall injury. The following day she noticed a lump; she's unsure how long it's been present. She has a mammogram ordered but hasn't called to schedule it yet. Describes pulled muscle sensation and rates it 5/10. It's worse with movement and radiates midline. No associated heart palpitations or difficulty breathing. Denies fevers, chills, night sweats, or significant weight loss. She applied [facility] the first two nights without improvement. States that she's also been experiencing intermittent LLQ abdominal pain the past two days. Pertinent PMH includes IBS; endorses chronic diarrhea. Reports occasional nausea; no vomiting. No bloody stools. Her appetite has been decreased. Denies dysuria, hematuria, or notable increase in frequency. No back pain.        Past Medical History:   Diagnosis Date    Arthritis     Bronchitis     Carpal tunnel syndrome 03/14/2022    Carpal tunnel syndrome on both sides     Colon polyp     Diabetes mellitus (HCC)     Fatigue     GERD (gastroesophageal reflux disease)     Glucose intolerance     History of blood transfusion 1998     History of diverticulitis     History of vitamin D deficiency     Hyperlipidemia     Hypertension     IBS (irritable bowel syndrome)     Insomnia     Migraine     Obesity     OSA on CPAP     PCOS (polycystic ovarian syndrome)     PONV (postoperative nausea and vomiting)     Thyroid disease     Type 2 diabetes mellitus without complication (HCC)     Under care of service provider 03/01/2022    pcp-[facility]-last visit aug 2023    Under care of service provider 03/01/2022    sleep disorder-dr jacob[place]-last visit nov 2023    Wears contact lenses        Current Outpatient Medications   Medication Sig Dispense Refill    Clobetasol Propionate 0.05 % SHAM WORK INTO A DAMPENED SCALP, LEAVE ON 5 MIN, WASH OFF. USE 2-3 TIMES WEEKLY AS NEEDED FOR FLARES      metroNIDAZOLE (METROGEL) 1 % gel APPLY ONCE A DAY TO FACE      Semaglutide, 1 MG/DOSE, (OZEMPIC, 1 MG/DOSE,) 4 MG/3ML SOPN sc injection INJECT 1MG  INTO THE SKIN ONCE A WEEK 9 Adjustable Dose Pre-filled Pen Syringe 1    triamcinolone (KENALOG) 0.1 % ointment Apply 1 Application topically 2  times daily      metFORMIN, MOD, (GLUMETZA) 500 MG extended release tablet Take 2 tablets twice a day by oral route.      levothyroxine (SYNTHROID) 75 MCG tablet Take 1 tablet by mouth daily 90 tablet 1    lisinopril (PRINIVIL;ZESTRIL) 10 MG tablet Take 1 tablet by mouth daily 90 tablet 1    spironolactone-hydroCHLOROthiazide (ALDACTAZIDE) 25-25 MG per tablet Take 1 tablet by mouth daily 90 tablet 1    Solriamfetol HCl 150 MG TABS 150 mg      empagliflozin (JARDIANCE) 25 MG tablet Take 1 tablet by mouth daily 90 tablet 1    rosuvastatin (CRESTOR) 10 MG tablet Take 1 tablet by mouth every other day 45 tablet 1    Zinc 100 MG TABS Take 1 tablet by mouth daily      vitamin D (D3-1000) 25 MCG (1000 UT) CAPS Take 1 capsule by mouth daily       No current facility-administered medications for this visit.       No Known Allergies    :     Review of Systems   Constitutional:  Positive  for appetite change (decreased). Negative for chills, diaphoresis, fever and unexpected weight change.   Respiratory:  Negative for cough and shortness of breath.    Cardiovascular:  Positive for chest pain (right-sided). Negative for palpitations.   Gastrointestinal:  Positive for abdominal pain (LLQ), diarrhea (chronic) and nausea (intermittent). Negative for blood in stool and vomiting.   Genitourinary:  Positive for frequency (chronic). Negative for dysuria and hematuria.   Musculoskeletal:  Negative for back pain.   Skin:  Negative for rash.   Neurological:  Negative for syncope and light-headedness.       :     BP 112/72 (Site: Left Upper Arm, Position: Sitting, Cuff Size: Small Adult)   Pulse 100   Temp 98.1 F (36.7 C)   Ht 1.6 m (5\' 3" )   Wt 94.8 kg (209 lb)   SpO2 97%   BMI 37.02 kg/m     Physical Exam  Vitals and nursing note reviewed. Exam conducted with a chaperone present.   Constitutional:       General: She is not in acute distress.     Appearance: Normal appearance. She is obese. She is not ill-appearing or toxic-appearing.   HENT:      Head: Normocephalic and atraumatic.   Cardiovascular:      Rate and Rhythm: Normal rate and regular rhythm.      Heart sounds: Normal heart sounds.   Pulmonary:      Effort: Pulmonary effort is normal.      Breath sounds: Normal breath sounds.   Chest:   Breasts:     Right: Mass and tenderness present. No swelling or skin change.      Comments: Golf ball size, tender, semi-firm, mobile mass in upper outer quadrant of right breast; no appreciable swelling, warmth, or erythema  Abdominal:      General: Abdomen is protuberant. Bowel sounds are normal. There is no distension.      Palpations: Abdomen is soft.      Tenderness: There is abdominal tenderness in the left lower quadrant. There is no guarding.   Musculoskeletal:      Cervical back: Neck supple.   Lymphadenopathy:      Upper Body:      Right upper body: No supraclavicular or axillary adenopathy.    Skin:     General: Skin is warm  and dry.   Neurological:      Mental Status: She is alert and oriented to person, place, and time.   Psychiatric:         Mood and Affect: Mood normal.         Behavior: Behavior normal.           :     Assessment & Plan     1. Mass of upper outer quadrant of right breast  2. Left lower quadrant abdominal pain  -     CBC with Auto Differential; Future  -     Comprehensive Metabolic Panel; Future  -     Urinalysis with Reflex to Culture; Future  -     C-Reactive Protein; Future  -     CT ABDOMEN PELVIS W IV CONTRAST Additional Contrast? None; Future  -     BUN & Creatinine; Future       :      No follow-ups on file.    No orders of the defined types were placed in this encounter.     Strongly encouraged to have mammogram completed at earliest convenience for further evaluation.  Lab work ordered.  STAT imaging ordered and scheduled in office; will call with results.  Continue with supportive treatment measures at this time.  Use acetaminophen or ibuprofen as needed for pain/discomfort.  Advised to go to the ER immediately for worsening symptoms.  Reinforced close follow-up with PCP.       Patient and/or parent given educational materials - see patient instructions.  Discussed use, benefit, and side effects of prescribed medications.  All patient questions answered.  Patient and/or parent voiced understanding.      Electronically signed by Constance Haw, APRN - CNPon 11/08/2022 at 4:57 PM

## 2022-11-09 ENCOUNTER — Telehealth

## 2022-11-09 LAB — URINALYSIS WITH REFLEX TO CULTURE
Bilirubin, Urine: NEGATIVE
Ketones, Urine: NEGATIVE mg/dL
Leukocyte Esterase, Urine: NEGATIVE
Nitrite, Urine: NEGATIVE
Protein, UA: NEGATIVE mg/dL
Specific Gravity, UA: 1.027 (ref 1.005–1.030)
Urine Hgb: NEGATIVE
Urobilinogen, Urine: NORMAL EU/dL (ref 0.0–1.0)
pH, Urine: 6 (ref 5.0–8.0)

## 2022-11-09 NOTE — Telephone Encounter (Addendum)
Patient inquiring about getting an updated mammogram order from Dr. Luther Bradley as yesterday during her exam with [facility] in walk in there was a notable lump in one of her breast. CT that she had preformed yesterday was normal. Blood work did show possible infection.   Patient requesting the order be changed to STAT as she is quite worried.

## 2022-11-10 NOTE — Telephone Encounter (Signed)
error 

## 2022-11-13 NOTE — Telephone Encounter (Signed)
Patient requesting order be changed to a diagnostic mam.

## 2022-11-13 NOTE — Telephone Encounter (Signed)
Signed.

## 2022-11-13 NOTE — Telephone Encounter (Signed)
She already has a screening mam order in the system. So I'm not sure what else is needed? I cannot make those types of orders stat. She just needs to call to get earliest available appt

## 2022-11-13 NOTE — Addendum Note (Signed)
Addended by: Lynnda Shields on: 11/13/2022 10:41 AM     Modules accepted: Orders

## 2022-11-27 ENCOUNTER — Ambulatory Visit
Admit: 2022-11-27 | Discharge: 2022-11-27 | Payer: PRIVATE HEALTH INSURANCE | Attending: Family Medicine | Primary: Family Medicine

## 2022-11-27 DIAGNOSIS — N6311 Unspecified lump in the right breast, upper outer quadrant: Secondary | ICD-10-CM

## 2022-11-27 NOTE — Progress Notes (Unsigned)
[facility]  [address]   [place], [place] [address]  Phone: [phone]       Name: Catherine Forbes  DOB: 22-Dec-1970     Chief Complaint:    Catherine Forbes is a 52 y.o. year old female who presents today for No chief complaint on file.      History of Present Illness:    Breast lump - she has mam scheduled for 8/6.   Had CT done for abdominal pain. It was normal. Did show some diverticulosis.     Medications:    Outpatient Medications Prior to Visit   Medication Sig Dispense Refill    Clobetasol Propionate 0.05 % SHAM WORK INTO A DAMPENED SCALP, LEAVE ON 5 MIN, WASH OFF. USE 2-3 TIMES WEEKLY AS NEEDED FOR FLARES      metroNIDAZOLE (METROGEL) 1 % gel APPLY ONCE A DAY TO FACE      Semaglutide, 1 MG/DOSE, (OZEMPIC, 1 MG/DOSE,) 4 MG/3ML SOPN sc injection INJECT 1MG  INTO THE SKIN ONCE A WEEK 9 Adjustable Dose Pre-filled Pen Syringe 1    triamcinolone (KENALOG) 0.1 % ointment Apply 1 Application topically 2 times daily      metFORMIN, MOD, (GLUMETZA) 500 MG extended release tablet Take 2 tablets twice a day by oral route.      levothyroxine (SYNTHROID) 75 MCG tablet Take 1 tablet by mouth daily 90 tablet 1    lisinopril (PRINIVIL;ZESTRIL) 10 MG tablet Take 1 tablet by mouth daily 90 tablet 1    spironolactone-hydroCHLOROthiazide (ALDACTAZIDE) 25-25 MG per tablet Take 1 tablet by mouth daily 90 tablet 1    Solriamfetol HCl 150 MG TABS 150 mg      empagliflozin (JARDIANCE) 25 MG tablet Take 1 tablet by mouth daily 90 tablet 1    rosuvastatin (CRESTOR) 10 MG tablet Take 1 tablet by mouth every other day 45 tablet 1    Zinc 100 MG TABS Take 1 tablet by mouth daily      vitamin D (D3-1000) 25 MCG (1000 UT) CAPS Take 1 capsule by mouth daily       No facility-administered medications prior to visit.       Review of Systems:     Review of Systems     Physical Exam:     Vitals:  There were no vitals taken for this visit. There is no height or weight on file to calculate BMI.    Physical Exam    Assessment:      {No diagnosis found. (Refresh or delete this SmartLink)}          Plan:     {There are no diagnoses linked to this encounter. (Refresh or delete this SmartLink)}    MDM: ***     No follow-ups on file.  No orders of the defined types were placed in this encounter.    No orders of the defined types were placed in this encounter.          I reviewed the above assessment and plan with [facility].  Questions were answered and it appears that the [facility] has a good understanding of the visit today.    I would like to see Meli back in No follow-ups on file. , or sooner if any new issues occur.    Electronically signed by Calvert Cantor, DO on 11/24/2022 at 11:30 AM

## 2022-11-28 ENCOUNTER — Ambulatory Visit: Payer: PRIVATE HEALTH INSURANCE | Primary: Family Medicine

## 2022-11-28 ENCOUNTER — Encounter

## 2022-11-28 DIAGNOSIS — N6311 Unspecified lump in the right breast, upper outer quadrant: Secondary | ICD-10-CM

## 2022-11-28 DIAGNOSIS — R928 Other abnormal and inconclusive findings on diagnostic imaging of breast: Secondary | ICD-10-CM

## 2022-11-29 ENCOUNTER — Encounter

## 2022-11-29 NOTE — Telephone Encounter (Signed)
Catherine Forbes is requesting a refill on the following medication(s):  Requested Prescriptions     Pending Prescriptions Disp Refills    empagliflozin (JARDIANCE) 25 MG tablet 90 tablet 1     Sig: Take 1 tablet by mouth daily       Last Visit Date (If Applicable):  11/27/2022    Next Visit Date:    01/15/2023

## 2022-11-30 MED ORDER — EMPAGLIFLOZIN 25 MG PO TABS
25 MG | ORAL_TABLET | Freq: Every day | ORAL | 1 refills | Status: AC
Start: 2022-11-30 — End: 2023-01-15

## 2022-12-29 ENCOUNTER — Institutional Professional Consult (permissible substitution)
Admit: 2022-12-29 | Discharge: 2022-12-29 | Payer: PRIVATE HEALTH INSURANCE | Attending: Medical Oncology | Primary: Family Medicine

## 2022-12-29 VITALS — BP 144/92 | HR 93 | Temp 97.80000°F | Resp 18 | Ht 63.0 in | Wt 214.2 lb

## 2022-12-29 DIAGNOSIS — N6331 Unspecified lump in axillary tail of the right breast: Secondary | ICD-10-CM

## 2022-12-29 NOTE — Progress Notes (Signed)
 _           [name] is a very pleasant 52 y.o. female with history of multiple co morbidities as listed.  Patient is referred for evaluation further management of leukocytosis.  The patient had significant problems with tiredness and fatigue.  She has history of sleep apnea.  She has no fever or night sweats.  She describes hot flashes.  She also has irritable bowel syndrome.  The patient was noted to have elevated white blood cells.  She had fluctuating white blood cells over the last several years.  She had normal differential.  Patient had palpable lump in the right axilla.  Recent diagnostic mammogram showed no suspicious lymphadenopathy or masses.  She continues to have a lump in the right breast tail about 3 to 4 cm in size.  Patient denies any other palpable lymph nodes.  No weight loss or decreased appetite.  No other complaints.  Patient denies smoking or alcohol drinking.[name]       PAST MEDICAL HISTORY: has a past medical history of Arthritis, Breast cyst, Bronchitis, Carpal tunnel syndrome, Carpal tunnel syndrome on both sides, Colon polyp, Diabetes mellitus (HCC), Fatigue, GERD (gastroesophageal reflux disease), Glucose intolerance, History of blood transfusion, History of diverticulitis, History of vitamin D deficiency, Hyperlipidemia, Hypertension, IBS (irritable bowel syndrome), Insomnia, Migraine, Obesity, OSA on CPAP, PCOS (polycystic ovarian syndrome), PONV (postoperative nausea and vomiting), Thyroid disease, Type 2 diabetes mellitus without complication (HCC), Under care of service provider, Under care of service provider, and Wears contact lenses.    PAST SURGICAL HISTORY: has a past surgical history that includes Cholecystectomy; Tonsillectomy; Breast surgery (Right); Hysterectomy, vaginal; Colonoscopy (N/A, 05/29/2019); Breast cyst excision (Right); Finger fracture surgery (1990); Foot fracture  surgery (2012); hernia repair; Upper gastrointestinal endoscopy; Carpal tunnel release (Right, 03/14/2022); Colonoscopy (N/A, 10/02/2022); Breast biopsy (1992); and Hysterectomy.     CURRENT MEDICATIONS:  has a current medication list which includes the following prescription(s): fluocinolone, empagliflozin , clobetasol propionate, metronidazole, ozempic  (1 mg/dose), triamcinolone , levothyroxine , lisinopril , spironolactone -hydrochlorothiazide, solriamfetol hcl, zinc, d3-1000, and rosuvastatin .    ALLERGIES:  has No Known Allergies.    FAMILY HISTORY: Mother had colon cancer in her 32s.  (Patient had negative colonoscopy ). Otherwise negative for any hematological or oncological conditions.     SOCIAL HISTORY:  reports that she has never smoked. She has never used smokeless tobacco. She reports that she does not drink alcohol and does not use drugs.    REVIEW OF SYSTEMS:     General: Positive for weakness and fatigue. No unanticipated weight loss or decreased appetite. No fever or chills.   Eyes: No blurred vision, eye pain or double vision.   Ears: No hearing problems or drainage. No tinnitus.   Throat: No sore throat, problems with swallowing or dysphagia.   Respiratory: No cough, sputum or hemoptysis. No shortness of breath. No pleuritic chest pain.     Cardiovascular: No chest pain, orthopnea or PND. No lower extremity edema. No palpitation.   Gastrointestinal: No problems with swallowing. No abdominal pain or bloating. No nausea or vomiting. No diarrhea or constipation. No GI bleeding.   Genitourinary: No dysuria, hematuria, frequency or urgency.     Musculoskeletal: No muscle aches or pains. No limitation of movement. No back pain. No gait disturbance, No joint complaints.  Dermatologic: No skin rashes or pruritus. No skin lesions or discolorations.   Psychiatric: No depression, anxiety, or stress or signs of schizophrenia. No change in mood or affect.  Hematologic: No history of bleeding tendency. No bruises  or ecchymosis. No history of clotting problems.  Infectious disease: No fever, chills or frequent infections.   Endocrine: No polydipsia or polyuria. No temperature intolerance.  Neurologic: No headaches or dizziness. No weakness or numbness of the extremities. No changes in balance, coordination,  memory, mentation, behavior.   Allergic/Immunologic: No nasal congestion or hives. No repeated infections.       PHYSICAL EXAM:  The patient is not in acute distress.  Vital signs: Blood pressure (!) 144/92, pulse 93, temperature 97.8 F (36.6 C), temperature source Temporal, resp. rate 18, height 1.6 m (5' 3), weight 97.2 kg (214 lb 3.2 oz), SpO2 99%.     General appearance - well appearing, not in pain or distress  Mental status - good mood, alert and oriented  Eyes - pupils equal and reactive, extraocular eye movements intact  Ears - bilateral TM's and external ear canals normal  Nose - normal and patent, no erythema, discharge or polyps  Mouth - mucous membranes moist, pharynx normal without lesions  Neck - supple, no significant adenopathy  Lymphatics - no palpable lymphadenopathy, no hepatosplenomegaly  Chest - clear to auscultation, no wheezes, rales or rhonchi, symmetric air entry  3 to 4 cm firm mass in the tail of the right breast.  Nontender.  Heart - normal rate, regular rhythm, normal S1, S2, no murmurs, rubs, clicks or gallops  Abdomen - soft, nontender, nondistended, no masses or organomegaly  Neurological - alert, oriented, normal speech, no focal findings or movement disorder noted  Musculoskeletal - no joint tenderness, deformity or swelling  Extremities - peripheral pulses normal, no pedal edema, no clubbing or cyanosis  Skin - normal coloration and turgor, no rashes, no suspicious skin lesions noted     Review of Diagnostic data:   Lab Results   Component Value Date    WBC 13.4 (H) 11/08/2022    HGB 14.5 11/08/2022    HCT 43.7 11/08/2022    MCV 92.9 11/08/2022    PLT 368 11/08/2022       Chemistry         Component Value Date/Time    NA 134 (L) 11/08/2022 1657    K 3.6 (L) 11/08/2022 1657    CL 95 (L) 11/08/2022 1657    CO2 27 11/08/2022 1657    BUN 12 11/08/2022 1657    CREATININE 0.7 11/08/2022 1657        Component Value Date/Time    CALCIUM  10.1 11/08/2022 1657    ALKPHOS 75 11/08/2022 1657    AST 17 11/08/2022 1657    ALT 24 11/08/2022 1657    BILITOT 0.3 11/08/2022 1657          @LASTIMGR @      IMPRESSION:   Fluctuating leukocytosis for 6 years.  Likely reactive  Right axillary mass/enlarged lymph node.  Irritable bowel syndrome sleep apnea  Persistent fatigue likely secondary to sleep apnea    PLAN: Records, labs and images were reviewed and discussed with the patient. I explained to the patient the nature of this problem with leukocytosis and clinical findings with right axillary lymph nodes and management plans.  The patient had fluctuating mild leukocytosis for the last 6 years.  She had normal differential.  Overall consistent with reactive leukocytosis considering the pattern and the fluctuation.  No further workup necessary for this leukocytosis.  I am concerned about findings in the right axilla and breast tail.  I reviewed the results of  the recent mammogram and ultrasound.  However clinical examination showed firm 3 to 4 cm mass in the tail of the right breast.  I think this needs to be evaluated with ultrasound-guided biopsy to rule out underlying malignancy.  I will refer patient back to the interventional radiology for ultrasound-guided biopsy.  We will contact the patient with the results and further recommendations.  Patient's questions were answered to the best of her satisfaction and she verbalized full understanding and agreement.    I spent a total of 60 minutes on the date of the service which included preparing to see the patient, face-to-face patient care, completing clinical documentation, obtaining and/or reviewing separately obtained history, performing a medically appropriate  examination, counseling and educating the patient/family/caregiver, ordering medications, tests, or procedures, communicating with other HCPs (not separately reported), independently interpreting results (not separately reported), communicating results to the patient/family/caregiver and care coordination (not separately reported).                            Yuritza Paulhus,MD                          [facility]                            This note is created with the assistance of a speech recognition program.  While intending to generate a document that actually reflects the content of the visit, the document can still have some errors including those of syntax and sound a like substitutions which may escape proof reading.  It such instances, actual meaning can be extrapolated by contextual diversion.

## 2022-12-29 NOTE — Patient Instructions (Addendum)
Biopsy of axillary LN  Call with results

## 2023-01-02 ENCOUNTER — Encounter

## 2023-01-03 MED ORDER — ROSUVASTATIN CALCIUM 10 MG PO TABS
10 MG | ORAL_TABLET | ORAL | 1 refills | Status: DC
Start: 2023-01-03 — End: 2023-08-20

## 2023-01-03 NOTE — Telephone Encounter (Signed)
 Catherine Forbes is requesting a refill on the following medication(s):  Requested Prescriptions     Pending Prescriptions Disp Refills    rosuvastatin  (CRESTOR ) 10 MG tablet 45 tablet 1     Sig: Take 1 tablet by mouth every other day       Last Visit Date (If Applicable):  11/27/2022    Next Visit Date:    01/15/2023

## 2023-01-09 ENCOUNTER — Encounter

## 2023-01-10 ENCOUNTER — Encounter

## 2023-01-10 NOTE — Telephone Encounter (Signed)
 IR received a request for an axillary lymph node biopsy.  Dr Preston Fleeting reviewed and said [facility]  LM on the nurse line at the [facility].

## 2023-01-15 ENCOUNTER — Ambulatory Visit
Admit: 2023-01-15 | Discharge: 2023-01-15 | Payer: PRIVATE HEALTH INSURANCE | Attending: Family Medicine | Primary: Family Medicine

## 2023-01-15 VITALS — BP 116/74 | HR 71 | Ht 63.0 in | Wt 211.0 lb

## 2023-01-15 DIAGNOSIS — E119 Type 2 diabetes mellitus without complications: Secondary | ICD-10-CM

## 2023-01-15 LAB — POCT GLYCOSYLATED HEMOGLOBIN (HGB A1C): Hemoglobin A1C: 6.6 %

## 2023-01-15 NOTE — Progress Notes (Signed)
 [facility]  [address]   [place], [place] [address]  Phone: [phone]       Name: Catherine Forbes  DOB: January 06, 1971     Chief Complaint:    Catherine Forbes is a 52 y.o. year old female who presents today for   Chief Complaint   Patient presents with    Diabetes     A1c today. Stopped taking the Jardiance  due to frequent yeast infections and since stopping she has not had any issues     3 Month Follow-Up       History of Present Illness:    Diabetes Mellitus Type II, Follow-up: Patient is here for follow-up of Type 2 diabetes mellitus. Stopped jardiance  1 month ago due to yeast infections. Those have resolved now. A1c is steady. At 6.6%.      Hemoglobin A1C   Date Value   01/15/2023 6.6 %   10/13/2022 6.6 % (H)   10/13/2022      Comment:     low total hemoglobin, will need lab drawn     Breast lump - she did see hematology for this, and leukocytosis. Has procedure with IR scheduled for 10/4.     HTN - well controlled. No AE of medications.     Medications:    Outpatient Medications Prior to Visit   Medication Sig Dispense Refill    rosuvastatin  (CRESTOR ) 10 MG tablet Take 1 tablet by mouth every other day 45 tablet 1    fluocinolone (DERMOTIC) 0.01 % OIL oil APPLY TWO DROPS TWICE A DAY TO EAR BOWLS AS NEEDED FOR FLARES      Clobetasol Propionate 0.05 % SHAM WORK INTO A DAMPENED SCALP, LEAVE ON 5 MIN, WASH OFF. USE 2-3 TIMES WEEKLY AS NEEDED FOR FLARES      metroNIDAZOLE (METROGEL) 1 % gel APPLY ONCE A DAY TO FACE      Semaglutide , 1 MG/DOSE, (OZEMPIC , 1 MG/DOSE,) 4 MG/3ML SOPN sc injection INJECT 1MG  INTO THE SKIN ONCE A WEEK 9 Adjustable Dose Pre-filled Pen Syringe 1    triamcinolone  (KENALOG ) 0.1 % ointment Apply 1 Application topically 2 times daily      levothyroxine  (SYNTHROID ) 75 MCG tablet Take 1 tablet by mouth daily 90 tablet 1    lisinopril  (PRINIVIL ;ZESTRIL ) 10 MG tablet Take 1 tablet by mouth daily 90 tablet 1    spironolactone -hydroCHLOROthiazide (ALDACTAZIDE) 25-25 MG per tablet  Take 1 tablet by mouth daily 90 tablet 1    Solriamfetol HCl 150 MG TABS 150 mg      Zinc 100 MG TABS Take 1 tablet by mouth daily      vitamin D (D3-1000) 25 MCG (1000 UT) CAPS Take 1 capsule by mouth daily      SODIUM FLUORIDE 5000 SENSITIVE 1.1-5 % GEL BRUSH TWICE A DAY (Patient not taking: Reported on 01/15/2023)      empagliflozin  (JARDIANCE ) 25 MG tablet Take 1 tablet by mouth daily (Patient not taking: Reported on 01/15/2023) 90 tablet 1     No facility-administered medications prior to visit.       Review of Systems:     Review of Systems     Physical Exam:     Vitals:  BP 116/74 (Site: Left Upper Arm, Position: Sitting, Cuff Size: Small Adult)   Pulse 71   Ht 1.6 m (5' 3)   Wt 95.7 kg (211 lb)   SpO2 96%   BMI 37.38 kg/m  Body mass index is 37.38 kg/m.  Physical Exam  Vitals and nursing note reviewed.   Constitutional:       Appearance: Normal appearance.   Cardiovascular:      Rate and Rhythm: Normal rate.   Pulmonary:      Effort: Pulmonary effort is normal.   Neurological:      General: No focal deficit present.      Mental Status: She is alert.   Psychiatric:         Mood and Affect: Mood normal.         Behavior: Behavior normal.         Thought Content: Thought content normal.         Judgment: Judgment normal.         Assessment:      Diagnosis Orders   1. Type 2 diabetes mellitus without complication, without long-term current use of insulin (HCC)  POCT glycosylated hemoglobin (Hb A1C)      2. Essential hypertension                  Plan:     1. Type 2 diabetes mellitus without complication, without long-term current use of insulin (HCC) - controlled, continue medication at current dose. AE from jardiance  resolved after stopping. Will monitor A1c. If above goal at next visit, will need to consider med adjustment.   Overview:  Ozempic    Jardiance  stopped due to yeast infections  Orders:  -     POCT glycosylated hemoglobin (Hb A1C)  2. Essential hypertension - controlled, continue medication  at current dose  Overview:  Lisinopril , spironolactone  and hctz.     She has appropriate testing scheduled for breast lump. Will follow.     MDM: Moderate MDM - 2 or more chronic conditions with prescription medication management as described above.        Return in about 3 months (around 04/16/2023) for DM, with A1c.  No orders of the defined types were placed in this encounter.    Orders Placed This Encounter   Procedures    POCT glycosylated hemoglobin (Hb A1C)           I reviewed the above assessment and plan with Catherine Forbes.  Questions were answered and it appears that the Catherine Forbes has a good understanding of the visit today.    I would like to see Catherine Forbes back in Return in about 3 months (around 04/16/2023) for DM, with A1c. , or sooner if any new issues occur.    Electronically signed by Silvano GORMAN Salt, DO on 01/15/2023 at 9:26 AM

## 2023-01-26 ENCOUNTER — Encounter

## 2023-01-26 ENCOUNTER — Inpatient Hospital Stay: Admit: 2023-01-26 | Payer: PRIVATE HEALTH INSURANCE | Primary: Family Medicine

## 2023-01-26 DIAGNOSIS — N63 Unspecified lump in unspecified breast: Secondary | ICD-10-CM

## 2023-01-26 DIAGNOSIS — R2231 Localized swelling, mass and lump, right upper limb: Secondary | ICD-10-CM

## 2023-01-30 LAB — SURGICAL PATHOLOGY REPORT

## 2023-02-12 ENCOUNTER — Ambulatory Visit
Admit: 2023-02-12 | Discharge: 2023-02-12 | Payer: PRIVATE HEALTH INSURANCE | Attending: Surgical | Primary: Family Medicine

## 2023-02-12 DIAGNOSIS — G5603 Carpal tunnel syndrome, bilateral upper limbs: Secondary | ICD-10-CM

## 2023-02-12 NOTE — Progress Notes (Signed)
[facility] [place] [facility], [facility]  [facility] [place], [facility]  [address], [address]  [place] [address]  Dept: [phone]  Dept Fax: [phone]     Patient:  Catherine Forbes  Date of Birth: 1970-10-28  Date: 02/12/23      Chief Complaint   Patient presents with    Follow-up     Discuss left carpal tunnel surgery, no new testing              HPI:       I had the pleasure of seeing this patient in the office today for primary complaints of left hand numbness and tingling.  As you know she is a 52 year old female who underwent carpal tunnel release procedure on the right 1 year ago.  Postoperatively she has done quite well from that procedure.  EMG nerve conduction study from over 1 year ago did demonstrate moderate carpal tunnel syndrome on the left.  Over the past 1 year the patient has been having numbness and tingling in the left hand and into the distal forearm.  She states this discomfort can wake her up at night at times.  At this point in time, prior to discussing any further surgical intervention in the form of carpal tunnel release, we will proceed with follow-up EMG nerve conduction study as her previous EMG was over 10-year-old.  We will plan on seeing her back in office after the study for further potential treatment planning.    BP 134/72 (Site: Right Upper Arm, Position: Sitting, Cuff Size: Large Adult)   Pulse 82   Resp 16   Ht 1.6 m (5\' 3" )   Wt 95.7 kg (211 lb)   BMI 37.38 kg/m         Assessment and Plan:     1. Carpal tunnel syndrome, bilateral              Electronically signed by Norlene Duel, PA on 02/12/2023 at 3:35 PM    Please note that this chart was generated using voice recognition Dragon dictation software.  Although every effort was made to ensure the accuracy of this automated transcription, some errors in transcription may have occurred.

## 2023-02-13 ENCOUNTER — Telehealth

## 2023-02-13 NOTE — Telephone Encounter (Signed)
Orders entered, faxed to Dr Bufford Buttner F [phone] (scanned into epic media)

## 2023-02-13 NOTE — Telephone Encounter (Signed)
Patient seen by Julaine Hua 02/12/23 and was ordered a new LUE EMG. Patient wants to go to [facility]. Please add the referral.

## 2023-02-23 ENCOUNTER — Encounter
Admit: 2023-02-23 | Discharge: 2023-02-23 | Payer: PRIVATE HEALTH INSURANCE | Attending: Neurological Surgery | Primary: Family Medicine

## 2023-02-23 DIAGNOSIS — G5602 Carpal tunnel syndrome, left upper limb: Secondary | ICD-10-CM

## 2023-02-23 NOTE — Progress Notes (Signed)
[place] [facility], [facility]  [place] [facility] [place], [facility]  [address], [address]  [place] [address]  Dept: [phone]  Dept Fax: [phone]     Patient:  Catherine Forbes  Date of Birth: 04-15-1971  Date: 02/23/23      Chief Complaint   Patient presents with    Follow-up     EMG follow up           HPI:     I had the pleasure of seeing this patient in the office today in follow-up.  As you know she is a 52 year old female who underwent right carpal tunnel release procedure 1 year ago and did quite well from that surgery.  Over the past several months, the patient has been noting numbness over the palm of her left hand that can course into the pinky and ring finger on the left.  On occasion this discomfort does wake her up at night.  She does feel her symptoms are positional and the more she uses her hands/wrists her symptoms are more severe.  Physical examination today is unremarkable.  EMG nerve conduction study does demonstrate moderate carpal tunnel syndrome on the left.  At this point in time given findings of moderate carpal tunnel syndrome we will proceed conservatively with wrist splints.  She states she has worn them in the distant past but not recently.  We will have her wear the wrist splints for the next 6 to 8 weeks and plan on seeing her back in our office after in follow-up.  Should she still be experiencing ongoing discomfort at that point in time we would give consideration to left carpal tunnel release procedure.  Patient was in agreement the above plan.  Dr. Leanora Ivanoff did come into the room and review my full history and examination. He was involved in both the assessment and treatment plan for this patient today in the office. I did see the patient in collaboration with him today in the office.    BP 132/84   Pulse 88   Ht 1.6 m (5\' 3" )   Wt 95.3 kg (210 lb)   BMI 37.20 kg/m         Assessment and Plan:   No diagnosis  found.          Electronically signed by Norlene Duel, PA on 02/23/2023 at 11:09 AM    Please note that this chart was generated using voice recognition Dragon dictation software.  Although every effort was made to ensure the accuracy of this automated transcription, some errors in transcription may have occurred.

## 2023-03-28 ENCOUNTER — Encounter: Admit: 2023-03-28 | Admitting: Family Medicine

## 2023-03-28 DIAGNOSIS — E039 Hypothyroidism, unspecified: Secondary | ICD-10-CM

## 2023-03-28 MED ORDER — LEVOTHYROXINE SODIUM 75 MCG PO TABS
75 | ORAL_TABLET | Freq: Every day | ORAL | 1 refills | Status: AC
Start: 2023-03-28 — End: ?

## 2023-03-28 NOTE — Telephone Encounter (Signed)
 Catherine Forbes is requesting a refill on the following medication(s):  Requested Prescriptions     Pending Prescriptions Disp Refills    levothyroxine (SYNTHROID) 75 MCG tablet [Pharmacy Med Name: LEVOTHYROXINE 75 MCG TABLET] 90 tablet 1     Sig:

## 2023-04-10 ENCOUNTER — Ambulatory Visit: Admit: 2023-04-10 | Discharge: 2023-04-10 | Payer: PRIVATE HEALTH INSURANCE | Primary: Family Medicine

## 2023-04-10 ENCOUNTER — Ambulatory Visit
Admit: 2023-04-10 | Discharge: 2023-04-10 | Payer: PRIVATE HEALTH INSURANCE | Attending: Family | Admitting: Family | Primary: Family Medicine

## 2023-04-10 VITALS — BP 124/80 | HR 111 | Ht 63.0 in | Wt 214.0 lb

## 2023-04-10 DIAGNOSIS — M25522 Pain in left elbow: Secondary | ICD-10-CM

## 2023-04-10 NOTE — Progress Notes (Signed)
 [place] [facility], [facility] [place] [facility]  [name]  [place] [address]  Dept: [phone]    Catherine Forbes is a 52 y.o. female Established patient, who presents to the walk-in cli

## 2023-04-30 ENCOUNTER — Encounter: Admit: 2023-04-30 | Payer: PRIVATE HEALTH INSURANCE | Admitting: Family Medicine | Primary: Family Medicine

## 2023-04-30 ENCOUNTER — Ambulatory Visit: Admit: 2023-04-30 | Discharge: 2023-04-30 | Payer: PRIVATE HEALTH INSURANCE | Primary: Family Medicine

## 2023-04-30 VITALS — BP 112/82 | HR 97 | Ht 63.0 in | Wt 220.0 lb

## 2023-04-30 DIAGNOSIS — M545 Low back pain, unspecified: Principal | ICD-10-CM

## 2023-04-30 DIAGNOSIS — E119 Type 2 diabetes mellitus without complications: Principal | ICD-10-CM

## 2023-04-30 LAB — POCT GLYCOSYLATED HEMOGLOBIN (HGB A1C): Hemoglobin A1C: 7.5 %

## 2023-04-30 MED ORDER — OZEMPIC (1 MG/DOSE) 4 MG/3ML SC SOPN
4 | SUBCUTANEOUS | 1 refills | 28.00000 days | Status: DC
Start: 2023-04-30 — End: 2024-04-30

## 2023-04-30 NOTE — Progress Notes (Signed)
 [facility]  [address]   [place], [place] [address]  Phone: [phone]       Name: Catherine Forbes  DOB: 1970/08/01     Chief Complaint:    Catherine Forbes is a 53 y.o. year old female who presents today for   Chief Complaint   Patient presents with    Diabetes     A1c today    3 Month Follow-Up    Other     DAX Ok    Lower Back Pain     X 1-2 weeks- all across low back and some into the groin     Joint Swelling     L elbow pain still ongoing since was seen at the walkin on 04/10/23       History of Present Illness:    Notes from past visit(s) and/or chart review:     Diabetes Mellitus Type II, Follow-up: Patient is here for follow-up of Type 2 diabetes mellitus.  She is on ozempic . Her jardiance  was stopped previously due to side effects.     Hemoglobin A1C (%)   Date Value   04/30/2023 7.5   01/15/2023 6.6   10/13/2022 6.6 (H)         Subjective   History of Present Illness  The patient presents for evaluation of diabetes, back pain, and elbow pain.    She admits that she has only administered Ozempic  once in the past month. She reports no difficulties in obtaining the medication. She acknowledges indulging in candy during the holiday season but has since abstained from sweets more. She has not had an issue obtaining her ozempic , she just has not been taking it regularly.    She has been experiencing lower back pain for the past 2 weeks, which is more severe on the right side. She reports no recent activities that could have triggered the pain. She also reports no numbness, tingling, or weakness in her legs. She does not experience any burning sensation during urination. She has a known diagnosis of scoliosis and has previously undergone radiofrequency ablation (RFA) for her back pain, which provided relief for approximately 10 years.    She sought medical attention at a walk-in clinic a few weeks ago for left elbow pain. She was advised that she could continue to use her arm and carry  objects, but should avoid lifting anything. The pain is localized on the outer aspect of her elbow. She suspects that she may have hit her elbow against a door or wall while rushing through her house, but can not recall the incident clearly. She notes mild swelling in the area. She was provided with a splint/compression, but it has not been effective in alleviating the pain.      Medications:    Outpatient Medications Prior to Visit   Medication Sig Dispense Refill    levothyroxine  (SYNTHROID ) 75 MCG tablet TAKE 1 TABLET BY MOUTH EVERY DAY 90 tablet 1    SODIUM FLUORIDE 5000 SENSITIVE 1.1-5 % GEL       rosuvastatin  (CRESTOR ) 10 MG tablet Take 1 tablet by mouth every other day 45 tablet 1    fluocinolone (DERMOTIC) 0.01 % OIL oil APPLY TWO DROPS TWICE A DAY TO EAR BOWLS AS NEEDED FOR FLARES      Clobetasol Propionate 0.05 % SHAM WORK INTO A DAMPENED SCALP, LEAVE ON 5 MIN, WASH OFF. USE 2-3 TIMES WEEKLY AS NEEDED FOR FLARES      metroNIDAZOLE (METROGEL) 1 %  gel APPLY ONCE A DAY TO FACE      triamcinolone  (KENALOG ) 0.1 % ointment Apply 1 Application topically 2 times daily      lisinopril  (PRINIVIL ;ZESTRIL ) 10 MG tablet Take 1 tablet by mouth daily 90 tablet 1    spironolactone -hydroCHLOROthiazide (ALDACTAZIDE) 25-25 MG per tablet Take 1 tablet by mouth daily 90 tablet 1    Solriamfetol HCl 150 MG TABS 150 mg      Zinc 100 MG TABS Take 1 tablet by mouth daily      vitamin D (D3-1000) 25 MCG (1000 UT) CAPS Take 1 capsule by mouth daily      Semaglutide , 1 MG/DOSE, (OZEMPIC , 1 MG/DOSE,) 4 MG/3ML SOPN sc injection INJECT 1MG  INTO THE SKIN ONCE A WEEK 9 Adjustable Dose Pre-filled Pen Syringe 1     No facility-administered medications prior to visit.       Review of Systems:     Review of Systems     Physical Exam:     Vitals:  BP 112/82 (Site: Left Upper Arm, Position: Sitting, Cuff Size: Small Adult)   Pulse 97   Ht 1.6 m (5\' 3" )   Wt 99.8 kg (220 lb)   SpO2 99%   BMI 38.97 kg/m  Body mass index is 38.97 kg/m.     Physical Exam  Vitals and nursing note reviewed.   Constitutional:       Appearance: Normal appearance.   Cardiovascular:      Rate and Rhythm: Normal rate and regular rhythm.      Heart sounds: Normal heart sounds.   Pulmonary:      Effort: Pulmonary effort is normal.      Breath sounds: Normal breath sounds.   Musculoskeletal:      Comments: Left elbow - TTP over lateral epicondyle and with rotation.     Back - TTP low back midline and paraspinals. Pain with rotation and side bending but flexion is ok. Negative straight leg raise b/l. Normal hip exam b/l    Neurological:      General: No focal deficit present.      Mental Status: She is alert.   Psychiatric:         Mood and Affect: Mood normal.         Behavior: Behavior normal.         Assessment:      Diagnosis Orders   1. Type 2 diabetes mellitus without complication, without long-term current use of insulin (HCC)  POCT glycosylated hemoglobin (Hb A1C)    Semaglutide , 1 MG/DOSE, (OZEMPIC , 1 MG/DOSE,) 4 MG/3ML SOPN sc injection      2. Acute bilateral low back pain without sciatica  XR LUMBAR SPINE (2-3 VIEWS)      3. Left lateral epicondylitis                  Plan:        Assessment & Plan  1. Diabetes mellitus.  Her A1c levels have escalated from 6.6 to 7.5, indicating a need for more stringent glycemic control. She is advised to set an alarm on her phone as a reminder to take her medication consistently. She has been counseled on the importance of consistent Ozempic  administration. A prescription refill for Ozempic  has been provided. A follow-up appointment has been scheduled in 3 months for an A1c recheck.   - If A1c still elevated at next visit - will need to increase dose of ozempic  or consider adding a second glucose lowering agent.  2. Lower back pain.  Her hip examination yielded normal results, suggesting that the pain is likely originating from her back rather than her hips. An x-ray of her lower back will be ordered to further investigate the  cause of her pain.    3. Elbow pain.  Her symptoms are consistent with lateral epicondylitis, commonly known as tennis elbow. She has been advised to purchase a strap specifically designed for tennis elbow and was counseled on how to use this.    Follow-up  The patient will follow up in 3 months.    PROCEDURE  The patient has previously undergone radiofrequency ablation (RFA) for her back pain, which provided relief for approximately 10 years.           MDM: moderate - uncontrolled chronic condition with prescription medication management       Return in about 3 months (around 07/29/2023) for DM, with A1c.    Orders Placed This Encounter   Medications    Semaglutide , 1 MG/DOSE, (OZEMPIC , 1 MG/DOSE,) 4 MG/3ML SOPN sc injection     Sig: Inject 1 mg into the skin every 7 days     Dispense:  9 Adjustable Dose Pre-filled Pen Syringe     Refill:  1     Orders Placed This Encounter   Procedures    XR LUMBAR SPINE (2-3 VIEWS)     Standing Status:   Future     Number of Occurrences:   1     Standing Expiration Date:   04/29/2024     Order Specific Question:   Reason for exam:     Answer:   low back pain    POCT glycosylated hemoglobin (Hb A1C)         I reviewed the above assessment and plan with Nakeyia.  Questions were answered and it appears that the Roselynne has a good understanding of the visit today.    The patient (or guardian, if applicable) and other individuals in attendance with the patient were advised that Artificial Intelligence will be utilized during this visit to record, process the conversation to generate a clinical note and to support improvement of the AI technology. The patient (or guardian, if applicable) and other individuals in attendance at the appointment consented to the use of AI, including the recording.      An electronic signature was used to authenticate this note.    --Julianne Octave, DO     Electronically signed by Julianne Octave, DO on 04/30/2023 at 10:14 AM

## 2023-05-03 ENCOUNTER — Emergency Department: Admit: 2023-05-04 | Payer: PRIVATE HEALTH INSURANCE | Primary: Family Medicine

## 2023-05-03 DIAGNOSIS — N3001 Acute cystitis with hematuria: Secondary | ICD-10-CM

## 2023-05-03 NOTE — ED Notes (Signed)
Pt siting at bedside, IV fluids infusing; states pain had improved then getting uncomfortable again - changed position to sitting.

## 2023-05-03 NOTE — ED Provider Notes (Signed)
[facility] Emergency Department  [phone] [facility] [address]  [facility] [place] [address]  Phone: [phone]  Fax: [phone]      Attending Physician Attestation          CHIEF COMPLAINT       Chief Complaint   Patient presents with    Back Pain     Onset 1.5 weeks ago - no known injury  Pcp ordered a xray on Monday it was negative  Pain has worsened  LOW back mainly on R side that radiates to the RLQ     Abdominal Pain    Nausea     But denies any vomiting       DIAGNOSTIC RESULTS     LABS:  Labs Reviewed   COMPREHENSIVE METABOLIC PANEL - Abnormal; Notable for the following components:       Result Value    Chloride 95 (*)     Glucose 121 (*)     All other components within normal limits   CBC WITH AUTO DIFFERENTIAL - Abnormal; Notable for the following components:    WBC 13.6 (*)     Neutrophils % 71 (*)     Lymphocytes % 20 (*)     Neutrophils Absolute 9.70 (*)     All other components within normal limits   LIPASE   URINALYSIS       All other labs were within normal range or not returned as of this dictation.    RADIOLOGY:  CT ABDOMEN PELVIS WO CONTRAST Additional Contrast? None   Final Result   1. No acute intra-abdominal or pelvic process.   2. Colonic diverticulosis without evidence of diverticulitis.               EMERGENCY DEPARTMENT COURSE:   Vitals:    Vitals:    05/03/23 2023   BP: (!) 151/74   Pulse: (!) 113   Resp: 16   Temp: 98.3 F (36.8 C)   TempSrc: Oral   SpO2: 98%   Weight: 95.3 kg (210 lb)   Height: 1.778 m (5\' 10" )     -------------------------  BP: (!) 151/74, Temp: 98.3 F (36.8 C), Pulse: (!) 113, Respirations: 16             PERTINENT ATTENDING PHYSICIAN COMMENTS:    I performed a history and physical examination of the patient and discussed management with the mid level provider. I reviewed the mid level provider's note and agree with the documented findings and plan of care. Any areas of disagreement are noted on the chart. I was personally present for the key portions of any  procedures. I have documented in the chart those procedures where I was not present during the key portions. I have reviewed the emergency nurses triage note. I agree with the chief complaint, past medical history, past surgical history, allergies, medications, social and family history as documented unless otherwise noted below. Documentation of the HPI, Physical Exam and Medical Decision Making performed by mid level providers is based on my personal performance of the HPI, PE and MDM. For Residents, Physician Assistant/ Nurse Practitioner cases/documentation I have personally evaluated this patient and have completed at least one if not all key elements of the E/M (history, physical exam, and MDM). Additional findings are as noted.    Right lumbar strain. Lumbar muscles tender to palpation.  CT stone was done and negative.      (Please note that portions of this note were completed with a voice recognition  program.  Efforts were made to edit the dictations but occasionally words are mis-transcribed.)    Rennie Plowman, MD  Attending Emergency Medicine Physician        Ashraf-Moghal, Roosevelt [place]ocks, MD  05/03/23 [phone]

## 2023-05-03 NOTE — Discharge Instructions (Signed)
Complete antibiotic as prescribed.    Flexeril as prescribed to help with muscle tension or spasm.    Tylenol and ibuprofen as directed over-the-counter to help with pain.    Return to the ER: Fevers, continued worsening flank pain, abdominal pain, nausea or vomiting, weakness, no urine output, change in bowel or bladder function; or any other concerning symptoms.

## 2023-05-03 NOTE — ED Provider Notes (Signed)
[facility] EMERGENCY DEPARTMENT  EMERGENCY DEPARTMENT ENCOUNTER      Pt Name: Catherine Forbes  MRN: [id number]  Birthdate 09/26/1970  Date of evaluation: 05/03/2023  Provider: Rolly Pancake, APRN - CNP  11:40 PM    CHIEF COMPLAINT       Chief Complaint   Patient presents with    Back Pain     Onset 1.5 weeks ago - no known injury  Pcp ordered a xray on Monday it was negative  Pain has worsened  LOW back mainly on R side that radiates to the RLQ     Abdominal Pain    Nausea     But denies any vomiting         HISTORY OF PRESENT ILLNESS    Catherine Forbes is a 53 y.o. female who presents to the emergency department      This is a nontoxic-appearing 53 year old female presenting via private auto with her husband for evaluation of right low back pain wrapping around the right abdomen with nausea, the patient reports that she has had an aching pain in the low back for the past week, became acutely worse tonight associated with nausea tonight no vomiting, the patient's not noticed any urinary changes frequency urgency painful urination, she has not had any recent falls or traumas, no history of renal colic, no radicular pain down the right leg, no chest pain or shortness of breath; patient was concerned due to the worsening symptoms prompting her to come to the emergency department for evaluation, the patient took 2 over-the-counter acetaminophen prior to arrival 3 PM without relief of the pain.  No abnormal vaginal discharge or bleeding.  LMP: Hysterectomy.    The history is provided by the patient and medical records.       Nursing Notes were reviewed.    REVIEW OF SYSTEMS       Review of Systems   Constitutional:  Negative for chills, fatigue and fever.   HENT:  Negative for congestion and sore throat.    Respiratory:  Negative for cough and shortness of breath.    Cardiovascular:  Negative for chest pain and leg swelling.   Gastrointestinal:  Positive for nausea. Negative for abdominal pain, diarrhea and  vomiting.   Genitourinary:  Positive for flank pain. Negative for dysuria and hematuria.   Musculoskeletal:  Positive for back pain. Negative for neck pain.   Skin:  Negative for rash and wound.   Neurological:  Negative for numbness and headaches.   Psychiatric/Behavioral:  Negative for confusion and suicidal ideas.        Except as noted above the remainder of the review of systems was reviewed and negative.       PAST MEDICAL HISTORY     Past Medical History:   Diagnosis Date    Arthritis     Breast cyst August 18    Bronchitis     Carpal tunnel syndrome 03/14/2022    Carpal tunnel syndrome on both sides     Colon polyp     Diabetes mellitus (HCC)     Fatigue     GERD (gastroesophageal reflux disease)     Glucose intolerance     History of blood transfusion 1998    History of diverticulitis     History of vitamin D deficiency     Hyperlipidemia     Hypertension     IBS (irritable bowel syndrome)     Insomnia     Migraine  Obesity     OSA on CPAP     PCOS (polycystic ovarian syndrome)     PONV (postoperative nausea and vomiting)     Thyroid disease     Type 2 diabetes mellitus without complication (HCC)     Under care of service provider 03/01/2022    pcp-[facility]-last visit aug 2023    Under care of service provider 03/01/2022    sleep disorder-dr jacob-[place]-last visit nov 2023    Wears contact lenses          SURGICAL HISTORY       Past Surgical History:   Procedure Laterality Date    BREAST BIOPSY  1992    BREAST CYST EXCISION Right     Age 62    BREAST SURGERY Right     benign cyst removed     CARPAL TUNNEL RELEASE Right 03/14/2022    CARPAL TUNNEL RELEASE performed by Lorne Skeens, MD at [facility] OR    CHOLECYSTECTOMY      COLONOSCOPY N/A 05/29/2019    COLONOSCOPY WITH BIOPSY performed by Tyna Jaksch, MD at [facility] OR    COLONOSCOPY N/A 10/02/2022    COLONOSCOPY DIAGNOSTIC performed by Tery Sanfilippo, MD at [facility] ENDO    FINGER FRACTURE SURGERY  1990    FOOT FRACTURE SURGERY  2012     HERNIA REPAIR      umbilical    HYSTERECTOMY (CERVIX STATUS UNKNOWN)      HYSTERECTOMY, VAGINAL      pt has had 2 miscarriages, and a D+C also    TONSILLECTOMY      UPPER GASTROINTESTINAL ENDOSCOPY      US BREAST BIOPSY W LOC DEVICE 1ST LESION RIGHT Right 01/26/2023    US BREAST BIOPSY W LOC DEVICE 1ST LESION RIGHT 01/26/2023 [facility] CENTER         CURRENT MEDICATIONS       Previous Medications    CLOBETASOL PROPIONATE 0.05 % SHAM    WORK INTO A DAMPENED SCALP, LEAVE ON 5 MIN, WASH OFF. USE 2-3 TIMES WEEKLY AS NEEDED FOR FLARES    FLUOCINOLONE (DERMOTIC) 0.01 % OIL OIL    APPLY TWO DROPS TWICE A DAY TO EAR BOWLS AS NEEDED FOR FLARES    LEVOTHYROXINE (SYNTHROID) 75 MCG TABLET    TAKE 1 TABLET BY MOUTH EVERY DAY    LISINOPRIL (PRINIVIL;ZESTRIL) 10 MG TABLET    Take 1 tablet by mouth daily    METRONIDAZOLE (METROGEL) 1 % GEL    APPLY ONCE A DAY TO FACE    ROSUVASTATIN (CRESTOR) 10 MG TABLET    Take 1 tablet by mouth every other day    SEMAGLUTIDE, 1 MG/DOSE, (OZEMPIC, 1 MG/DOSE,) 4 MG/3ML SOPN SC INJECTION    Inject 1 mg into the skin every 7 days    SODIUM FLUORIDE 5000 SENSITIVE 1.1-5 % GEL        SOLRIAMFETOL HCL 150 MG TABS    150 mg    SPIRONOLACTONE-HYDROCHLOROTHIAZIDE (ALDACTAZIDE) 25-25 MG PER TABLET    Take 1 tablet by mouth daily    TRIAMCINOLONE (KENALOG) 0.1 % OINTMENT    Apply 1 Application topically 2 times daily    VITAMIN D (D3-1000) 25 MCG (1000 UT) CAPS    Take 1 capsule by mouth daily    ZINC 100 MG TABS    Take 1 tablet by mouth daily       ALLERGIES     Patient has no known allergies.    FAMILY  HISTORY       Family History   Problem Relation Age of Onset    Colon Cancer Mother         before age 21     Other Mother         lupus     Diabetes Mother     Dementia Mother     Other Father         thyroid disease    COPD Father     Lymphoma Paternal Grandmother 17    Breast Cancer Paternal Cousin 39          SOCIAL HISTORY       Social History     Socioeconomic History    Marital status: Married      Spouse name: None    Number of children: None    Years of education: None    Highest education level: None   Tobacco Use    Smoking status: Never    Smokeless tobacco: Never   Vaping Use    Vaping status: Never Used   Substance and Sexual Activity    Alcohol use: Never    Drug use: Never    Sexual activity: Yes     Partners: Male     Social Determinants of Health     Financial Resource Strain: Low Risk  (10/12/2022)    Overall Financial Resource Strain (CARDIA)     Difficulty of Paying Living Expenses: Not hard at all   Food Insecurity: No Food Insecurity (10/12/2022)    Hunger Vital Sign     Worried About Running Out of Food in the Last Year: Never true     Ran Out of Food in the Last Year: Never true   Transportation Needs: Unknown (10/12/2022)    PRAPARE - Transportation     Lack of Transportation (Non-Medical): No   Housing Stability: Unknown (10/12/2022)    Housing Stability Vital Sign     Unstable Housing in the Last Year: No       SCREENINGS                         Glasgow Coma Scale  Eye Opening: Spontaneous  Best Verbal Response: Oriented  Best Motor Response: Obeys commands  Glasgow Coma Scale Score: 15                     CIWA Assessment  BP: (!) 115/92  Pulse: 91                 PHYSICAL EXAM       ED Triage Vitals   BP Systolic BP Percentile Diastolic BP Percentile Temp Temp src Pulse Resp SpO2   -- -- -- -- -- -- -- --      Height Weight         -- --             Physical Exam  Vitals and nursing note reviewed.   Constitutional:       General: She is not in acute distress.     Appearance: Normal appearance. She is not ill-appearing or toxic-appearing.   HENT:      Head: Normocephalic and atraumatic.      Right Ear: External ear normal.      Left Ear: External ear normal.      Nose: Nose normal.      Mouth/Throat:      Mouth: Mucous membranes are  moist.   Eyes:      General:         Right eye: No discharge.         Left eye: No discharge.      Conjunctiva/sclera: Conjunctivae normal.   Cardiovascular:       Rate and Rhythm: Normal rate and regular rhythm.      Pulses: Normal pulses.   Pulmonary:      Effort: Pulmonary effort is normal.      Breath sounds: Normal breath sounds.   Abdominal:      General: There is no distension.      Palpations: Abdomen is soft. There is no mass.      Tenderness: There is no abdominal tenderness. There is no guarding.      Hernia: No hernia is present.   Musculoskeletal:         General: No deformity or signs of injury.      Cervical back: Normal range of motion and neck supple. No rigidity or tenderness.      Right lower leg: No edema.      Left lower leg: No edema.      Comments: Reproducible pain in the right low flank right SI joint region.  No midline thoracic or lumbar tenderness.  Normal inspection to the back no ecchymosis erythema or rashes.  Bilateral upper lower extremities 5-5 motor strength without gross deformities or swelling.   Skin:     General: Skin is warm and dry.      Capillary Refill: Capillary refill takes less than 2 seconds.   Neurological:      General: No focal deficit present.      Mental Status: She is alert and oriented to person, place, and time. Mental status is at baseline.      Gait: Gait normal.   Psychiatric:         Mood and Affect: Mood normal.         Behavior: Behavior normal.         DIAGNOSTIC RESULTS     EKG: All EKG's are interpreted by the Emergency Department Physician who either signs or Co-signs this chart in the absence of a cardiologist.        RADIOLOGY:   Non-plain film images such as CT, Ultrasound and MRI are read by the radiologist. Plain radiographic images are visualized and preliminarily interpreted by the emergency physician with the below findings:        Interpretation per the Radiologist below, if available at the time of this note:    CT ABDOMEN PELVIS WO CONTRAST Additional Contrast? None   Final Result   1. No acute intra-abdominal or pelvic process.   2. Colonic diverticulosis without evidence of diverticulitis.                ED BEDSIDE ULTRASOUND:   Performed by ED Physician - none    LABS:  Labs Reviewed   COMPREHENSIVE METABOLIC PANEL - Abnormal; Notable for the following components:       Result Value    Chloride 95 (*)     Glucose 121 (*)     All other components within normal limits   CBC WITH AUTO DIFFERENTIAL - Abnormal; Notable for the following components:    WBC 13.6 (*)     Neutrophils % 71 (*)     Lymphocytes % 20 (*)     Neutrophils Absolute 9.70 (*)     All other components within normal  limits   URINALYSIS - Abnormal; Notable for the following components:    Turbidity UA SLIGHTLY CLOUDY (*)     All other components within normal limits   MICROSCOPIC URINALYSIS - Abnormal; Notable for the following components:    Bacteria, UA MODERATE (*)     Mucus, UA 1+ (*)     All other components within normal limits   CULTURE, URINE   LIPASE       All other labs were within normal range or not returned as of this dictation.    EMERGENCY DEPARTMENT COURSE and DIFFERENTIAL DIAGNOSIS/MDM:   Vitals:    Vitals:    05/03/23 2023 05/03/23 2318   BP: (!) 151/74 (!) 115/92   Pulse: (!) 113 91   Resp: 16 16   Temp: 98.3 F (36.8 C)    TempSrc: Oral    SpO2: 98% 97%   Weight: 95.3 kg (210 lb)    Height: 1.778 m (5\' 10" )            Medical Decision Making  This is a nontoxic-appearing 53 year old female presenting via private auto with her husband for evaluation of right low back pain wrapping around the right abdomen with nausea, the patient reports that she has had an aching pain in the low back for the past week, became acutely worse tonight associated with nausea tonight no vomiting, the patient's not noticed any urinary changes frequency urgency painful urination, she has not had any recent falls or traumas, no history of renal colic, no radicular pain down the right leg, no chest pain or shortness of breath; patient was concerned due to the worsening symptoms prompting her to come to the emergency department for evaluation, the  patient took 2 over-the-counter acetaminophen prior to arrival 3 PM without relief of the pain.  No abnormal vaginal discharge or bleeding.  LMP: Hysterectomy.    Screening lab work, urinalysis, CT abdomen pelvis without contrast ordered to rule out acute abnormalities.    Patient was given a liter of normal saline, 30 mg IV push Toradol, 60 mg IM Norflex to help with acute pain symptoms in the department.    Lab work returns CBC shows mild leukocytosis white blood cell count is 13.6, CMP negative for acute abnormalities, normal lipase, urinalysis slightly cloudy with 10-20 white blood cells moderate bacteria 10-20 epithelial cells urine culture is pending.    CT ABDOMEN PELVIS WO CONTRAST Additional Contrast? None    Result Date: 05/03/2023  EXAMINATION: CT OF THE ABDOMEN AND PELVIS WITHOUT CONTRAST 05/03/2023 9:20 pm TECHNIQUE: CT of the abdomen and pelvis was performed without the administration of intravenous contrast. Multiplanar reformatted images are provided for review. Automated exposure control, iterative reconstruction, and/or weight based adjustment of the mA/kV was utilized to reduce the radiation dose to as low as reasonably achievable. COMPARISON: CT abdomen pelvis 11/08/2022 HISTORY: ORDERING SYSTEM PROVIDED HISTORY: right low flank pain wrapping to right abdomen. TECHNOLOGIST PROVIDED HISTORY: right low flank pain wrapping to right abdomen. Decision Support Exception - unselect if not a suspected or confirmed emergency medical condition->Emergency Medical Condition (MA) Is the patient pregnant?->No Reason for Exam: pt c/o right flank pain/lower back pain x1 week, states no hx of stones, hx of scoliosis FINDINGS: Lung Bases: Normal. Inferior Mediastinum: Normal. Liver: Normal Gallbladder: Surgically absent Biliary System: Non-dilated. Pancreas: Normal. Spleen: Normal. Adrenals: Normal. Kidneys: Normal. Ureters: Normal in course and caliber. Bladder: Normal. Pelvis: Normal. Stomach: Normal. Duodenum:  Normal. Small Bowel: Normal. Colon: Occasional colonic diverticula without  adjacent inflammatory changes. Appendix: The appendix is normal. Lymph Nodes: No lymphadenopathy. Peritoneum: No ascites or free air. No fluid collection. Retroperitoneum: Normal. Vessels: Normal caliber vessels. Abdominal Wall: Normal. Bones: No acute osseous findings.  Levoconvex scoliotic curvature of the thoracolumbar spine.  Heavy facet arthrosis within the lumbar spine.     1. No acute intra-abdominal or pelvic process. 2. Colonic diverticulosis without evidence of diverticulitis.     Patient having right flank pain, urinalysis appears to be a contaminated collection, urine culture is pending, plan is to start the patient on Keflex first dose was given in the emergency department, remaining prescription of Keflex was sent to the patient's pharmacy of choice as well as a short fill prescription for Flexeril muscle relaxant; discussed with patient this could be muscular skeletal in nature, she is established with a family care provider I did recommend that she follows with her family care provider for reevaluation discussed outpatient physical therapy as needed, have recheck, urine patient verbalized understanding patient feels comfortable with the discharge plan reasons to return to the emergency department state on the discharge paperwork patient's discharge ambulatory no acute distress with physiologic vital signs.  Differential diagnosis: Acute cystitis, pyelonephritis, renal colic, lumbar strain, appendicitis, colitis; no evidence of a cauda equina on exam today.    Amount and/or Complexity of Data Reviewed  Labs: ordered.  Radiology: ordered.    Risk  Prescription drug management.            REASSESSMENT          CRITICAL CARE TIME   Total Critical Care time was  minutes, excluding separately reportable procedures.  There was a high probability of clinically significant/life threatening deterioration in the patient's condition which  required my urgent intervention.      CONSULTS:  None    PROCEDURES:  Unless otherwise noted below, none     Procedures        FINAL IMPRESSION      1. Acute cystitis with hematuria    2. Right flank pain          DISPOSITION/PLAN   DISPOSITION Decision To Discharge 05/03/2023 11:27:09 PM   DISPOSITION CONDITION Stable           PATIENT REFERRED TO:  Calvert Cantor, DO  [address].  [place] [address]  [phone]    Schedule an appointment as soon as possible for a visit         DISCHARGE MEDICATIONS:  New Prescriptions    CEPHALEXIN (KEFLEX) 500 MG CAPSULE    Take 1 capsule by mouth 3 times daily for 10 days    CYCLOBENZAPRINE (FLEXERIL) 10 MG TABLET    Take 1 tablet by mouth 3 times daily as needed for Muscle spasms     Controlled Substances Monitoring:          No data to display                (Please note that portions of this note were completed with a voice recognition program.  Efforts were made to edit the dictations but occasionally words are mis-transcribed.)    Rolly Pancake, APRN - CNP (electronically signed)             Rolly Pancake, APRN - CNP  05/03/23 2340

## 2023-05-04 ENCOUNTER — Inpatient Hospital Stay
Admit: 2023-05-04 | Discharge: 2023-05-04 | Disposition: A | Discharge status: Home / Self Care | Payer: PRIVATE HEALTH INSURANCE | Attending: Emergency Medicine

## 2023-05-04 LAB — COMPREHENSIVE METABOLIC PANEL
ALT: 26 U/L (ref 5–33)
AST: 17 U/L (ref <32)
Albumin/Globulin Ratio: 1.5 (ref 1.0–2.5)
Albumin: 4.8 g/dL (ref 3.5–5.2)
Alkaline Phosphatase: 69 U/L (ref 35–104)
Anion Gap: 13 mmol/L (ref 9–17)
BUN: 13 mg/dL (ref 6–20)
CO2: 27 mmol/L (ref 20–31)
Calcium: 9.9 mg/dL (ref 8.6–10.4)
Chloride: 95 mmol/L — ABNORMAL LOW (ref 98–107)
Creatinine: 0.6 mg/dL (ref 0.5–0.9)
Est, Glom Filt Rate: >90 mL/min/{1.73_m2} (ref >60)
Glucose: 121 mg/dL — ABNORMAL HIGH (ref 70–99)
Potassium: 3.7 mmol/L (ref 3.7–5.3)
Sodium: 135 mmol/L (ref 135–144)
Total Bilirubin: 0.3 mg/dL (ref 0.3–1.2)
Total Protein: 8 g/dL (ref 6.4–8.3)

## 2023-05-04 LAB — CBC WITH AUTO DIFFERENTIAL
Basophils %: 1 % (ref 0–2)
Basophils Absolute: 0.1 10*3/uL (ref 0.0–0.2)
Eosinophils %: 2 % (ref 1–4)
Eosinophils Absolute: 0.3 10*3/uL (ref 0.0–0.4)
Hematocrit: 43.3 % (ref 36–46)
Hemoglobin: 14.6 g/dL (ref 12.0–16.0)
Lymphocytes %: 20 % — ABNORMAL LOW (ref 24–44)
Lymphocytes Absolute: 2.6 10*3/uL (ref 1.0–4.8)
MCH: 31 pg (ref 26–34)
MCHC: 33.7 g/dL (ref 31–37)
MCV: 92 fL (ref 80–100)
MPV: 7.3 fL (ref 6.0–12.0)
Monocytes %: 6 % (ref 2–11)
Monocytes Absolute: 0.8 10*3/uL (ref 0.1–1.2)
Neutrophils %: 71 % — ABNORMAL HIGH (ref 36–66)
Neutrophils Absolute: 9.7 10*3/uL — ABNORMAL HIGH (ref 1.8–7.7)
Platelets: 366 10*3/uL (ref 140–450)
RBC: 4.7 m/uL (ref 4.0–5.2)
RDW: 13 % (ref 12.5–15.4)
WBC: 13.6 10*3/uL — ABNORMAL HIGH (ref 3.5–11.0)

## 2023-05-04 LAB — URINALYSIS
Bilirubin, Urine: NEGATIVE
Glucose, Ur: NEGATIVE mg/dL
Ketones, Urine: NEGATIVE mg/dL
Leukocyte Esterase, Urine: NEGATIVE
Nitrite, Urine: NEGATIVE
Protein, UA: NEGATIVE mg/dL
Specific Gravity, UA: 1.027 (ref 1.005–1.030)
Urine Hgb: NEGATIVE
Urobilinogen, Urine: NORMAL U/dL (ref 0.0–1.0)
pH, Urine: 6 (ref 5.0–8.0)

## 2023-05-04 LAB — LIPASE: Lipase: 33 U/L (ref 13–60)

## 2023-05-04 LAB — MICROSCOPIC URINALYSIS
Epithelial Cells, UA: 10 /[HPF] (ref 0–5)
RBC, UA: 0 /[HPF] (ref 0–2)
WBC, UA: 10 /[HPF] (ref 0–5)

## 2023-05-04 MED ORDER — CYCLOBENZAPRINE HCL 10 MG PO TABS
10 | ORAL_TABLET | Freq: Three times a day (TID) | ORAL | 0 refills | Status: AC | PRN
Start: 2023-05-04 — End: 2023-05-13

## 2023-05-04 MED ORDER — CEPHALEXIN 500 MG PO CAPS
500 | ORAL_CAPSULE | Freq: Three times a day (TID) | ORAL | 0 refills | Status: AC
Start: 2023-05-04 — End: 2023-05-13

## 2023-05-04 MED ORDER — KETOROLAC TROMETHAMINE 30 MG/ML IJ SOLN
30 | Freq: Once | INTRAMUSCULAR | Status: AC
Start: 2023-05-04 — End: 2023-05-03
  Administered 2023-05-04: 02:00:00 30 mg via INTRAVENOUS

## 2023-05-04 MED ORDER — SODIUM CHLORIDE 0.9 % IV BOLUS
0.9 | Freq: Once | INTRAVENOUS | Status: AC
Start: 2023-05-04 — End: 2023-05-03
  Administered 2023-05-04: 02:00:00 1000 mL via INTRAVENOUS

## 2023-05-04 MED ORDER — ORPHENADRINE CITRATE 30 MG/ML IJ SOLN
30 | Freq: Once | INTRAMUSCULAR | Status: AC
Start: 2023-05-04 — End: 2023-05-03
  Administered 2023-05-04: 02:00:00 60 mg via INTRAMUSCULAR

## 2023-05-04 MED ORDER — CEPHALEXIN 250 MG PO CAPS
250 | Freq: Once | ORAL | Status: AC
Start: 2023-05-04 — End: 2023-05-03
  Administered 2023-05-04: 05:00:00 500 mg via ORAL

## 2023-05-04 MED FILL — CEPHALEXIN 250 MG PO CAPS: 250 MG | ORAL | Qty: 2

## 2023-05-04 MED FILL — KETOROLAC TROMETHAMINE 30 MG/ML IJ SOLN: 30 MG/ML | INTRAMUSCULAR | Qty: 1

## 2023-05-04 MED FILL — ORPHENADRINE CITRATE 30 MG/ML IJ SOLN: 30 MG/ML | INTRAMUSCULAR | Qty: 2

## 2023-05-04 NOTE — Telephone Encounter (Signed)
Pt stated that her back pain got so intense that she went to the ED yesterday. Dx with UTI. Imaging was clear of stones. Pt was prescribed Flexeril and Keflex. She was told to f/u with PCP, but is not sure that she will need to if the medications work.     Advise pt to schedule if sx persist?    Please advise?    Pt stated that a MyChart response would be okay.

## 2023-05-04 NOTE — Telephone Encounter (Signed)
Can just come in if she doesn't feel better. If the meds work and symptoms imrpove - no need to come in for appt

## 2023-05-05 LAB — CULTURE, URINE: Culture: NO GROWTH

## 2023-05-16 ENCOUNTER — Ambulatory Visit
Admit: 2023-05-16 | Discharge: 2023-05-16 | Payer: PRIVATE HEALTH INSURANCE | Attending: Neurological Surgery | Primary: Family Medicine

## 2023-05-16 VITALS — BP 126/80 | HR 121 | Resp 17 | Ht 70.0 in | Wt 210.0 lb

## 2023-05-16 DIAGNOSIS — M779 Enthesopathy, unspecified: Secondary | ICD-10-CM

## 2023-05-16 NOTE — Progress Notes (Signed)
[facility], [facility]  [facility] [place], [facility]  [address], [address]  [place] [address]  Dept: [phone]  Dept Fax: [phone]     Patient:  Catherine Forbes  Date of Birth: [birth year]  Date: 05/16/23      Chief Complaint   Patient presents with    Follow-up     Carpal Tunnel (Left wrist) 2 month follow up            HPI:     I had the pleasure of seeing this patient back in the office today in follow-up.  As you know she is a 53 year old female who is undergone previous right carpal tunnel release procedure over 1 year ago with good results.  She presented to my office most recently with complaints of left hand numbness.  EMG nerve conduction study was consistent with moderate carpal tunnel syndrome on the left.  She returns today in follow-up.  She has been wearing her wrist splints and indicates that her left hand symptoms have significantly improved.  Most recently over the past 1 months she has been experiencing focal discomfort over the left lateral epicondyle.  Examination today demonstrates focal tenderness over the lateral epicondyle consistent with tendinitis at her elbow.  For the time being we will hold on any further intervention regarding her carpal tunnel syndrome as she is feeling better.  Regarding the tendinitis, I am referring her to orthopedics for evaluation.  The patient was agreeable and I will provide her with appropriate referral.        Physical Exam:      BP 126/80 (Site: Left Upper Arm, Position: Sitting, Cuff Size: Large Adult)   Pulse (!) 121   Resp 17   Ht 1.778 m (5\' 10" )   Wt 95.3 kg (210 lb)   BMI 30.13 kg/m         Assessment and Plan:     1. Tendinitis              Electronically signed by Lorne Skeens, MD on 05/16/2023 at 2:05 PM    Please note that this chart was generated using voice recognition Dragon dictation software.  Although every effort was made to ensure the accuracy of this  automated transcription, some errors in transcription may have occurred.

## 2023-05-29 ENCOUNTER — Telehealth

## 2023-05-29 NOTE — Telephone Encounter (Signed)
Called patient and left message to get x-ray done prior to appointment

## 2023-05-31 ENCOUNTER — Ambulatory Visit
Admit: 2023-05-31 | Discharge: 2023-05-31 | Payer: PRIVATE HEALTH INSURANCE | Attending: Orthopaedic Surgery | Primary: Family Medicine

## 2023-05-31 ENCOUNTER — Inpatient Hospital Stay: Payer: PRIVATE HEALTH INSURANCE | Primary: Family Medicine

## 2023-05-31 ENCOUNTER — Inpatient Hospital Stay: Admit: 2023-05-31 | Payer: PRIVATE HEALTH INSURANCE | Primary: Family Medicine

## 2023-05-31 VITALS — Ht 70.0 in | Wt 210.0 lb

## 2023-05-31 DIAGNOSIS — M7712 Lateral epicondylitis, left elbow: Secondary | ICD-10-CM

## 2023-05-31 DIAGNOSIS — M25512 Pain in left shoulder: Secondary | ICD-10-CM
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
## 2023-06-01 MED ORDER — METHYLPREDNISOLONE ACETATE 80 MG/ML IJ SUSP
80 | Freq: Once | INTRAMUSCULAR | Status: AC
Start: 2023-06-01 — End: 2023-05-31
  Administered 2023-05-31: 15:00:00 80 mg via INTRA_ARTICULAR

## 2023-06-01 MED ORDER — METHYLPREDNISOLONE ACETATE 80 MG/ML IJ SUSP
80 | Freq: Once | INTRAMUSCULAR | Status: AC
Start: 2023-06-01 — End: 2023-05-31
  Administered 2023-05-31: 15:00:00 40 mg via INTRA_ARTICULAR

## 2023-06-01 MED ORDER — LIDOCAINE HCL 1 % IJ SOLN
1 | Freq: Once | INTRAMUSCULAR | Status: AC
Start: 2023-06-01 — End: 2023-05-31
  Administered 2023-05-31: 15:00:00 0.5 mL via INTRA_ARTICULAR

## 2023-06-01 MED ORDER — LIDOCAINE HCL 1 % IJ SOLN
1 | Freq: Once | INTRAMUSCULAR | Status: AC
Start: 2023-06-01 — End: 2023-05-31
  Administered 2023-05-31: 15:00:00 2 mL via INTRA_ARTICULAR

## 2023-06-01 NOTE — Addendum Note (Signed)
Addended by: Oliver Pila on: 06/01/2023 09:52 AM     Modules accepted: Orders

## 2023-06-06 ENCOUNTER — Inpatient Hospital Stay: Admit: 2023-06-06 | Discharge: 2023-06-06 | Payer: PRIVATE HEALTH INSURANCE | Primary: Family Medicine

## 2023-06-06 DIAGNOSIS — M7712 Lateral epicondylitis, left elbow: Secondary | ICD-10-CM
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
## 2023-06-08 ENCOUNTER — Inpatient Hospital Stay: Admit: 2023-06-08 | Discharge: 2023-06-08 | Payer: PRIVATE HEALTH INSURANCE | Primary: Family Medicine

## 2023-06-08 NOTE — Other (Signed)
[]  [facility]  [address] [place], [place] [address]    Physical Therapy Daily Treatment Note      Date:  06/08/2023  Patient Name:  Catherine Forbes    DOB:  Dec 18, 1970  MRN: [id number]  Physician:Douglas Constance Goltz, MD                               Insurance: [facility] 30 visits  Medical Diagnosis: left tennis elbow; left shoulder tendonitis                     Rehab Codes: M25.521, M25.512, M25.612, M25.622, R53.1  Onset Date: 03/06/23             Next Dr.'s appt: none scheduled     Visit# / total visits: 2/24  Cancels/No Shows: 0/0    Subjective:    Pain:  [x]  Yes  []  No Location: left elbow/ shoulder  Pain Rating: (0-10 scale) 0/10 at rest both shoulder and elbow- 4/10 (elbow), 1/10 (shoulder) with activity  Pain altered Tx:  []  No  [x]  Yes  Action: tried to keep pain with ex's minimal  Comments: Patient notes she is already feeling better since last session- unsure if this is due to cortisone injection or stretching ex's    Objective:  Modalities: [place], Ice  Precautions: none  Exercises:  Ball squeeze  2'       Wrist extensor stretch 3x15"       Finkelstein stretch 3x15"       AROM: left Wrist flexion/extension; RD/UD; pro/sup 10x ea.        Wrist ext isometric 10x5"                 Codman's cw/ccw 15x ea.  2#     Bent row 15x  2#     Scap retractions 10x       Seated inf glides  15"x4    added 2/14             Supine cane flexion  x15    added 2/14   Supine serratus press  x15    added 2/14   Sidelying ER  x15    added 2/14    sidelying abd  x15    added 2/14              ice to left elbow  x10'       Tendon massage left lateral elbow  x3'    added 2/14   [place] 3 mhz 100% left lateral elbow  x8' 0.8 w/cm2  added 2/14         Ionto left elbow        Other:**patient here for 2 different diagnoses (elbow, shoulder). Gave 60 mins for follow up appointments but may focus on elbow more if not enough time to treat both fully.     Specific Instructions for next treatment:   Monitor response to today's visit and home program compliance.  Advance program to patient tolerance.  Continue modalities/ STM if this proves helpful      Assessment: [x]  Progressing toward goals. Today is first full session post initial evaluation, but she is already feeling slightly better. Added several ex's, tolerated this well- without much pain. Began [place] and STM to left lat elbow as she does still have min- mod pain with lifting objects. Tenderness with STM,  but not too bad. Used ice on elbow at end of session. Patient did not complain of increased pain at end of session. Discussed importance of getting pain levels manageable with stretching and modalities then working on more aggressive strengthening.     []  No change     []  Other:    [x]  Patient would continue to benefit from skilled physical therapy services in order to:  decrease pain, improve ROM/strength, and return to normal activity including food prep, lifting, recreational activity, bathing, pushing up on hands, housework, sleeping, and opening a jar with minimal pain/difficulty.     STG/LTG  STG: (to be met in 12 treatments)  ? Pain: left elbow/shoulder to 0-2/10 to improve ability to do food prep, lifting, recreational activity, bathing, pushing up on hands, housework, sleeping, and opening a jar.    ? ROM: left UE to WNL's to improve ability to do food prep, lifting, recreational activity, bathing, pushing up on hands, housework, sleeping, and opening a jar.   ? Strength: to improve ability to do food prep, lifting, recreational activity, bathing, pushing up on hands, housework, sleeping, and opening a jar.   ? Function: left UE as evidenced by improved UEFI scores to 70/80  Patient to be independent with home exercise program as demonstrated by performance with correct form without cues.     LTG: (to be met in 24 treatments)  Patient will return to normal activity including food prep, lifting, recreational activity, bathing, pushing up on  hands, housework, sleeping, and opening a jar with minimal pain/difficulty.                        Patient goals: "stop the pain"  Pt. Education:  [x]  Yes  []  No  [x]  Reviewed Prior HEP/Ed  Method of Education: [x]  Verbal  [x]  Demo  []  Written  Comprehension of Education:  []  Verbalizes understanding.  []  Demonstrates understanding.  [x]  Needs review.  []  Demonstrates/verbalizes HEP/Ed previously given.   2/14 updated HEP with newly added ex's  Access Code: [email or web address]  URL: [email or web address]  Date: 06/08/2023  Prepared by: Marene Lenz    Exercises  - Standing Wrist Flexion Stretch  - 1-2 x daily - 7 x weekly - 4 sets - 15 seconds hold  - Finkelstein Stretch (Mirrored)  - 1-2 x daily - 7 x weekly - 4 sets - 15 seconds hold  - Wrist Extension AROM  - 1-2 x daily - 7 x weekly - 1-2 sets - 10 reps  - Wrist AROM Radial Ulnar Deviation (Mirrored)  - 1-2 x daily - 7 x weekly - 1-2 sets - 10 reps  - Seated Forearm Pronation and Supination AROM  - 1-2 x daily - 7 x weekly - 1-2 sets - 10 reps  - Isometric Wrist Extension Pronated  - 1 x daily - 7 x weekly - 1-2 sets - 10 reps - 5 seconds hold  - Circular Shoulder Pendulum with Table Support (Mirrored)  - 1-2 x daily - 7 x weekly - 2 sets - 10 reps  - Standing Bent Over Single Arm Scapular Row with Table Support (Mirrored)  - 1-2 x daily - 7 x weekly - 2 sets - 10 reps  - Seated Scapular Retraction  - 1-2 x daily - 7 x weekly - 2 sets - 10 reps  - Seated Shoulder Inferior Glide  - 3 x daily - 7 x weekly - 1 sets - 10 reps - 10"  hold  - Supine Shoulder Flexion Extension AAROM with Dowel  - 2 x daily - 7 x weekly - 1 sets - 10 reps  - Supine Scapular Protraction in Flexion with Dumbbells  - 2 x daily - 7 x weekly - 1 sets - 10 reps  - Sidelying Shoulder External Rotation  - 2 x daily - 7 x weekly - 1 sets - 10 reps  - Sidelying Shoulder Abduction Palm Forward  - 2 x daily - 7 x weekly - 1 sets - 10 reps    Access Code: [email or web address]  URL:  [email or web address]  Date: 06/06/2023  Prepared by: Marni Griffon     Exercises  - Standing Wrist Flexion Stretch  - 1-2 x daily - 7 x weekly - 4 sets - 15 seconds hold  - Radiation protection practitioner (Mirrored)  - 1-2 x daily - 7 x weekly - 4 sets - 15 seconds hold  - Wrist Extension AROM  - 1-2 x daily - 7 x weekly - 1-2 sets - 10 reps  - Wrist AROM Radial Ulnar Deviation (Mirrored)  - 1-2 x daily - 7 x weekly - 1-2 sets - 10 reps  - Seated Forearm Pronation and Supination AROM  - 1-2 x daily - 7 x weekly - 1-2 sets - 10 reps  - Isometric Wrist Extension Pronated  - 1 x daily - 7 x weekly - 1-2 sets - 10 reps - 5 seconds hold  - Circular Shoulder Pendulum with Table Support (Mirrored)  - 1-2 x daily - 7 x weekly - 2 sets - 10 reps  - Standing Bent Over Single Arm Scapular Row with Table Support (Mirrored)  - 1-2 x daily - 7 x weekly - 2 sets - 10 reps  - Seated Scapular Retraction  - 1-2 x daily - 7 x weekly - 2 sets - 10 rep  Plan: [x]  Continue per plan of care.   []  Other:      Treatment Charges: Mins Units Time in/out   [x]   Modalities [place]  Ice 8  10 1   0    [x]   Ther Exercise 39 2    [x]   Manual Therapy 3 0    []   Ther Activities      []   Aquatics      []   Neuromuscular      []  Vasocompression      []  Gait Training      []  Dry needling        []  1 or 2 muscles        []  3 or more muscles      []   Other      Total Treatment time 50 3      Time In:3:05 pm            Time Out: 4:05 pm    Electronically signed by:  Marene Lenz, PTA

## 2023-06-13 ENCOUNTER — Inpatient Hospital Stay: Admit: 2023-06-13 | Discharge: 2023-06-13 | Payer: PRIVATE HEALTH INSURANCE | Primary: Family Medicine

## 2023-06-13 NOTE — Other (Signed)
[x]  [facility]  [address] [place], [place] [address]    Physical Therapy Daily Treatment Note      Date:  06/13/2023  Patient Name:  Catherine Forbes    DOB:  Apr 03, 1971  MRN: [id number]  Physician:Douglas Constance Goltz, MD                               Insurance: [facility] 30 visits  Medical Diagnosis: left tennis elbow; left shoulder tendonitis                     Rehab Codes: M25.521, M25.512, M25.612, M25.622, R53.1  Onset Date: 03/06/23             Next Dr.'s appt: none scheduled     Visit# / total visits: 3/24  Cancels/No Shows: 0/0    Subjective:    Pain:  []  Yes  [x]  No Location: left elbow/ shoulder  Pain Rating: (0-10 scale) 0/10 at rest both shoulder and elbow-    Pain altered Tx:  []  No  [x]  Yes  Action: tried to keep pain with ex's minimal  Comments: 3/10 at worst in elbow lately.  Patient notes continued improvement in pain levels.     Past Medical History:   Diagnosis Date    Arthritis     Breast cyst August 18    Bronchitis     Carpal tunnel syndrome 03/14/2022    Carpal tunnel syndrome on both sides     Colon polyp     Diabetes mellitus (HCC)     Fatigue     GERD (gastroesophageal reflux disease)     Glucose intolerance     History of blood transfusion 1998    History of diverticulitis     History of vitamin D deficiency     Hyperlipidemia     Hypertension     IBS (irritable bowel syndrome)     Insomnia     Migraine     Obesity     OSA on CPAP     PCOS (polycystic ovarian syndrome)     PONV (postoperative nausea and vomiting)     Thyroid disease     Type 2 diabetes mellitus without complication (HCC)     Under care of service provider 03/01/2022    pcp-[facility]-last visit aug 2023    Under care of service provider 03/01/2022    sleep disorder-dr jacob[facility]-last visit nov 2023    Wears contact lenses      Past Surgical History:   Procedure Laterality Date    BREAST BIOPSY  1992    BREAST CYST EXCISION Right     Age 53    BREAST SURGERY Right      benign cyst removed     CARPAL TUNNEL RELEASE Right 03/14/2022    CARPAL TUNNEL RELEASE performed by Lorne Skeens, MD at [facility] OR    CHOLECYSTECTOMY      COLONOSCOPY N/A 05/29/2019    COLONOSCOPY WITH BIOPSY performed by Tyna Jaksch, MD at [facility] OR    COLONOSCOPY N/A 10/02/2022    COLONOSCOPY DIAGNOSTIC performed by Tery Sanfilippo, MD at [facility] ENDO    FINGER FRACTURE SURGERY  1990    FOOT FRACTURE SURGERY  2012    HERNIA REPAIR      umbilical    HYSTERECTOMY (CERVIX STATUS UNKNOWN)      HYSTERECTOMY, VAGINAL  pt has had 2 miscarriages, and a D+C also    TONSILLECTOMY      UPPER GASTROINTESTINAL ENDOSCOPY      US BREAST BIOPSY W LOC DEVICE 1ST LESION RIGHT Right 01/26/2023    US BREAST BIOPSY W LOC DEVICE 1ST LESION RIGHT 01/26/2023 [facility]       Objective:  Modalities: [place], Ice  Precautions: none  Exercises:  Ball squeeze  2'       Wrist extensor stretch 4x15"       Finkelstein stretch 4x15"       AROM: left Wrist flexion/extension; RD/UD; pro/sup 15x ea.        Wrist ext/RD isometric 10x5"    added RD isom added 2/19             Codman's cw/ccw 20x ea.  2#     Bent row 20x  2#     Scap retractions 15x       Seated inf glides  15"x4    added 2/14             Supine cane flexion  x15    added 2/14   Supine serratus press  x15    added 2/14   Sidelying ER  x15    added 2/14    sidelying abd  x15    added 2/14    Prone shoulder ext  x15    added 2/19          ice to left elbow  x10'       Tendon massage left lateral elbow  x3'    added 2/14   [place] 3 mhz 100% left lateral elbow  x8' 0.8 w/cm2  added 2/14         Ionto left elbow        Other:**patient here for 2 different diagnoses (elbow, shoulder). Gave 60 mins for follow up appointments but may focus on elbow more if not enough time to treat both fully.     Specific Instructions for next treatment:  Monitor response to today's visit and home program compliance.  Advance program to patient tolerance.  Continue modalities/ STM if this  proves helpful      Assessment: [x]  Progressing toward goals. Decrease pain reported.  Good tolerance overall to most exercises-no significant pain with any of them.  Patient did have pain with tendon massage to left elbow.  Responding well to other modalities ([place], ice).  Discussed possibly adding ionto but patient felt she was making some progress so far but would consider if she fails to make further improvements.  Improved active mobility for functional shoulder internal rotation/ext rotation and forward elevation.        []  No change     []  Other:    [x]  Patient would continue to benefit from skilled physical therapy services in order to:  decrease pain, improve ROM/strength, and return to normal activity including food prep, lifting, recreational activity, bathing, pushing up on hands, housework, sleeping, and opening a jar with minimal pain/difficulty.       STG: (to be met in 12 treatments)  ? Pain: left elbow/shoulder to 0-2/10 to improve ability to do food prep, lifting, recreational activity, bathing, pushing up on hands, housework, sleeping, and opening a jar.    ? ROM: left UE to WNL's to improve ability to do food prep, lifting, recreational activity, bathing, pushing up on hands, housework, sleeping, and opening a jar.   ? Strength: to improve ability  to do food prep, lifting, recreational activity, bathing, pushing up on hands, housework, sleeping, and opening a jar.   ? Function: left UE as evidenced by improved UEFI scores to 70/80  Patient to be independent with home exercise program as demonstrated by performance with correct form without cues.     LTG: (to be met in 24 treatments)  Patient will return to normal activity including food prep, lifting, recreational activity, bathing, pushing up on hands, housework, sleeping, and opening a jar with minimal pain/difficulty.                     Patient goals: "stop the pain"  Pt. Education:  [x]  Yes  []  No  [x]  Reviewed Prior HEP/Ed  Method of  Education: [x]  Verbal  [x]  Demo  []  Written  Comprehension of Education:  []  Verbalizes understanding.  []  Demonstrates understanding.  [x]  Needs review.  []  Demonstrates/verbalizes HEP/Ed previously given.     2/14 updated HEP with newly added ex's  Access Code: [email or web address]  URL: [email or web address]  Date: 06/08/2023  Prepared by: Marene Lenz    Exercises  - Standing Wrist Flexion Stretch  - 1-2 x daily - 7 x weekly - 4 sets - 15 seconds hold  - Finkelstein Stretch (Mirrored)  - 1-2 x daily - 7 x weekly - 4 sets - 15 seconds hold  - Wrist Extension AROM  - 1-2 x daily - 7 x weekly - 1-2 sets - 10 reps  - Wrist AROM Radial Ulnar Deviation (Mirrored)  - 1-2 x daily - 7 x weekly - 1-2 sets - 10 reps  - Seated Forearm Pronation and Supination AROM  - 1-2 x daily - 7 x weekly - 1-2 sets - 10 reps  - Isometric Wrist Extension Pronated  - 1 x daily - 7 x weekly - 1-2 sets - 10 reps - 5 seconds hold  - Circular Shoulder Pendulum with Table Support (Mirrored)  - 1-2 x daily - 7 x weekly - 2 sets - 10 reps  - Standing Bent Over Single Arm Scapular Row with Table Support (Mirrored)  - 1-2 x daily - 7 x weekly - 2 sets - 10 reps  - Seated Scapular Retraction  - 1-2 x daily - 7 x weekly - 2 sets - 10 reps  - Seated Shoulder Inferior Glide  - 3 x daily - 7 x weekly - 1 sets - 10 reps - 10" hold  - Supine Shoulder Flexion Extension AAROM with Dowel  - 2 x daily - 7 x weekly - 1 sets - 10 reps  - Supine Scapular Protraction in Flexion with Dumbbells  - 2 x daily - 7 x weekly - 1 sets - 10 reps  - Sidelying Shoulder External Rotation  - 2 x daily - 7 x weekly - 1 sets - 10 reps  - Sidelying Shoulder Abduction Palm Forward  - 2 x daily - 7 x weekly - 1 sets - 10 reps    Access Code: [email or web address]  URL: [email or web address]  Date: 06/06/2023  Prepared by: Marni Griffon     Exercises  - Standing Wrist Flexion Stretch  - 1-2 x daily - 7 x weekly - 4 sets - 15 seconds hold  - Radiation protection practitioner (Mirrored)  - 1-2 x daily - 7 x  weekly - 4 sets - 15 seconds hold  - Wrist Extension AROM  - 1-2 x daily - 7 x weekly - 1-2 sets - 10 reps  -  Wrist AROM Radial Ulnar Deviation (Mirrored)  - 1-2 x daily - 7 x weekly - 1-2 sets - 10 reps  - Seated Forearm Pronation and Supination AROM  - 1-2 x daily - 7 x weekly - 1-2 sets - 10 reps  - Isometric Wrist Extension Pronated  - 1 x daily - 7 x weekly - 1-2 sets - 10 reps - 5 seconds hold  - Circular Shoulder Pendulum with Table Support (Mirrored)  - 1-2 x daily - 7 x weekly - 2 sets - 10 reps  - Standing Bent Over Single Arm Scapular Row with Table Support (Mirrored)  - 1-2 x daily - 7 x weekly - 2 sets - 10 reps  - Seated Scapular Retraction  - 1-2 x daily - 7 x weekly - 2 sets - 10 rep    Access Code: [email or web address]  URL: [email or web address]  Date: 06/13/2023  Prepared by: Marni Griffon    Exercises  - Seated Isometric Wrist Radial Deviation with Manual Resistance  - 1 x daily - 7 x weekly - 1-2 sets - 10 reps - 5 seconds hold  - Prone Shoulder Extension - Single Arm (Mirrored)  - 1 x daily - 7 x weekly - 1-2 sets - 10 reps      Plan: [x]  Continue per plan of care.   []  Other:      Treatment Charges: Mins Units Time in/out   [x]   Modalities [place]  Ice 8  10 1   0    [x]   Ther Exercise 26 2    [x]   Manual Therapy 5 0    []   Ther Activities      []   Aquatics      []   Neuromuscular      []  Vasocompression      []  Gait Training      []  Dry needling        []  1 or 2 muscles        []  3 or more muscles      []   Other      Total Treatment time 39 3      Time In: 11:03             Time Out: 12:03    Electronically signed by:  Marni Griffon, PT

## 2023-06-15 ENCOUNTER — Inpatient Hospital Stay: Admit: 2023-06-15 | Discharge: 2023-06-15 | Payer: PRIVATE HEALTH INSURANCE | Primary: Family Medicine

## 2023-06-15 NOTE — Other (Signed)
 [x]  [facility]  [address] [place], [place] [address]    Physical Therapy Daily Treatment Note      Date:  06/15/2023  Patient Name:  Catherine Forbes    DOB:  1970/05/19  MRN: [id number]  Physician:Douglas Constance Goltz, MD                               Insurance: [employer] 30 visits  Medical Diagnosis: left tennis elbow; left shoulder tendonitis                     Rehab Codes: M25.521, M25.512, M25.612, M25.622, R53.1  Onset Date: 03/06/23             Next Dr.'s appt: none scheduled     Visit# / total visits: 4/24  Cancels/No Shows: 0/0    Subjective:    Pain:  []  Yes  [x]  No Location: left elbow/ shoulder  Pain Rating: (0-10 scale) 0/10 at rest both shoulder and elbow-    Pain altered Tx:  []  No  [x]  Yes  Action: tried to keep pain with ex's minimal  Comments: 3/10 at worst in elbow lately.  Patient notes continued improvement in pain levels for both elbow and shoulder.     Past Medical History:   Diagnosis Date    Arthritis     Breast cyst August 18    Bronchitis     Carpal tunnel syndrome 03/14/2022    Carpal tunnel syndrome on both sides     Colon polyp     Diabetes mellitus (HCC)     Fatigue     GERD (gastroesophageal reflux disease)     Glucose intolerance     History of blood transfusion 1998    History of diverticulitis     History of vitamin D deficiency     Hyperlipidemia     Hypertension     IBS (irritable bowel syndrome)     Insomnia     Migraine     Obesity     OSA on CPAP     PCOS (polycystic ovarian syndrome)     PONV (postoperative nausea and vomiting)     Thyroid disease     Type 2 diabetes mellitus without complication (HCC)     Under care of service provider 03/01/2022    pcp-[facility]-last visit aug 2023    Under care of service provider 03/01/2022    sleep disorder-dr jacob[place]-last visit nov 2023    Wears contact lenses      Past Surgical History:   Procedure Laterality Date    BREAST BIOPSY  1992    BREAST CYST EXCISION Right      Age 53    BREAST SURGERY Right     benign cyst removed     CARPAL TUNNEL RELEASE Right 03/14/2022    CARPAL TUNNEL RELEASE performed by Lorne Skeens, MD at [facility] OR    CHOLECYSTECTOMY      COLONOSCOPY N/A 05/29/2019    COLONOSCOPY WITH BIOPSY performed by Tyna Jaksch, MD at [facility] OR    COLONOSCOPY N/A 10/02/2022    COLONOSCOPY DIAGNOSTIC performed by Tery Sanfilippo, MD at [facility] ENDO    FINGER FRACTURE SURGERY  1990    FOOT FRACTURE SURGERY  2012    HERNIA REPAIR      umbilical    HYSTERECTOMY (CERVIX STATUS UNKNOWN)  HYSTERECTOMY, VAGINAL      pt has had 2 miscarriages, and a D+C also    TONSILLECTOMY      UPPER GASTROINTESTINAL ENDOSCOPY      US BREAST BIOPSY W LOC DEVICE 1ST LESION RIGHT Right 01/26/2023    US BREAST BIOPSY W LOC DEVICE 1ST LESION RIGHT 01/26/2023 [facility]       Objective:  Modalities: [place], Ice  Precautions: none  Exercises:  Ball squeeze  2'       Wrist extensor stretch 4x15"       Finkelstein stretch 4x15"       AROM: left Wrist flexion/extension; RD/UD; pro/sup 15x ea.        Wrist ext/RD isometric 10x5"    added RD isom added 2/19             Codman's cw/ccw 20x ea.  2#     Bent row 20x  2#     Scap retractions 15x  Yellow     Seated inf glides  15"x4    added 2/14    Biceps curls  15x 2#  added 2/21   Supine cane flexion  x15  2# bar  added 2/14   Supine serratus press  x15  2# bar  added 2/14   Sidelying ER  x15  1#  added 2/14    sidelying abd  x15  1#  added 2/14    Prone shoulder ext  x15  1#  added 2/19          ice to left elbow  x10'       Tendon massage left lateral elbow  x3'    added 2/14   [place] 3 mhz 100% left lateral elbow  x8' 0.8 w/cm2  added 2/14         Ionto left elbow        Other:**patient here for 2 different diagnoses (elbow, shoulder). Gave 60 mins for follow up appointments but may focus on elbow more if not enough time to treat both fully.     Specific Instructions for next treatment:  Monitor response to today's visit and home program  compliance.  Advance program to patient tolerance.  Continue modalities/ STM if this proves helpful      Assessment: [x]  Progressing toward goals. Decrease pain reported overall for both elbow/shoulder.  Good tolerance overall to most exercises-no significant pain with any of them.  Patient did have pain with tendon massage to left elbow.  Able to add light resistance to several exercises today without issues.  Responding well to other modalities ([place], ice).          []  No change     []  Other:    [x]  Patient would continue to benefit from skilled physical therapy services in order to:  decrease pain, improve ROM/strength, and return to normal activity including food prep, lifting, recreational activity, bathing, pushing up on hands, housework, sleeping, and opening a jar with minimal pain/difficulty.       STG: (to be met in 12 treatments)  ? Pain: left elbow/shoulder to 0-2/10 to improve ability to do food prep, lifting, recreational activity, bathing, pushing up on hands, housework, sleeping, and opening a jar.    ? ROM: left UE to WNL's to improve ability to do food prep, lifting, recreational activity, bathing, pushing up on hands, housework, sleeping, and opening a jar.   ? Strength: to improve ability to do food prep, lifting, recreational activity, bathing, pushing up  on hands, housework, sleeping, and opening a jar.   ? Function: left UE as evidenced by improved UEFI scores to 70/80  Patient to be independent with home exercise program as demonstrated by performance with correct form without cues.     LTG: (to be met in 24 treatments)  Patient will return to normal activity including food prep, lifting, recreational activity, bathing, pushing up on hands, housework, sleeping, and opening a jar with minimal pain/difficulty.                     Patient goals: "stop the pain"  Pt. Education:  [x]  Yes  []  No  [x]  Reviewed Prior HEP/Ed  Method of Education: [x]  Verbal  [x]  Demo  []  Written  Comprehension of  Education:  []  Verbalizes understanding.  []  Demonstrates understanding.  [x]  Needs review.  []  Demonstrates/verbalizes HEP/Ed previously given.     2/14 updated HEP with newly added ex's  Access Code: [email or web address]  URL: [email or web address]  Date: 06/08/2023  Prepared by: Marene Lenz    Exercises  - Standing Wrist Flexion Stretch  - 1-2 x daily - 7 x weekly - 4 sets - 15 seconds hold  - Finkelstein Stretch (Mirrored)  - 1-2 x daily - 7 x weekly - 4 sets - 15 seconds hold  - Wrist Extension AROM  - 1-2 x daily - 7 x weekly - 1-2 sets - 10 reps  - Wrist AROM Radial Ulnar Deviation (Mirrored)  - 1-2 x daily - 7 x weekly - 1-2 sets - 10 reps  - Seated Forearm Pronation and Supination AROM  - 1-2 x daily - 7 x weekly - 1-2 sets - 10 reps  - Isometric Wrist Extension Pronated  - 1 x daily - 7 x weekly - 1-2 sets - 10 reps - 5 seconds hold  - Circular Shoulder Pendulum with Table Support (Mirrored)  - 1-2 x daily - 7 x weekly - 2 sets - 10 reps  - Standing Bent Over Single Arm Scapular Row with Table Support (Mirrored)  - 1-2 x daily - 7 x weekly - 2 sets - 10 reps  - Seated Scapular Retraction  - 1-2 x daily - 7 x weekly - 2 sets - 10 reps  - Seated Shoulder Inferior Glide  - 3 x daily - 7 x weekly - 1 sets - 10 reps - 10" hold  - Supine Shoulder Flexion Extension AAROM with Dowel  - 2 x daily - 7 x weekly - 1 sets - 10 reps  - Supine Scapular Protraction in Flexion with Dumbbells  - 2 x daily - 7 x weekly - 1 sets - 10 reps  - Sidelying Shoulder External Rotation  - 2 x daily - 7 x weekly - 1 sets - 10 reps  - Sidelying Shoulder Abduction Palm Forward  - 2 x daily - 7 x weekly - 1 sets - 10 reps    Access Code: [email or web address]  URL: [email or web address]  Date: 06/06/2023  Prepared by: Marni Griffon     Exercises  - Standing Wrist Flexion Stretch  - 1-2 x daily - 7 x weekly - 4 sets - 15 seconds hold  - Radiation protection practitioner (Mirrored)  - 1-2 x daily - 7 x weekly - 4 sets - 15 seconds hold  - Wrist Extension AROM  -  1-2 x daily - 7 x weekly - 1-2 sets - 10 reps  - Wrist AROM Radial Ulnar Deviation (Mirrored)  - 1-2  x daily - 7 x weekly - 1-2 sets - 10 reps  - Seated Forearm Pronation and Supination AROM  - 1-2 x daily - 7 x weekly - 1-2 sets - 10 reps  - Isometric Wrist Extension Pronated  - 1 x daily - 7 x weekly - 1-2 sets - 10 reps - 5 seconds hold  - Circular Shoulder Pendulum with Table Support (Mirrored)  - 1-2 x daily - 7 x weekly - 2 sets - 10 reps  - Standing Bent Over Single Arm Scapular Row with Table Support (Mirrored)  - 1-2 x daily - 7 x weekly - 2 sets - 10 reps  - Seated Scapular Retraction  - 1-2 x daily - 7 x weekly - 2 sets - 10 rep    Access Code: [email or web address]  URL: [email or web address]  Date: 06/13/2023  Prepared by: Marni Griffon    Exercises  - Seated Isometric Wrist Radial Deviation with Manual Resistance  - 1 x daily - 7 x weekly - 1-2 sets - 10 reps - 5 seconds hold  - Prone Shoulder Extension - Single Arm (Mirrored)  - 1 x daily - 7 x weekly - 1-2 sets - 10 reps      Plan: [x]  Continue per plan of care.   []  Other:      Treatment Charges: Mins Units Time in/out   [x]   Modalities [place]  Ice 8  10 1   0    [x]   Ther Exercise 38 2    [x]   Manual Therapy 5 0    []   Ther Activities      []   Aquatics      []   Neuromuscular      []  Vasocompression      []  Gait Training      []  Dry needling        []  1 or 2 muscles        []  3 or more muscles      []   Other      Total Treatment time 51 3      Time In: 3:02         Time Out: 4:07    Electronically signed by:  Marni Griffon, PT

## 2023-06-18 NOTE — Telephone Encounter (Signed)
Error

## 2023-06-19 ENCOUNTER — Inpatient Hospital Stay: Admit: 2023-06-19 | Discharge: 2023-06-19 | Payer: PRIVATE HEALTH INSURANCE | Primary: Family Medicine

## 2023-06-19 NOTE — Other (Signed)
 [x]  [facility]  [address] [place], [place] [address]    Physical Therapy Daily Treatment Note      Date:  06/19/2023  Patient Name:  Catherine Forbes    DOB:  1970-12-11  MRN: [id number]  Physician:Douglas Constance Goltz, MD                               Insurance: [facility] 30 visits  Medical Diagnosis: left tennis elbow; left shoulder tendonitis                     Rehab Codes: M25.521, M25.512, M25.612, M25.622, R53.1  Onset Date: 03/06/23             Next Dr.'s appt: none scheduled     Visit# / total visits: 5/24  Cancels/No Shows: 0/0    Subjective:    Pain:  []  Yes  [x]  No Location: left elbow/ shoulder  Pain Rating: (0-10 scale) 2/10 with movement.      Pain altered Tx:  []  No  [x]  Yes  Action: tried to keep pain with ex's minimal  Comments: Shoulder is doing well- a little tenderness last night.  Bumped her elbow today and caused some pain.     Past Medical History:   Diagnosis Date    Arthritis     Breast cyst August 18    Bronchitis     Carpal tunnel syndrome 03/14/2022    Carpal tunnel syndrome on both sides     Colon polyp     Diabetes mellitus (HCC)     Fatigue     GERD (gastroesophageal reflux disease)     Glucose intolerance     History of blood transfusion 1998    History of diverticulitis     History of vitamin D deficiency     Hyperlipidemia     Hypertension     IBS (irritable bowel syndrome)     Insomnia     Migraine     Obesity     OSA on CPAP     PCOS (polycystic ovarian syndrome)     PONV (postoperative nausea and vomiting)     Thyroid disease     Type 2 diabetes mellitus without complication (HCC)     Under care of service provider 03/01/2022    pcp-[facility]-last visit aug 2023    Under care of service provider 03/01/2022    sleep disorder-dr jacob[place]-last visit nov 2023    Wears contact lenses      Past Surgical History:   Procedure Laterality Date    BREAST BIOPSY  1992    BREAST CYST EXCISION Right     Age 53    BREAST SURGERY  Right     benign cyst removed     CARPAL TUNNEL RELEASE Right 03/14/2022    CARPAL TUNNEL RELEASE performed by Lorne Skeens, MD at [facility] OR    CHOLECYSTECTOMY      COLONOSCOPY N/A 05/29/2019    COLONOSCOPY WITH BIOPSY performed by Tyna Jaksch, MD at [facility] OR    COLONOSCOPY N/A 10/02/2022    COLONOSCOPY DIAGNOSTIC performed by Tery Sanfilippo, MD at [facility] ENDO    FINGER FRACTURE SURGERY  1990    FOOT FRACTURE SURGERY  2012    HERNIA REPAIR      umbilical    HYSTERECTOMY (CERVIX STATUS UNKNOWN)      HYSTERECTOMY,  VAGINAL      pt has had 2 miscarriages, and a D+C also    TONSILLECTOMY      UPPER GASTROINTESTINAL ENDOSCOPY      US BREAST BIOPSY W LOC DEVICE 1ST LESION RIGHT Right 01/26/2023    US BREAST BIOPSY W LOC DEVICE 1ST LESION RIGHT 01/26/2023 [facility]       Objective:  Modalities: [place], Ice  Precautions: none  Exercises:  Ball squeeze  2'       Wrist extensor stretch 4x15"       Finkelstein stretch 4x15"       AROM: left Wrist flexion/extension; RD/UD; pro/sup 15x ea.   1# (flex/ext); green roller (others)     Wrist ext/RD isometric 10x5"    added RD isom added 2/19    Bike  2'    added 2/25   Codman's cw/ccw 20x ea.  2#     Bent row 20x  2#     Scap retractions 15x  Yellow     Seated inf glides  15"x4    added 2/14    Biceps curls palm up/neutral  20x 2#  added 2/21  Added palm neutral 2/25   Supine cane flexion  x15  2# bar  added 2/14   Supine serratus press  x15  2# bar  added 2/14   Supine triceps x15  1# Added 2/25   Sidelying ER  x15  1#  added 2/14    sidelying abd  x15  1#  added 2/14    Prone shoulder ext  x15  1#  added 2/19          ice to left elbow  x10'       Tendon massage left lateral elbow  x5'    added 2/14   *roller x2 mins   [place] 3 mhz 100% left lateral elbow  x8' 0.8 w/cm2  added 2/14         Ionto left elbow        Other:**patient here for 2 different diagnoses (elbow, shoulder). Gave 60 mins for follow up appointments but may focus on elbow more if not enough time to  treat both fully.     Specific Instructions for next treatment:  Monitor response to today's visit and home program compliance.  Advance program to patient tolerance.  Continue modalities/ STM if this proves helpful      Assessment: [x]  Progressing toward goals. Decrease pain reported overall for both elbow/shoulder.  Added bike for left UE, resistance for wrist exercises, biceps curls (palm neutral), and supine triceps extension.  Good tolerance overall to most exercises-no significant pain with any of them.  Patient did have pain with tendon massage to left elbow.  Did a few minutes with the massage roller to forearm extensor muscles.             []  No change     []  Other:    [x]  Patient would continue to benefit from skilled physical therapy services in order to:  decrease pain, improve ROM/strength, and return to normal activity including food prep, lifting, recreational activity, bathing, pushing up on hands, housework, sleeping, and opening a jar with minimal pain/difficulty.       STG: (to be met in 12 treatments)  ? Pain: left elbow/shoulder to 0-2/10 to improve ability to do food prep, lifting, recreational activity, bathing, pushing up on hands, housework, sleeping, and opening a jar.    ? ROM: left UE to  WNL's to improve ability to do food prep, lifting, recreational activity, bathing, pushing up on hands, housework, sleeping, and opening a jar.   ? Strength: to improve ability to do food prep, lifting, recreational activity, bathing, pushing up on hands, housework, sleeping, and opening a jar.   ? Function: left UE as evidenced by improved UEFI scores to 70/80  Patient to be independent with home exercise program as demonstrated by performance with correct form without cues.     LTG: (to be met in 24 treatments)  Patient will return to normal activity including food prep, lifting, recreational activity, bathing, pushing up on hands, housework, sleeping, and opening a jar with minimal pain/difficulty.                      Patient goals: "stop the pain"  Pt. Education:  [x]  Yes  []  No  [x]  Reviewed Prior HEP/Ed  Method of Education: [x]  Verbal  [x]  Demo  []  Written  Comprehension of Education:  []  Verbalizes understanding.  []  Demonstrates understanding.  [x]  Needs review.  []  Demonstrates/verbalizes HEP/Ed previously given.     2/14 updated HEP with newly added ex's  Access Code: [email or web address]  URL: [email or web address]  Date: 06/08/2023  Prepared by: Marene Lenz    Exercises  - Standing Wrist Flexion Stretch  - 1-2 x daily - 7 x weekly - 4 sets - 15 seconds hold  - Finkelstein Stretch (Mirrored)  - 1-2 x daily - 7 x weekly - 4 sets - 15 seconds hold  - Wrist Extension AROM  - 1-2 x daily - 7 x weekly - 1-2 sets - 10 reps  - Wrist AROM Radial Ulnar Deviation (Mirrored)  - 1-2 x daily - 7 x weekly - 1-2 sets - 10 reps  - Seated Forearm Pronation and Supination AROM  - 1-2 x daily - 7 x weekly - 1-2 sets - 10 reps  - Isometric Wrist Extension Pronated  - 1 x daily - 7 x weekly - 1-2 sets - 10 reps - 5 seconds hold  - Circular Shoulder Pendulum with Table Support (Mirrored)  - 1-2 x daily - 7 x weekly - 2 sets - 10 reps  - Standing Bent Over Single Arm Scapular Row with Table Support (Mirrored)  - 1-2 x daily - 7 x weekly - 2 sets - 10 reps  - Seated Scapular Retraction  - 1-2 x daily - 7 x weekly - 2 sets - 10 reps  - Seated Shoulder Inferior Glide  - 3 x daily - 7 x weekly - 1 sets - 10 reps - 10" hold  - Supine Shoulder Flexion Extension AAROM with Dowel  - 2 x daily - 7 x weekly - 1 sets - 10 reps  - Supine Scapular Protraction in Flexion with Dumbbells  - 2 x daily - 7 x weekly - 1 sets - 10 reps  - Sidelying Shoulder External Rotation  - 2 x daily - 7 x weekly - 1 sets - 10 reps  - Sidelying Shoulder Abduction Palm Forward  - 2 x daily - 7 x weekly - 1 sets - 10 reps    Access Code: [email or web address]  URL: [email or web address]  Date: 06/06/2023  Prepared by: Marni Griffon     Exercises  - Standing Wrist Flexion  Stretch  - 1-2 x daily - 7 x weekly - 4 sets - 15 seconds hold  - Finkelstein Stretch (Mirrored)  - 1-2 x daily - 7 x  weekly - 4 sets - 15 seconds hold  - Wrist Extension AROM  - 1-2 x daily - 7 x weekly - 1-2 sets - 10 reps  - Wrist AROM Radial Ulnar Deviation (Mirrored)  - 1-2 x daily - 7 x weekly - 1-2 sets - 10 reps  - Seated Forearm Pronation and Supination AROM  - 1-2 x daily - 7 x weekly - 1-2 sets - 10 reps  - Isometric Wrist Extension Pronated  - 1 x daily - 7 x weekly - 1-2 sets - 10 reps - 5 seconds hold  - Circular Shoulder Pendulum with Table Support (Mirrored)  - 1-2 x daily - 7 x weekly - 2 sets - 10 reps  - Standing Bent Over Single Arm Scapular Row with Table Support (Mirrored)  - 1-2 x daily - 7 x weekly - 2 sets - 10 reps  - Seated Scapular Retraction  - 1-2 x daily - 7 x weekly - 2 sets - 10 rep    Access Code: [email or web address]  URL: [email or web address]  Date: 06/13/2023  Prepared by: Marni Griffon    Exercises  - Seated Isometric Wrist Radial Deviation with Manual Resistance  - 1 x daily - 7 x weekly - 1-2 sets - 10 reps - 5 seconds hold  - Prone Shoulder Extension - Single Arm (Mirrored)  - 1 x daily - 7 x weekly - 1-2 sets - 10 reps      Plan: [x]  Continue per plan of care.   []  Other:      Treatment Charges: Mins Units Time in/out   [x]   Modalities [place]  Ice 8  - 1  -    [x]   Ther Exercise 35 2    [x]   Manual Therapy 5 0    []   Ther Activities      []   Aquatics      []   Neuromuscular      []  Vasocompression      []  Gait Training      []  Dry needling        []  1 or 2 muscles        []  3 or more muscles      []   Other      Total Treatment time 48 3      Time In: 1:30         Time Out: 2:20    Electronically signed by:  Marni Griffon, PT

## 2023-06-22 ENCOUNTER — Inpatient Hospital Stay: Admit: 2023-06-22 | Discharge: 2023-06-22 | Payer: PRIVATE HEALTH INSURANCE | Primary: Family Medicine

## 2023-06-22 NOTE — Other (Signed)
 [x]  [facility]  [address] [place], [place] [address]    Physical Therapy Daily Treatment Note      Date:  06/22/2023  Patient Name:  Catherine Forbes    DOB:  04/01/1971  MRN: [id number]  Physician:Douglas Constance Goltz, MD                               Insurance: [facility] 30 visits  Medical Diagnosis: left tennis elbow; left shoulder tendonitis                     Rehab Codes: M25.521, M25.512, M25.612, M25.622, R53.1  Onset Date: 03/06/23             Next Dr.'s appt: none scheduled     Visit# / total visits: 6/24  Cancels/No Shows: 0/0    Subjective:    Pain:  []  Yes  [x]  No Location: left elbow/ shoulder  Pain Rating: (0-10 scale) 1/10 with movement.      Pain altered Tx:  []  No  [x]  Yes  Action: tried to keep pain with ex's minimal  Comments: 2/10 at worst lately.  Was sore after last visit.       Past Medical History:   Diagnosis Date    Arthritis     Breast cyst August 18    Bronchitis     Carpal tunnel syndrome 03/14/2022    Carpal tunnel syndrome on both sides     Colon polyp     Diabetes mellitus (HCC)     Fatigue     GERD (gastroesophageal reflux disease)     Glucose intolerance     History of blood transfusion 1998    History of diverticulitis     History of vitamin D deficiency     Hyperlipidemia     Hypertension     IBS (irritable bowel syndrome)     Insomnia     Migraine     Obesity     OSA on CPAP     PCOS (polycystic ovarian syndrome)     PONV (postoperative nausea and vomiting)     Thyroid disease     Type 2 diabetes mellitus without complication (HCC)     Under care of service provider 03/01/2022    pcp-[facility]-[place]-last visit aug 2023    Under care of service provider 03/01/2022    sleep disorder-dr jacob-[place]-last visit nov 2023    Wears contact lenses      Past Surgical History:   Procedure Laterality Date    BREAST BIOPSY  1992    BREAST CYST EXCISION Right     Age 53    BREAST SURGERY Right     benign cyst removed     CARPAL TUNNEL  RELEASE Right 03/14/2022    CARPAL TUNNEL RELEASE performed by Lorne Skeens, MD at [facility] OR    CHOLECYSTECTOMY      COLONOSCOPY N/A 05/29/2019    COLONOSCOPY WITH BIOPSY performed by Tyna Jaksch, MD at [facility] OR    COLONOSCOPY N/A 10/02/2022    COLONOSCOPY DIAGNOSTIC performed by Tery Sanfilippo, MD at [facility] ENDO    FINGER FRACTURE SURGERY  1990    FOOT FRACTURE SURGERY  2012    HERNIA REPAIR      umbilical    HYSTERECTOMY (CERVIX STATUS UNKNOWN)      HYSTERECTOMY, VAGINAL  pt has had 2 miscarriages, and a D+C also    TONSILLECTOMY      UPPER GASTROINTESTINAL ENDOSCOPY      US BREAST BIOPSY W LOC DEVICE 1ST LESION RIGHT Right 01/26/2023    US BREAST BIOPSY W LOC DEVICE 1ST LESION RIGHT 01/26/2023 [facility]       Objective:  Modalities: [place], Ice  Precautions: none  Exercises:  Gripper 18x  Red     Wrist extensor stretch 4x15"       Finkelstein stretch 4x15"       AROM: left Wrist flexion/extension; RD/UD; pro/sup 15x ea.   1# (flex/ext); green roller (others)     Wrist ext/RD isometric 10x5"    added RD isom added 2/19    Bike  2'    added 2/25   Codman's cw/ccw 20x ea.  2#     Bent row 20x  2#     Low row 15x  Green     Seated inf glides  15"x4    added 2/14   Biceps curls palm up/neutral  20x 2#  added 2/21  Added palm neutral 2/25   Supine cane flexion  x20  2# bar  added 2/14   Supine serratus press  x20  2# bar  added 2/14   Supine triceps x20  1# Added 2/25   Sidelying ER  x20  1#  added 2/14    sidelying abd  x20  1#  added 2/14    Prone shoulder ext  x15  1#  added 2/19          ice to left elbow  x10'       Tendon massage left lateral elbow  x5'    added 2/14   *roller x2 mins   [place] 3 mhz 100% left lateral elbow  x8' 0.8 w/cm2  added 2/14         Ionto left elbow        Other:**patient here for 2 different diagnoses (elbow, shoulder). Gave 60 mins for follow up appointments but may focus on elbow more if not enough time to treat both fully.     Specific Instructions for next  treatment:  Monitor response to today's visit and home program compliance.  Advance program to patient tolerance.  Continue modalities/ STM if this proves helpful      Assessment: [x]  Progressing toward goals.  Some increased pain in both shoulder/elbow since last visit.  No changes made to program (except increased to green band for rows).  Good tolerance overall to most exercises-no significant pain with any of them.  Patient did have pain with tendon massage to left elbow.  Some palpable myofascial restrictions in her forearm extensors.   If pain levels don't improve enough, may still consider ionto.        []  No change     []  Other:    [x]  Patient would continue to benefit from skilled physical therapy services in order to:  decrease pain, improve ROM/strength, and return to normal activity including food prep, lifting, recreational activity, bathing, pushing up on hands, housework, sleeping, and opening a jar with minimal pain/difficulty.       STG: (to be met in 12 treatments)  ? Pain: left elbow/shoulder to 0-2/10 to improve ability to do food prep, lifting, recreational activity, bathing, pushing up on hands, housework, sleeping, and opening a jar.    ? ROM: left UE to WNL's to improve ability to do food prep,  lifting, recreational activity, bathing, pushing up on hands, housework, sleeping, and opening a jar.   ? Strength: to improve ability to do food prep, lifting, recreational activity, bathing, pushing up on hands, housework, sleeping, and opening a jar.   ? Function: left UE as evidenced by improved UEFI scores to 70/80  Patient to be independent with home exercise program as demonstrated by performance with correct form without cues.     LTG: (to be met in 24 treatments)  Patient will return to normal activity including food prep, lifting, recreational activity, bathing, pushing up on hands, housework, sleeping, and opening a jar with minimal pain/difficulty.                     Patient goals: "stop  the pain"  Pt. Education:  [x]  Yes  []  No  [x]  Reviewed Prior HEP/Ed  Method of Education: [x]  Verbal  [x]  Demo  []  Written  Comprehension of Education:  []  Verbalizes understanding.  []  Demonstrates understanding.  [x]  Needs review.  []  Demonstrates/verbalizes HEP/Ed previously given.     2/14 updated HEP with newly added ex's  Access Code: [email or web address]  URL: [email or web address]  Date: 06/08/2023  Prepared by: Marene Lenz    Exercises  - Standing Wrist Flexion Stretch  - 1-2 x daily - 7 x weekly - 4 sets - 15 seconds hold  - Finkelstein Stretch (Mirrored)  - 1-2 x daily - 7 x weekly - 4 sets - 15 seconds hold  - Wrist Extension AROM  - 1-2 x daily - 7 x weekly - 1-2 sets - 10 reps  - Wrist AROM Radial Ulnar Deviation (Mirrored)  - 1-2 x daily - 7 x weekly - 1-2 sets - 10 reps  - Seated Forearm Pronation and Supination AROM  - 1-2 x daily - 7 x weekly - 1-2 sets - 10 reps  - Isometric Wrist Extension Pronated  - 1 x daily - 7 x weekly - 1-2 sets - 10 reps - 5 seconds hold  - Circular Shoulder Pendulum with Table Support (Mirrored)  - 1-2 x daily - 7 x weekly - 2 sets - 10 reps  - Standing Bent Over Single Arm Scapular Row with Table Support (Mirrored)  - 1-2 x daily - 7 x weekly - 2 sets - 10 reps  - Seated Scapular Retraction  - 1-2 x daily - 7 x weekly - 2 sets - 10 reps  - Seated Shoulder Inferior Glide  - 3 x daily - 7 x weekly - 1 sets - 10 reps - 10" hold  - Supine Shoulder Flexion Extension AAROM with Dowel  - 2 x daily - 7 x weekly - 1 sets - 10 reps  - Supine Scapular Protraction in Flexion with Dumbbells  - 2 x daily - 7 x weekly - 1 sets - 10 reps  - Sidelying Shoulder External Rotation  - 2 x daily - 7 x weekly - 1 sets - 10 reps  - Sidelying Shoulder Abduction Palm Forward  - 2 x daily - 7 x weekly - 1 sets - 10 reps    Access Code: [email or web address]  URL: [email or web address]  Date: 06/06/2023  Prepared by: Marni Griffon     Exercises  - Standing Wrist Flexion Stretch  - 1-2 x daily - 7 x weekly -  4 sets - 15 seconds hold  - Finkelstein Stretch (Mirrored)  - 1-2 x daily - 7 x weekly - 4 sets - 15 seconds hold  -  Wrist Extension AROM  - 1-2 x daily - 7 x weekly - 1-2 sets - 10 reps  - Wrist AROM Radial Ulnar Deviation (Mirrored)  - 1-2 x daily - 7 x weekly - 1-2 sets - 10 reps  - Seated Forearm Pronation and Supination AROM  - 1-2 x daily - 7 x weekly - 1-2 sets - 10 reps  - Isometric Wrist Extension Pronated  - 1 x daily - 7 x weekly - 1-2 sets - 10 reps - 5 seconds hold  - Circular Shoulder Pendulum with Table Support (Mirrored)  - 1-2 x daily - 7 x weekly - 2 sets - 10 reps  - Standing Bent Over Single Arm Scapular Row with Table Support (Mirrored)  - 1-2 x daily - 7 x weekly - 2 sets - 10 reps  - Seated Scapular Retraction  - 1-2 x daily - 7 x weekly - 2 sets - 10 rep    Access Code: [email or web address]  URL: [email or web address]  Date: 06/13/2023  Prepared by: Marni Griffon    Exercises  - Seated Isometric Wrist Radial Deviation with Manual Resistance  - 1 x daily - 7 x weekly - 1-2 sets - 10 reps - 5 seconds hold  - Prone Shoulder Extension - Single Arm (Mirrored)  - 1 x daily - 7 x weekly - 1-2 sets - 10 reps      Plan: [x]  Continue per plan of care.   []  Other:      Treatment Charges: Mins Units Time in/out   [x]   Modalities [place]  Ice 8  - 1  -    [x]   Ther Exercise 30 2    [x]   Manual Therapy 5 0    []   Ther Activities      []   Aquatics      []   Neuromuscular      []  Vasocompression      []  Gait Training      []  Dry needling        []  1 or 2 muscles        []  3 or more muscles      []   Other      Total Treatment time 43 3      Time In: 1:31          Time Out: 2:29    Electronically signed by:  Marni Griffon, PT

## 2023-06-25 ENCOUNTER — Inpatient Hospital Stay: Admit: 2023-06-25 | Discharge: 2023-06-25 | Payer: PRIVATE HEALTH INSURANCE | Primary: Family Medicine

## 2023-06-25 DIAGNOSIS — M7712 Lateral epicondylitis, left elbow: Secondary | ICD-10-CM

## 2023-06-25 NOTE — Other (Signed)
 [x]  [facility]  [address] [place], [place] [address]    Physical Therapy Daily Treatment Note      Date:  06/25/2023  Patient Name:  Catherine Forbes    DOB:  [birth year]  MRN: [id number]  Physician:Douglas Constance Goltz, MD                               Insurance: [facility] 30 visits  Medical Diagnosis: left tennis elbow; left shoulder tendonitis                     Rehab Codes: M25.521, M25.512, M25.612, M25.622, R53.1  Onset Date: 03/06/23             Next Dr.'s appt: none scheduled     Visit# / total visits: 7/24  Cancels/No Shows: 0/0    Subjective:    Pain:  []  Yes  [x]  No Location: left elbow/ shoulder  Pain Rating: (0-10 scale) 1/10 with movement.      Pain altered Tx:  []  No  [x]  Yes  Action: tried to keep pain with ex's minimal  Comments: Continued pain with lifting objects.  Not too bad overall lately.        Past Medical History:   Diagnosis Date    Arthritis     Breast cyst August 18    Bronchitis     Carpal tunnel syndrome 03/14/2022    Carpal tunnel syndrome on both sides     Colon polyp     Diabetes mellitus (HCC)     Fatigue     GERD (gastroesophageal reflux disease)     Glucose intolerance     History of blood transfusion 1998    History of diverticulitis     History of vitamin D deficiency     Hyperlipidemia     Hypertension     IBS (irritable bowel syndrome)     Insomnia     Migraine     Obesity     OSA on CPAP     PCOS (polycystic ovarian syndrome)     PONV (postoperative nausea and vomiting)     Thyroid disease     Type 2 diabetes mellitus without complication (HCC)     Under care of service provider 03/01/2022    pcp-[facility]-last visit aug 2023    Under care of service provider 03/01/2022    sleep disorder-dr jacob[place]-last visit nov 2023    Wears contact lenses      Past Surgical History:   Procedure Laterality Date    BREAST BIOPSY  1992    BREAST CYST EXCISION Right     Age 41    BREAST SURGERY Right     benign cyst removed      CARPAL TUNNEL RELEASE Right 03/14/2022    CARPAL TUNNEL RELEASE performed by Lorne Skeens, MD at [facility] OR    CHOLECYSTECTOMY      COLONOSCOPY N/A 05/29/2019    COLONOSCOPY WITH BIOPSY performed by Tyna Jaksch, MD at [facility] OR    COLONOSCOPY N/A 10/02/2022    COLONOSCOPY DIAGNOSTIC performed by Tery Sanfilippo, MD at [facility]    FINGER FRACTURE SURGERY  1990    FOOT FRACTURE SURGERY  2012    HERNIA REPAIR      umbilical    HYSTERECTOMY (CERVIX STATUS UNKNOWN)      HYSTERECTOMY, VAGINAL  pt has had 2 miscarriages, and a D+C also    TONSILLECTOMY      UPPER GASTROINTESTINAL ENDOSCOPY      US BREAST BIOPSY W LOC DEVICE 1ST LESION RIGHT Right 01/26/2023    US BREAST BIOPSY W LOC DEVICE 1ST LESION RIGHT 01/26/2023 [facility]       Objective:  Modalities: [place], Ice  Precautions: none  Exercises:  Gripper 18x  Red     Wrist extensor stretch 4x15"       Finkelstein stretch 4x15"       AROM: left Wrist flexion/extension; RD/UD; pro/sup 15x ea.   1# (flex/ext); green roller (others)     Wrist ext/RD isometric 10x5"    added RD isom added 2/19    Bike  2'    added 2/25   Codman's cw/ccw 20x ea.  2#     Bent row 20x  2#     Overhead press 15x 2# Added 3/3   Low row 15x  Green     Seated inf glides  15"x4    added 2/14   Biceps curls palm up/neutral  20x 2#  added 2/21  Added palm neutral 2/25   Supine cane flexion  x20  2# bar  added 2/14   Supine serratus press  x20  2# bar  added 2/14   Supine triceps x20  2# Added 2/25   Sidelying ER  x20  2#  added 2/14    sidelying abd  x20  2#  added 2/14   Sidelying hor abd x15 2# Added 3/3    Prone shoulder ext  x15  2#  added 2/19          ice to left elbow  x10'       Tendon massage left lateral elbow  x5'    added 2/14   *roller x2 mins   [place] 3 mhz 100% left lateral elbow  x8' 0.8 w/cm2  added 2/14         Ionto left elbow        Other:**patient here for 2 different diagnoses (elbow, shoulder). Gave 60 mins for follow up appointments but may focus on elbow  more if not enough time to treat both fully.     Specific Instructions for next treatment:  Monitor response to today's visit and home program compliance.  Advance program to patient tolerance.  Continue modalities/ STM if this proves helpful      Assessment: [x]  Progressing toward goals.  Added shld press overhead and sidelying hor abd with 2 lb wgt for improved strengthening.  Able to increase to 2 lbs for a few other exercises.  No adverse effects overall after treatment.  Some increased elbow soreness.  Patient did have pain with tendon massage to left elbow.  Some palpable myofascial restrictions in her forearm extensors.      []  No change     []  Other:    [x]  Patient would continue to benefit from skilled physical therapy services in order to:  decrease pain, improve ROM/strength, and return to normal activity including food prep, lifting, recreational activity, bathing, pushing up on hands, housework, sleeping, and opening a jar with minimal pain/difficulty.       STG: (to be met in 12 treatments)  ? Pain: left elbow/shoulder to 0-2/10 to improve ability to do food prep, lifting, recreational activity, bathing, pushing up on hands, housework, sleeping, and opening a jar.    ? ROM: left UE to WNL's  to improve ability to do food prep, lifting, recreational activity, bathing, pushing up on hands, housework, sleeping, and opening a jar.   ? Strength: to improve ability to do food prep, lifting, recreational activity, bathing, pushing up on hands, housework, sleeping, and opening a jar.   ? Function: left UE as evidenced by improved UEFI scores to 70/80  Patient to be independent with home exercise program as demonstrated by performance with correct form without cues.     LTG: (to be met in 24 treatments)  Patient will return to normal activity including food prep, lifting, recreational activity, bathing, pushing up on hands, housework, sleeping, and opening a jar with minimal pain/difficulty.                      Patient goals: "stop the pain"  Pt. Education:  [x]  Yes  []  No  [x]  Reviewed Prior HEP/Ed  Method of Education: [x]  Verbal  [x]  Demo  []  Written  Comprehension of Education:  []  Verbalizes understanding.  []  Demonstrates understanding.  [x]  Needs review.  []  Demonstrates/verbalizes HEP/Ed previously given.     2/14 updated HEP with newly added ex's  Access Code: [email or web address]  URL: [email or web address]  Date: 06/08/2023  Prepared by: Marene Lenz    Exercises  - Standing Wrist Flexion Stretch  - 1-2 x daily - 7 x weekly - 4 sets - 15 seconds hold  - Finkelstein Stretch (Mirrored)  - 1-2 x daily - 7 x weekly - 4 sets - 15 seconds hold  - Wrist Extension AROM  - 1-2 x daily - 7 x weekly - 1-2 sets - 10 reps  - Wrist AROM Radial Ulnar Deviation (Mirrored)  - 1-2 x daily - 7 x weekly - 1-2 sets - 10 reps  - Seated Forearm Pronation and Supination AROM  - 1-2 x daily - 7 x weekly - 1-2 sets - 10 reps  - Isometric Wrist Extension Pronated  - 1 x daily - 7 x weekly - 1-2 sets - 10 reps - 5 seconds hold  - Circular Shoulder Pendulum with Table Support (Mirrored)  - 1-2 x daily - 7 x weekly - 2 sets - 10 reps  - Standing Bent Over Single Arm Scapular Row with Table Support (Mirrored)  - 1-2 x daily - 7 x weekly - 2 sets - 10 reps  - Seated Scapular Retraction  - 1-2 x daily - 7 x weekly - 2 sets - 10 reps  - Seated Shoulder Inferior Glide  - 3 x daily - 7 x weekly - 1 sets - 10 reps - 10" hold  - Supine Shoulder Flexion Extension AAROM with Dowel  - 2 x daily - 7 x weekly - 1 sets - 10 reps  - Supine Scapular Protraction in Flexion with Dumbbells  - 2 x daily - 7 x weekly - 1 sets - 10 reps  - Sidelying Shoulder External Rotation  - 2 x daily - 7 x weekly - 1 sets - 10 reps  - Sidelying Shoulder Abduction Palm Forward  - 2 x daily - 7 x weekly - 1 sets - 10 reps    Access Code: [email or web address]  URL: [email or web address]  Date: 06/06/2023  Prepared by: Marni Griffon     Exercises  - Standing Wrist Flexion Stretch  - 1-2  x daily - 7 x weekly - 4 sets - 15 seconds hold  - Finkelstein Stretch (Mirrored)  - 1-2 x daily - 7 x weekly -  4 sets - 15 seconds hold  - Wrist Extension AROM  - 1-2 x daily - 7 x weekly - 1-2 sets - 10 reps  - Wrist AROM Radial Ulnar Deviation (Mirrored)  - 1-2 x daily - 7 x weekly - 1-2 sets - 10 reps  - Seated Forearm Pronation and Supination AROM  - 1-2 x daily - 7 x weekly - 1-2 sets - 10 reps  - Isometric Wrist Extension Pronated  - 1 x daily - 7 x weekly - 1-2 sets - 10 reps - 5 seconds hold  - Circular Shoulder Pendulum with Table Support (Mirrored)  - 1-2 x daily - 7 x weekly - 2 sets - 10 reps  - Standing Bent Over Single Arm Scapular Row with Table Support (Mirrored)  - 1-2 x daily - 7 x weekly - 2 sets - 10 reps  - Seated Scapular Retraction  - 1-2 x daily - 7 x weekly - 2 sets - 10 rep    Access Code: [email or web address]  URL: [email or web address]  Date: 06/13/2023  Prepared by: Marni Griffon    Exercises  - Seated Isometric Wrist Radial Deviation with Manual Resistance  - 1 x daily - 7 x weekly - 1-2 sets - 10 reps - 5 seconds hold  - Prone Shoulder Extension - Single Arm (Mirrored)  - 1 x daily - 7 x weekly - 1-2 sets - 10 reps      Plan: [x]  Continue per plan of care.   []  Other:      Treatment Charges: Mins Units Time in/out   [x]   Modalities [place]  Ice 8  - 1  -    [x]   Ther Exercise 32 2    [x]   Manual Therapy 5 0    []   Ther Activities      []   Aquatics      []   Neuromuscular      []  Vasocompression      []  Gait Training      []  Dry needling        []  1 or 2 muscles        []  3 or more muscles      []   Other      Total Treatment time 45 3      Time In: ex: 1:31         Time Out: 2:33    Electronically signed by:  Marni Griffon, PT

## 2023-06-27 ENCOUNTER — Inpatient Hospital Stay: Admit: 2023-06-27 | Discharge: 2023-06-27 | Payer: PRIVATE HEALTH INSURANCE | Primary: Family Medicine

## 2023-06-27 NOTE — Other (Signed)
 [x]  [facility]  [address] [place], [place] [address]    Physical Therapy Daily Treatment Note      Date:  06/27/2023  Patient Name:  Catherine Forbes    DOB:  1970-10-22  MRN: [id number]  Physician:Douglas Constance Goltz, MD                               Insurance: [facility] 30 visits  Medical Diagnosis: left tennis elbow; left shoulder tendonitis                     Rehab Codes: M25.521, M25.512, M25.612, M25.622, R53.1  Onset Date: 03/06/23             Next Dr.'s appt: none scheduled     Visit# / total visits: 8/24  Cancels/No Shows: 0/0    Subjective:    Pain:  []  Yes  [x]  No Location: left elbow/ shoulder  Pain Rating: (0-10 scale) 1/10 with movement.      Pain altered Tx:  []  No  [x]  Yes  Action: tried to keep pain with ex's minimal  Comments: Feeling pretty good today.  Feels less tightness in forearm muscles.        Past Medical History:   Diagnosis Date    Arthritis     Breast cyst August 18    Bronchitis     Carpal tunnel syndrome 03/14/2022    Carpal tunnel syndrome on both sides     Colon polyp     Diabetes mellitus (HCC)     Fatigue     GERD (gastroesophageal reflux disease)     Glucose intolerance     History of blood transfusion 1998    History of diverticulitis     History of vitamin D deficiency     Hyperlipidemia     Hypertension     IBS (irritable bowel syndrome)     Insomnia     Migraine     Obesity     OSA on CPAP     PCOS (polycystic ovarian syndrome)     PONV (postoperative nausea and vomiting)     Thyroid disease     Type 2 diabetes mellitus without complication (HCC)     Under care of service provider 03/01/2022    pcp-[facility]-last visit aug 2023    Under care of service provider 03/01/2022    sleep disorder-dr jacob-[place]-last visit nov 2023    Wears contact lenses      Past Surgical History:   Procedure Laterality Date    BREAST BIOPSY  1992    BREAST CYST EXCISION Right     Age 53    BREAST SURGERY Right     benign cyst removed      CARPAL TUNNEL RELEASE Right 03/14/2022    CARPAL TUNNEL RELEASE performed by Lorne Skeens, MD at [facility] OR    CHOLECYSTECTOMY      COLONOSCOPY N/A 05/29/2019    COLONOSCOPY WITH BIOPSY performed by Tyna Jaksch, MD at [facility] OR    COLONOSCOPY N/A 10/02/2022    COLONOSCOPY DIAGNOSTIC performed by Tery Sanfilippo, MD at [facility] ENDO    FINGER FRACTURE SURGERY  1990    FOOT FRACTURE SURGERY  2012    HERNIA REPAIR      umbilical    HYSTERECTOMY (CERVIX STATUS UNKNOWN)      HYSTERECTOMY, VAGINAL  pt has had 2 miscarriages, and a D+C also    TONSILLECTOMY      UPPER GASTROINTESTINAL ENDOSCOPY      US BREAST BIOPSY W LOC DEVICE 1ST LESION RIGHT Right 01/26/2023    US BREAST BIOPSY W LOC DEVICE 1ST LESION RIGHT 01/26/2023 [facility]       Objective:  Modalities: [place], Ice  Precautions: none  Exercises:  Gripper 20x  Red     Wrist extensor stretch 4x15"       Finkelstein stretch 4x15"       AROM: left Wrist flexion/extension; RD/UD; pro/sup 15x ea.   1# (flex/ext); green roller (others)     Wrist ext/RD isometric 10x5"    added RD isom added 2/19    Bike  2'    added 2/25   Codman's cw/ccw 20x ea.  2#     Bent row 20x  2#     Overhead press 15x 2# Added 3/3   Low row 15x  Green     Seated inf glides  15"x4    added 2/14   Biceps curls palm up/neutral  20x 2#  added 2/21  Added palm neutral 2/25   Supine cane flexion  x20  2# bar  added 2/14   Supine serratus press  x20  2# bar  added 2/14   Supine triceps x20  2# Added 2/25   Sidelying ER  x20  2#  added 2/14    sidelying abd (stand)  x20  2#  added 2/14   Sidelying hor abd x15 2# Added 3/3    Prone shoulder ext  x15  2#  added 2/19          ice to left elbow  x10'       Tendon massage left lateral elbow  x6'    added 2/14   *roller x2 mins   [place] 3 mhz 100% left lateral elbow  x8' 0.8 w/cm2  added 2/14         Ionto left elbow        Other:**patient here for 2 different diagnoses (elbow, shoulder). Gave 60 mins for follow up appointments but may focus on  elbow more if not enough time to treat both fully.     Specific Instructions for next treatment:  Monitor response to today's visit and home program compliance.  Advance program to patient tolerance.  Continue modalities/ STM if this proves helpful      Assessment: [x]  Progressing toward goals.  No adverse effects overall after treatment except some left shoulder/elbow pain with sidelying ext rotation (normally doesn't get that).  Patient did have less pain with tendon massage to left elbow.  Less palpable myofascial restrictions in her forearm extensors as well.      []  No change     []  Other:    [x]  Patient would continue to benefit from skilled physical therapy services in order to:  decrease pain, improve ROM/strength, and return to normal activity including food prep, lifting, recreational activity, bathing, pushing up on hands, housework, sleeping, and opening a jar with minimal pain/difficulty.       STG: (to be met in 12 treatments)  ? Pain: left elbow/shoulder to 0-2/10 to improve ability to do food prep, lifting, recreational activity, bathing, pushing up on hands, housework, sleeping, and opening a jar.    ? ROM: left UE to WNL's to improve ability to do food prep, lifting, recreational activity, bathing, pushing up on hands, housework,  sleeping, and opening a jar.   ? Strength: to improve ability to do food prep, lifting, recreational activity, bathing, pushing up on hands, housework, sleeping, and opening a jar.   ? Function: left UE as evidenced by improved UEFI scores to 70/80  Patient to be independent with home exercise program as demonstrated by performance with correct form without cues.     LTG: (to be met in 24 treatments)  Patient will return to normal activity including food prep, lifting, recreational activity, bathing, pushing up on hands, housework, sleeping, and opening a jar with minimal pain/difficulty.                     Patient goals: "stop the pain"  Pt. Education:  [x]  Yes  []  No   [x]  Reviewed Prior HEP/Ed  Method of Education: [x]  Verbal  [x]  Demo  []  Written  Comprehension of Education:  []  Verbalizes understanding.  []  Demonstrates understanding.  [x]  Needs review.  []  Demonstrates/verbalizes HEP/Ed previously given.     2/14 updated HEP with newly added ex's  Access Code: [email or web address]  URL: [email or web address]  Date: 06/08/2023  Prepared by: Marene Lenz    Exercises  - Standing Wrist Flexion Stretch  - 1-2 x daily - 7 x weekly - 4 sets - 15 seconds hold  - Finkelstein Stretch (Mirrored)  - 1-2 x daily - 7 x weekly - 4 sets - 15 seconds hold  - Wrist Extension AROM  - 1-2 x daily - 7 x weekly - 1-2 sets - 10 reps  - Wrist AROM Radial Ulnar Deviation (Mirrored)  - 1-2 x daily - 7 x weekly - 1-2 sets - 10 reps  - Seated Forearm Pronation and Supination AROM  - 1-2 x daily - 7 x weekly - 1-2 sets - 10 reps  - Isometric Wrist Extension Pronated  - 1 x daily - 7 x weekly - 1-2 sets - 10 reps - 5 seconds hold  - Circular Shoulder Pendulum with Table Support (Mirrored)  - 1-2 x daily - 7 x weekly - 2 sets - 10 reps  - Standing Bent Over Single Arm Scapular Row with Table Support (Mirrored)  - 1-2 x daily - 7 x weekly - 2 sets - 10 reps  - Seated Scapular Retraction  - 1-2 x daily - 7 x weekly - 2 sets - 10 reps  - Seated Shoulder Inferior Glide  - 3 x daily - 7 x weekly - 1 sets - 10 reps - 10" hold  - Supine Shoulder Flexion Extension AAROM with Dowel  - 2 x daily - 7 x weekly - 1 sets - 10 reps  - Supine Scapular Protraction in Flexion with Dumbbells  - 2 x daily - 7 x weekly - 1 sets - 10 reps  - Sidelying Shoulder External Rotation  - 2 x daily - 7 x weekly - 1 sets - 10 reps  - Sidelying Shoulder Abduction Palm Forward  - 2 x daily - 7 x weekly - 1 sets - 10 reps    Access Code: [email or web address]  URL: [email or web address]  Date: 06/06/2023  Prepared by: Marni Griffon     Exercises  - Standing Wrist Flexion Stretch  - 1-2 x daily - 7 x weekly - 4 sets - 15 seconds hold  - Theatre stage manager (Mirrored)  - 1-2 x daily - 7 x weekly - 4 sets - 15 seconds hold  - Wrist Extension AROM  - 1-2 x  daily - 7 x weekly - 1-2 sets - 10 reps  - Wrist AROM Radial Ulnar Deviation (Mirrored)  - 1-2 x daily - 7 x weekly - 1-2 sets - 10 reps  - Seated Forearm Pronation and Supination AROM  - 1-2 x daily - 7 x weekly - 1-2 sets - 10 reps  - Isometric Wrist Extension Pronated  - 1 x daily - 7 x weekly - 1-2 sets - 10 reps - 5 seconds hold  - Circular Shoulder Pendulum with Table Support (Mirrored)  - 1-2 x daily - 7 x weekly - 2 sets - 10 reps  - Standing Bent Over Single Arm Scapular Row with Table Support (Mirrored)  - 1-2 x daily - 7 x weekly - 2 sets - 10 reps  - Seated Scapular Retraction  - 1-2 x daily - 7 x weekly - 2 sets - 10 rep    Access Code: [email or web address]  URL: [email or web address]  Date: 06/13/2023  Prepared by: Marni Griffon    Exercises  - Seated Isometric Wrist Radial Deviation with Manual Resistance  - 1 x daily - 7 x weekly - 1-2 sets - 10 reps - 5 seconds hold  - Prone Shoulder Extension - Single Arm (Mirrored)  - 1 x daily - 7 x weekly - 1-2 sets - 10 reps      Plan: [x]  Continue per plan of care.   []  Other:      Treatment Charges: Mins Units Time in/out   [x]   Modalities [place]  Ice 8  - 1  -    [x]   Ther Exercise 31 2    [x]   Manual Therapy 6 0    []   Ther Activities      []   Aquatics      []   Neuromuscular      []  Vasocompression      []  Gait Training      []  Dry needling        []  1 or 2 muscles        []  3 or more muscles      []   Other      Total Treatment time 45 3      Time In: ex: 1:29         Time Out: 2:29    Electronically signed by:  Marni Griffon, PT

## 2023-06-28 ENCOUNTER — Ambulatory Visit
Admit: 2023-06-28 | Discharge: 2023-06-28 | Payer: PRIVATE HEALTH INSURANCE | Attending: Family Medicine | Primary: Family Medicine

## 2023-06-28 VITALS — BP 130/80 | HR 100 | Resp 18 | Ht 63.0 in | Wt 219.0 lb

## 2023-06-28 DIAGNOSIS — R413 Other amnesia: Secondary | ICD-10-CM

## 2023-06-28 NOTE — Progress Notes (Signed)
 [facility]  [address]   [place], [place] [address]  Phone: [phone]       Name: Catherine Forbes  DOB: 05/05/1970     Chief Complaint:    Catherine Forbes is a 53 y.o. year old female who presents today for   Chief Complaint   Patient presents with    forgetting      Family noticing     other     Dax ok     ADHD     In adults        History of Present Illness:      Subjective   History of Present Illness  The patient presents for evaluation of memory issues.    She reports experiencing episodes of forgetfulness, particularly noticeable during interactions with her children. She also describes instances of losing her train of thought, leading to concerns about potential ADHD. These symptoms have been present for approximately one year. Given her family history of dementia, she expresses anxiety about the possibility of developing the same condition. Her mom was diagnosed pretty young with dementia (she passed away in her 19s)      Medications:    Outpatient Medications Prior to Visit   Medication Sig Dispense Refill    Semaglutide, 1 MG/DOSE, (OZEMPIC, 1 MG/DOSE,) 4 MG/3ML SOPN sc injection Inject 1 mg into the skin every 7 days 9 Adjustable Dose Pre-filled Pen Syringe 1    levothyroxine (SYNTHROID) 75 MCG tablet TAKE 1 TABLET BY MOUTH EVERY DAY 90 tablet 1    SODIUM FLUORIDE 5000 SENSITIVE 1.1-5 % GEL       rosuvastatin (CRESTOR) 10 MG tablet Take 1 tablet by mouth every other day 45 tablet 1    fluocinolone (DERMOTIC) 0.01 % OIL oil APPLY TWO DROPS TWICE A DAY TO EAR BOWLS AS NEEDED FOR FLARES      Clobetasol Propionate 0.05 % SHAM WORK INTO A DAMPENED SCALP, LEAVE ON 5 MIN, WASH OFF. USE 2-3 TIMES WEEKLY AS NEEDED FOR FLARES      metroNIDAZOLE (METROGEL) 1 % gel APPLY ONCE A DAY TO FACE      triamcinolone (KENALOG) 0.1 % ointment Apply 1 Application topically 2 times daily      Solriamfetol HCl 150 MG TABS 150 mg      Zinc 100 MG TABS Take 1 tablet by mouth daily      vitamin D (D3-1000) 25  MCG (1000 UT) CAPS Take 1 capsule by mouth daily      lisinopril (PRINIVIL;ZESTRIL) 10 MG tablet Take 1 tablet by mouth daily 90 tablet 1    spironolactone-hydroCHLOROthiazide (ALDACTAZIDE) 25-25 MG per tablet Take 1 tablet by mouth daily 90 tablet 1     No facility-administered medications prior to visit.       Review of Systems:     Review of Systems     Physical Exam:     Vitals:  BP 130/80 (Site: Left Upper Arm, Position: Sitting, Cuff Size: Large Adult)   Pulse 100   Resp 18   Ht 1.6 m (5\' 3" )   Wt 99.3 kg (219 lb)   SpO2 97%   BMI 38.79 kg/m  Body mass index is 38.79 kg/m.    Physical Exam  Vitals and nursing note reviewed.   Constitutional:       Appearance: Normal appearance.   Cardiovascular:      Rate and Rhythm: Normal rate and regular rhythm.      Heart sounds: Normal  heart sounds.   Pulmonary:      Effort: Pulmonary effort is normal.      Breath sounds: Normal breath sounds.   Neurological:      General: No focal deficit present.      Mental Status: She is alert.   Psychiatric:         Mood and Affect: Mood normal.         Behavior: Behavior normal.         Assessment:      Diagnosis Orders   1. Memory problem                  Plan:        Assessment & Plan  1. Memory issues.  She reports experiencing forgetfulness and difficulty maintaining her train of thought, which has been noticeable over the past year. Given her family history of dementia, there is a concern for potential cognitive impairment. A referral for comprehensive neuropsychological testing will be made to evaluate for possible ADHD or early signs of dementia. She is advised to check her insurance coverage for the testing. Handout of specialists to see was given to patient.          1. Memory problem      MDM: low - chronic condition. Low risk morbidity from planned intervention        No follow-ups on file.    No orders of the defined types were placed in this encounter.    No orders of the defined types were placed in this  encounter.        I reviewed the above assessment and plan with [facility].  Questions were answered and it appears that the [name] has a good understanding of the visit today.    The patient (or guardian, if applicable) and other individuals in attendance with the patient were advised that Artificial Intelligence will be utilized during this visit to record, process the conversation to generate a clinical note and to support improvement of the AI technology. The patient (or guardian, if applicable) and other individuals in attendance at the appointment consented to the use of AI, including the recording.      An electronic signature was used to authenticate this note.    --Calvert Cantor, DO     Electronically signed by Calvert Cantor, DO on 06/28/2023 at 9:14 AM

## 2023-07-02 ENCOUNTER — Inpatient Hospital Stay: Admit: 2023-07-02 | Discharge: 2023-07-02 | Payer: PRIVATE HEALTH INSURANCE | Primary: Family Medicine

## 2023-07-02 NOTE — Other (Signed)
 [x]  [facility]  [address] [place], [place] [address]    Physical Therapy Daily Treatment Note      Date:  07/02/2023  Patient Name:  Catherine Forbes    DOB:  June 18, 1970  MRN: [id number]  Physician:Douglas Constance Goltz, MD                               Insurance: [facility] 30 visits  Medical Diagnosis: left tennis elbow; left shoulder tendonitis                     Rehab Codes: M25.521, M25.512, M25.612, M25.622, R53.1  Onset Date: 03/06/23             Next Dr.'s appt: none scheduled     Visit# / total visits: 9/24  Cancels/No Shows: 0/0    Subjective:    Pain:  []  Yes  [x]  No Location: left elbow/ shoulder  Pain Rating: (0-10 scale) 0/10 with movement.      Pain altered Tx:  []  No  [x]  Yes  Action: tried to keep pain with ex's minimal  Comments: 1/10 at worst in the left shld/elbow past few days.         Past Medical History:   Diagnosis Date    Arthritis     Breast cyst August 18    Bronchitis     Carpal tunnel syndrome 03/14/2022    Carpal tunnel syndrome on both sides     Colon polyp     Diabetes mellitus (HCC)     Fatigue     GERD (gastroesophageal reflux disease)     Glucose intolerance     History of blood transfusion 1998    History of diverticulitis     History of vitamin D deficiency     Hyperlipidemia     Hypertension     IBS (irritable bowel syndrome)     Insomnia     Migraine     Obesity     OSA on CPAP     PCOS (polycystic ovarian syndrome)     PONV (postoperative nausea and vomiting)     Thyroid disease     Type 2 diabetes mellitus without complication (HCC)     Under care of service provider 03/01/2022    pcp-[facility]-last visit aug 2023    Under care of service provider 03/01/2022    sleep disorder-dr jacob[place]-last visit nov 2023    Wears contact lenses      Past Surgical History:   Procedure Laterality Date    BREAST BIOPSY  1992    BREAST CYST EXCISION Right     Age 53    BREAST SURGERY Right     benign cyst removed     CARPAL TUNNEL  RELEASE Right 03/14/2022    CARPAL TUNNEL RELEASE performed by Lorne Skeens, MD at [facility] OR    CHOLECYSTECTOMY      COLONOSCOPY N/A 05/29/2019    COLONOSCOPY WITH BIOPSY performed by Tyna Jaksch, MD at [facility] OR    COLONOSCOPY N/A 10/02/2022    COLONOSCOPY DIAGNOSTIC performed by Tery Sanfilippo, MD at [facility] ENDO    FINGER FRACTURE SURGERY  1990    FOOT FRACTURE SURGERY  2012    HERNIA REPAIR      umbilical    HYSTERECTOMY (CERVIX STATUS UNKNOWN)      HYSTERECTOMY, VAGINAL  pt has had 2 miscarriages, and a D+C also    TONSILLECTOMY      UPPER GASTROINTESTINAL ENDOSCOPY      US BREAST BIOPSY W LOC DEVICE 1ST LESION RIGHT Right 01/26/2023    US BREAST BIOPSY W LOC DEVICE 1ST LESION RIGHT 01/26/2023 [facility]       Objective:  Modalities: [place], Ice  Precautions: none  Exercises:  Gripper 25x  Red     Wrist extensor stretch 4x15"       Finkelstein stretch 4x15"       AROM: left Wrist flexion/extension; RD/UD; pro/sup 15x ea.   1# (flex/ext); green roller (others)     Wrist ext/RD isometric 10x5"    added RD isom added 2/19    Bike  2'    added 2/25   Codman's cw/ccw 20x ea.  2#     Bent row 20x  2#     Overhead press 20x 2# Added 3/3   Low row 15x  Green     Seated inf glides  15"x4    added 2/14   Biceps curls palm up/neutral  20x 2#  added 2/21  Added palm neutral 2/25   Supine cane flexion  x20  2# bar  added 2/14   Supine serratus press  x20  2# bar  added 2/14   Supine triceps x20  2# Added 2/25   Sidelying ER (stand)  x20  Green  added 2/14    sidelying abd (stand)  x20  2#  added 2/14   prone hor abd x15 0# Added 3/3    Prone shoulder ext  x15  2#  added 2/19          ice to left elbow  x10'       Tendon massage left lateral elbow  x6'    added 2/14   *roller x2 mins   [place] 3 mhz 100% left lateral elbow  x8' 0.8 w/cm2  added 2/14         Ionto left elbow        Other:**patient here for 2 different diagnoses (elbow, shoulder). Gave 60 mins for follow up appointments but may focus on elbow  more if not enough time to treat both fully.     Specific Instructions for next treatment:  Monitor response to today's visit and home program compliance.  Advance program to patient tolerance.  Continue modalities/ STM if this proves helpful      Assessment: [x]  Progressing toward goals.  No adverse effects overall after treatment except some left elbow discomfort with resisted radial deviation.  Changed shld ER to t-band standing and hor abd to prone which was more challenging (no wgt on that).  Overall pain levels continue to be low for the most part.      []  No change     []  Other:    [x]  Patient would continue to benefit from skilled physical therapy services in order to:  decrease pain, improve ROM/strength, and return to normal activity including food prep, lifting, recreational activity, bathing, pushing up on hands, housework, sleeping, and opening a jar with minimal pain/difficulty.       STG: (to be met in 12 treatments)  ? Pain: left elbow/shoulder to 0-2/10 to improve ability to do food prep, lifting, recreational activity, bathing, pushing up on hands, housework, sleeping, and opening a jar.    ? ROM: left UE to WNL's to improve ability to do food prep, lifting, recreational activity,  bathing, pushing up on hands, housework, sleeping, and opening a jar.   ? Strength: to improve ability to do food prep, lifting, recreational activity, bathing, pushing up on hands, housework, sleeping, and opening a jar.   ? Function: left UE as evidenced by improved UEFI scores to 70/80  Patient to be independent with home exercise program as demonstrated by performance with correct form without cues.     LTG: (to be met in 24 treatments)  Patient will return to normal activity including food prep, lifting, recreational activity, bathing, pushing up on hands, housework, sleeping, and opening a jar with minimal pain/difficulty.                     Patient goals: "stop the pain"  Pt. Education:  [x]  Yes  []  No  [x]   Reviewed Prior HEP/Ed  Method of Education: [x]  Verbal  [x]  Demo  []  Written  Comprehension of Education:  []  Verbalizes understanding.  []  Demonstrates understanding.  [x]  Needs review.  []  Demonstrates/verbalizes HEP/Ed previously given.     2/14 updated HEP with newly added ex's  Access Code: [email or web address]  URL: [email or web address]  Date: 06/08/2023  Prepared by: Marene Lenz    Exercises  - Standing Wrist Flexion Stretch  - 1-2 x daily - 7 x weekly - 4 sets - 15 seconds hold  - Finkelstein Stretch (Mirrored)  - 1-2 x daily - 7 x weekly - 4 sets - 15 seconds hold  - Wrist Extension AROM  - 1-2 x daily - 7 x weekly - 1-2 sets - 10 reps  - Wrist AROM Radial Ulnar Deviation (Mirrored)  - 1-2 x daily - 7 x weekly - 1-2 sets - 10 reps  - Seated Forearm Pronation and Supination AROM  - 1-2 x daily - 7 x weekly - 1-2 sets - 10 reps  - Isometric Wrist Extension Pronated  - 1 x daily - 7 x weekly - 1-2 sets - 10 reps - 5 seconds hold  - Circular Shoulder Pendulum with Table Support (Mirrored)  - 1-2 x daily - 7 x weekly - 2 sets - 10 reps  - Standing Bent Over Single Arm Scapular Row with Table Support (Mirrored)  - 1-2 x daily - 7 x weekly - 2 sets - 10 reps  - Seated Scapular Retraction  - 1-2 x daily - 7 x weekly - 2 sets - 10 reps  - Seated Shoulder Inferior Glide  - 3 x daily - 7 x weekly - 1 sets - 10 reps - 10" hold  - Supine Shoulder Flexion Extension AAROM with Dowel  - 2 x daily - 7 x weekly - 1 sets - 10 reps  - Supine Scapular Protraction in Flexion with Dumbbells  - 2 x daily - 7 x weekly - 1 sets - 10 reps  - Sidelying Shoulder External Rotation  - 2 x daily - 7 x weekly - 1 sets - 10 reps  - Sidelying Shoulder Abduction Palm Forward  - 2 x daily - 7 x weekly - 1 sets - 10 reps    Access Code: [email or web address]  URL: [email or web address]  Date: 06/06/2023  Prepared by: Marni Griffon     Exercises  - Standing Wrist Flexion Stretch  - 1-2 x daily - 7 x weekly - 4 sets - 15 seconds hold  - Finkelstein  Stretch (Mirrored)  - 1-2 x daily - 7 x weekly - 4 sets - 15 seconds hold  - Wrist  Extension AROM  - 1-2 x daily - 7 x weekly - 1-2 sets - 10 reps  - Wrist AROM Radial Ulnar Deviation (Mirrored)  - 1-2 x daily - 7 x weekly - 1-2 sets - 10 reps  - Seated Forearm Pronation and Supination AROM  - 1-2 x daily - 7 x weekly - 1-2 sets - 10 reps  - Isometric Wrist Extension Pronated  - 1 x daily - 7 x weekly - 1-2 sets - 10 reps - 5 seconds hold  - Circular Shoulder Pendulum with Table Support (Mirrored)  - 1-2 x daily - 7 x weekly - 2 sets - 10 reps  - Standing Bent Over Single Arm Scapular Row with Table Support (Mirrored)  - 1-2 x daily - 7 x weekly - 2 sets - 10 reps  - Seated Scapular Retraction  - 1-2 x daily - 7 x weekly - 2 sets - 10 rep    Access Code: [email or web address]  URL: [email or web address]  Date: 06/13/2023  Prepared by: Marni Griffon    Exercises  - Seated Isometric Wrist Radial Deviation with Manual Resistance  - 1 x daily - 7 x weekly - 1-2 sets - 10 reps - 5 seconds hold  - Prone Shoulder Extension - Single Arm (Mirrored)  - 1 x daily - 7 x weekly - 1-2 sets - 10 reps      Plan: [x]  Continue per plan of care.   []  Other:      Treatment Charges: Mins Units Time in/out   [x]   Modalities [place]  Ice 8  - 1  -    [x]   Ther Exercise 35 2    [x]   Manual Therapy 6 0    []   Ther Activities      []   Aquatics      []   Neuromuscular      []  Vasocompression      []  Gait Training      []  Dry needling        []  1 or 2 muscles        []  3 or more muscles      []   Other      Total Treatment time 49 3      Time In: ex: 11:01         Time Out: 12:02    Electronically signed by:  Marni Griffon, PT

## 2023-07-05 ENCOUNTER — Inpatient Hospital Stay: Admit: 2023-07-05 | Discharge: 2023-07-05 | Payer: PRIVATE HEALTH INSURANCE | Primary: Family Medicine

## 2023-07-05 NOTE — Other (Signed)
 [x]  [facility] - [facility] Therapy  [address] [place], [place] [address]    Physical Therapy Daily Treatment Note      Date:  07/05/2023  Patient Name:  Catherine Forbes    DOB:  08/27/1970  MRN: [id number]  Physician:Douglas Constance Goltz, MD                               Insurance: [facility] 30 visits  Medical Diagnosis: left tennis elbow; left shoulder tendonitis                     Rehab Codes: M25.521, M25.512, M25.612, M25.622, R53.1  Onset Date: 03/06/23             Next Dr.'s appt: none scheduled     Visit# / total visits: 10/24  Cancels/No Shows: 0/0    Subjective:    Pain:  []  Yes  [x]  No Location: left elbow/ shoulder  Pain Rating: (0-10 scale) 0/10 with movement.      Pain altered Tx:  []  No  [x]  Yes  Action: tried to keep pain with ex's minimal  Comments: 1/10 at worst in the left shld/elbow past few days.         Past Medical History:   Diagnosis Date    Arthritis     Breast cyst August 18    Bronchitis     Carpal tunnel syndrome 03/14/2022    Carpal tunnel syndrome on both sides     Colon polyp     Diabetes mellitus (HCC)     Fatigue     GERD (gastroesophageal reflux disease)     Glucose intolerance     History of blood transfusion 1998    History of diverticulitis     History of vitamin D deficiency     Hyperlipidemia     Hypertension     IBS (irritable bowel syndrome)     Insomnia     Migraine     Obesity     OSA on CPAP     PCOS (polycystic ovarian syndrome)     PONV (postoperative nausea and vomiting)     Thyroid disease     Type 2 diabetes mellitus without complication (HCC)     Under care of service provider 03/01/2022    pcp-sw[facility]z-[place]-last visit aug 2023    Under care of service provider 03/01/2022    sleep disorder-dr jacob-[place]-last visit nov 2023    Wears contact lenses      Past Surgical History:   Procedure Laterality Date    BREAST BIOPSY  1992    BREAST CYST EXCISION Right     Age 53    BREAST SURGERY Right     benign cyst removed     CARPAL TUNNEL  RELEASE Right 03/14/2022    CARPAL TUNNEL RELEASE performed by Lorne Skeens, MD at [facility] OR    CHOLECYSTECTOMY      COLONOSCOPY N/A 05/29/2019    COLONOSCOPY WITH BIOPSY performed by Tyna Jaksch, MD at [facility] OR    COLONOSCOPY N/A 10/02/2022    COLONOSCOPY DIAGNOSTIC performed by Tery Sanfilippo, MD at [facility] ENDO    FINGER FRACTURE SURGERY  1990    FOOT FRACTURE SURGERY  2012    HERNIA REPAIR      umbilical    HYSTERECTOMY (CERVIX STATUS UNKNOWN)      HYSTERECTOMY, VAGINAL  pt has had 2 miscarriages, and a D+C also    TONSILLECTOMY      UPPER GASTROINTESTINAL ENDOSCOPY      US BREAST BIOPSY W LOC DEVICE 1ST LESION RIGHT Right 01/26/2023    US BREAST BIOPSY W LOC DEVICE 1ST LESION RIGHT 01/26/2023 [facility]       Objective:  Modalities: [place], Ice  Precautions: none  Exercises:  Gripper 25x  Red     Wrist extensor stretch 4x15"       Finkelstein stretch 4x15"       AROM: left Wrist flexion/extension; RD/UD; pro/sup 15x ea.   1# (flex/ext); green roller (others)     Wrist ext/RD isometric 10x5"    added RD isom added 2/19    Bike  2'    added 2/25   Codman's cw/ccw 20x ea.  2#     Bent row 20x  2#     Overhead press 20x 2# Added 3/3   Low row 15x  Green     Seated inf glides  15"x4    added 2/14   Biceps curls palm up/neutral  20x 2#  added 2/21  Added palm neutral 2/25   Supine cane flexion  x20  2# bar  added 2/14   Supine serratus press  x20  2# bar  added 2/14   Supine triceps x20  2# Added 2/25   Sidelying ER (stand)  x20  Green  added 2/14    sidelying abd (stand)  x20  2#  added 2/14   prone hor abd x15 0# Added 3/3    Prone shoulder ext  x15  2#  added 2/19          ice to left elbow  x10'       Tendon massage left lateral elbow  x6'    added 2/14   *roller x2 mins   [place] 3 mhz 100% left lateral elbow  x8' 0.8 w/cm2  added 2/14         Ionto left elbow        Other:**patient here for 2 different diagnoses (elbow, shoulder). Gave 60 mins for follow up appointments but may focus on elbow  more if not enough time to treat both fully.     Specific Instructions for next treatment:  Monitor response to today's visit and home program compliance.  Advance program to patient tolerance.  Continue modalities/ STM if this proves helpful      Assessment: [x]  Progressing toward goals. No changes to ex program as changes made recently.  No adverse effects overall after treatment except some left elbow discomfort with resisted radial deviation.  Overall pain levels continue to be low for the most part.      []  No change     []  Other:    [x]  Patient would continue to benefit from skilled physical therapy services in order to:  decrease pain, improve ROM/strength, and return to normal activity including food prep, lifting, recreational activity, bathing, pushing up on hands, housework, sleeping, and opening a jar with minimal pain/difficulty.       STG: (to be met in 12 treatments)  ? Pain: left elbow/shoulder to 0-2/10 to improve ability to do food prep, lifting, recreational activity, bathing, pushing up on hands, housework, sleeping, and opening a jar.    ? ROM: left UE to WNL's to improve ability to do food prep, lifting, recreational activity, bathing, pushing up on hands, housework, sleeping, and opening a jar.   ?  Strength: to improve ability to do food prep, lifting, recreational activity, bathing, pushing up on hands, housework, sleeping, and opening a jar.   ? Function: left UE as evidenced by improved UEFI scores to 70/80  Patient to be independent with home exercise program as demonstrated by performance with correct form without cues.     LTG: (to be met in 24 treatments)  Patient will return to normal activity including food prep, lifting, recreational activity, bathing, pushing up on hands, housework, sleeping, and opening a jar with minimal pain/difficulty.                     Patient goals: "stop the pain"  Pt. Education:  [x]  Yes  []  No  [x]  Reviewed Prior HEP/Ed  Method of Education: [x]  Verbal   [x]  Demo  []  Written  Comprehension of Education:  []  Verbalizes understanding.  []  Demonstrates understanding.  [x]  Needs review.  []  Demonstrates/verbalizes HEP/Ed previously given.     2/14 updated HEP with newly added ex's  Access Code: [email or web address]  URL: [email or web address]  Date: 06/08/2023  Prepared by: Marene Lenz    Exercises  - Standing Wrist Flexion Stretch  - 1-2 x daily - 7 x weekly - 4 sets - 15 seconds hold  - Finkelstein Stretch (Mirrored)  - 1-2 x daily - 7 x weekly - 4 sets - 15 seconds hold  - Wrist Extension AROM  - 1-2 x daily - 7 x weekly - 1-2 sets - 10 reps  - Wrist AROM Radial Ulnar Deviation (Mirrored)  - 1-2 x daily - 7 x weekly - 1-2 sets - 10 reps  - Seated Forearm Pronation and Supination AROM  - 1-2 x daily - 7 x weekly - 1-2 sets - 10 reps  - Isometric Wrist Extension Pronated  - 1 x daily - 7 x weekly - 1-2 sets - 10 reps - 5 seconds hold  - Circular Shoulder Pendulum with Table Support (Mirrored)  - 1-2 x daily - 7 x weekly - 2 sets - 10 reps  - Standing Bent Over Single Arm Scapular Row with Table Support (Mirrored)  - 1-2 x daily - 7 x weekly - 2 sets - 10 reps  - Seated Scapular Retraction  - 1-2 x daily - 7 x weekly - 2 sets - 10 reps  - Seated Shoulder Inferior Glide  - 3 x daily - 7 x weekly - 1 sets - 10 reps - 10" hold  - Supine Shoulder Flexion Extension AAROM with Dowel  - 2 x daily - 7 x weekly - 1 sets - 10 reps  - Supine Scapular Protraction in Flexion with Dumbbells  - 2 x daily - 7 x weekly - 1 sets - 10 reps  - Sidelying Shoulder External Rotation  - 2 x daily - 7 x weekly - 1 sets - 10 reps  - Sidelying Shoulder Abduction Palm Forward  - 2 x daily - 7 x weekly - 1 sets - 10 reps    Access Code: [email or web address]  URL: [email or web address]  Date: 06/06/2023  Prepared by: Marni Griffon     Exercises  - Standing Wrist Flexion Stretch  - 1-2 x daily - 7 x weekly - 4 sets - 15 seconds hold  - Radiation protection practitioner (Mirrored)  - 1-2 x daily - 7 x weekly - 4 sets - 15  seconds hold  - Wrist Extension AROM  - 1-2 x daily - 7 x weekly - 1-2 sets -  10 reps  - Wrist AROM Radial Ulnar Deviation (Mirrored)  - 1-2 x daily - 7 x weekly - 1-2 sets - 10 reps  - Seated Forearm Pronation and Supination AROM  - 1-2 x daily - 7 x weekly - 1-2 sets - 10 reps  - Isometric Wrist Extension Pronated  - 1 x daily - 7 x weekly - 1-2 sets - 10 reps - 5 seconds hold  - Circular Shoulder Pendulum with Table Support (Mirrored)  - 1-2 x daily - 7 x weekly - 2 sets - 10 reps  - Standing Bent Over Single Arm Scapular Row with Table Support (Mirrored)  - 1-2 x daily - 7 x weekly - 2 sets - 10 reps  - Seated Scapular Retraction  - 1-2 x daily - 7 x weekly - 2 sets - 10 rep    Access Code: [email or web address]  URL: [email or web address]  Date: 06/13/2023  Prepared by: Marni Griffon    Exercises  - Seated Isometric Wrist Radial Deviation with Manual Resistance  - 1 x daily - 7 x weekly - 1-2 sets - 10 reps - 5 seconds hold  - Prone Shoulder Extension - Single Arm (Mirrored)  - 1 x daily - 7 x weekly - 1-2 sets - 10 reps      Plan: [x]  Continue per plan of care.   []  Other:      Treatment Charges: Mins Units Time in/out   [x]   Modalities [place]  Ice 8  10 1   -    [x]   Ther Exercise 36 2    [x]   Manual Therapy 6 0    []   Ther Activities      []   Aquatics      []   Neuromuscular      []  Vasocompression      []  Gait Training      []  Dry needling        []  1 or 2 muscles        []  3 or more muscles      []   Other      Total Treatment time 50 3      Time In: 11:05 am          Time Out: 12:05 pm    Electronically signed by:  Marene Lenz, PTA

## 2023-07-10 ENCOUNTER — Inpatient Hospital Stay: Admit: 2023-07-10 | Discharge: 2023-07-10 | Payer: PRIVATE HEALTH INSURANCE | Primary: Family Medicine

## 2023-07-10 NOTE — Other (Signed)
 [x]  [facility] - [facility] Therapy  [address] [place], [place] [address]    Physical Therapy Daily Treatment Note      Date:  07/10/2023  Patient Name:  Catherine Forbes    DOB:  March 12, 1971  MRN: [id number]  Physician:Douglas Constance Goltz, MD                               Insurance: [facility] 30 visits  Medical Diagnosis: left tennis elbow; left shoulder tendonitis                     Rehab Codes: M25.521, M25.512, M25.612, M25.622, R53.1  Onset Date: 03/06/23             Next Dr.'s appt: none scheduled     Visit# / total visits: 11/24  Cancels/No Shows: 0/0    Subjective:    Pain:  []  Yes  [x]  No Location: left elbow/ shoulder  Pain Rating: (0-10 scale) 1/10 with movement.      Pain altered Tx:  []  No  [x]  Yes  Action: tried to keep pain with ex's minimal  Comments: 2/10 at worst in the left shld/elbow past few days.   Pain gets that high if picking up something wrong.        Past Medical History:   Diagnosis Date    Arthritis     Breast cyst August 18    Bronchitis     Carpal tunnel syndrome 03/14/2022    Carpal tunnel syndrome on both sides     Colon polyp     Diabetes mellitus (HCC)     Fatigue     GERD (gastroesophageal reflux disease)     Glucose intolerance     History of blood transfusion 1998    History of diverticulitis     History of vitamin D deficiency     Hyperlipidemia     Hypertension     IBS (irritable bowel syndrome)     Insomnia     Migraine     Obesity     OSA on CPAP     PCOS (polycystic ovarian syndrome)     PONV (postoperative nausea and vomiting)     Thyroid disease     Type 2 diabetes mellitus without complication (HCC)     Under care of service provider 03/01/2022    pcp-[facility]-[place]-last visit aug 2023    Under care of service provider 03/01/2022    sleep disorder-dr jacob[place]-last visit nov 2023    Wears contact lenses      Past Surgical History:   Procedure Laterality Date    BREAST BIOPSY  1992    BREAST CYST EXCISION Right     Age 53    BREAST  SURGERY Right     benign cyst removed     CARPAL TUNNEL RELEASE Right 03/14/2022    CARPAL TUNNEL RELEASE performed by Lorne Skeens, MD at [facility] OR    CHOLECYSTECTOMY      COLONOSCOPY N/A 05/29/2019    COLONOSCOPY WITH BIOPSY performed by Tyna Jaksch, MD at [facility] OR    COLONOSCOPY N/A 10/02/2022    COLONOSCOPY DIAGNOSTIC performed by Tery Sanfilippo, MD at [facility] ENDO    FINGER FRACTURE SURGERY  1990    FOOT FRACTURE SURGERY  2012    HERNIA REPAIR      umbilical    HYSTERECTOMY (CERVIX STATUS UNKNOWN)  HYSTERECTOMY, VAGINAL      pt has had 2 miscarriages, and a D+C also    TONSILLECTOMY      UPPER GASTROINTESTINAL ENDOSCOPY      US BREAST BIOPSY W LOC DEVICE 1ST LESION RIGHT Right 01/26/2023    US BREAST BIOPSY W LOC DEVICE 1ST LESION RIGHT 01/26/2023 [facility]       Objective:  Modalities: [place], Ice  Precautions: none  Exercises:  Gripper 25x  Red     Wrist extensor stretch 4x15"       Finkelstein stretch 4x15"       AROM: left Wrist flexion/extension; RD/UD; pro/sup 15x ea.   2# (flex/ext); green roller (others)     Wrist ext/RD isometric 10x5" NP    added RD isom added 2/19    Bike  2'    added 2/25   Codman's cw/ccw 20x ea.  2#     Bent row 20x  2#     Overhead press 20x 2# Added 3/3   Low row 15x  Green     Seated inf glides  15"x4    added 2/14   Biceps curls palm up/neutral  20x 2#  added 2/21  Added palm neutral 2/25   Supine cane flexion  x20  2# bar  added 2/14   Supine serratus press  x20  2# bar  added 2/14   Supine triceps x20  2# Added 2/25   Sidelying ER (stand)  x20  Green  added 2/14    standing shld flex/ abd   x15/20  2#  added 2/14   prone hor abd x15 0# Added 3/3    Prone shoulder ext  x15  2#  added 2/19          ice to left elbow  x10'       Tendon massage left lateral elbow  x6'    added 2/14   *roller x2 mins   [place] 3 mhz 100% left lateral elbow  x8' 0.8 w/cm2  added 2/14         Ionto left elbow        Other:**patient here for 2 different diagnoses (elbow, shoulder).  Gave 60 mins for follow up appointments but may focus on elbow more if not enough time to treat both fully.     Specific Instructions for next treatment:  Monitor response to today's visit and home program compliance.  Advance program to patient tolerance.  Continue modalities/ STM if this proves helpful.  PN next visit-may consider discharge if doing well.         Assessment: [x]  Progressing toward goals. No significant changes to ex program overall but increased wrist flex/ext to 2 lbs and changed supine shld flexion to standing with 2 lb-did well with each.  No adverse effects with any other exercise.  Still some tenderness with massage to left elbow.     []  No change     []  Other:    [x]  Patient would continue to benefit from skilled physical therapy services in order to:  decrease pain, improve ROM/strength, and return to normal activity including food prep, lifting, recreational activity, bathing, pushing up on hands, housework, sleeping, and opening a jar with minimal pain/difficulty.       STG: (to be met in 12 treatments)  ? Pain: left elbow/shoulder to 0-2/10 to improve ability to do food prep, lifting, recreational activity, bathing, pushing up on hands, housework, sleeping, and opening a jar.    ?  ROM: left UE to WNL's to improve ability to do food prep, lifting, recreational activity, bathing, pushing up on hands, housework, sleeping, and opening a jar.   ? Strength: to improve ability to do food prep, lifting, recreational activity, bathing, pushing up on hands, housework, sleeping, and opening a jar.   ? Function: left UE as evidenced by improved UEFI scores to 70/80  Patient to be independent with home exercise program as demonstrated by performance with correct form without cues.     LTG: (to be met in 24 treatments)  Patient will return to normal activity including food prep, lifting, recreational activity, bathing, pushing up on hands, housework, sleeping, and opening a jar with minimal  pain/difficulty.                     Patient goals: "stop the pain"  Pt. Education:  [x]  Yes  []  No  [x]  Reviewed Prior HEP/Ed  Method of Education: [x]  Verbal  [x]  Demo  []  Written  Comprehension of Education:  []  Verbalizes understanding.  []  Demonstrates understanding.  [x]  Needs review.  []  Demonstrates/verbalizes HEP/Ed previously given.     2/14 updated HEP with newly added ex's  Access Code: [email or web address]  URL: [email or web address]  Date: 06/08/2023  Prepared by: Marene Lenz    Exercises  - Standing Wrist Flexion Stretch  - 1-2 x daily - 7 x weekly - 4 sets - 15 seconds hold  - Finkelstein Stretch (Mirrored)  - 1-2 x daily - 7 x weekly - 4 sets - 15 seconds hold  - Wrist Extension AROM  - 1-2 x daily - 7 x weekly - 1-2 sets - 10 reps  - Wrist AROM Radial Ulnar Deviation (Mirrored)  - 1-2 x daily - 7 x weekly - 1-2 sets - 10 reps  - Seated Forearm Pronation and Supination AROM  - 1-2 x daily - 7 x weekly - 1-2 sets - 10 reps  - Isometric Wrist Extension Pronated  - 1 x daily - 7 x weekly - 1-2 sets - 10 reps - 5 seconds hold  - Circular Shoulder Pendulum with Table Support (Mirrored)  - 1-2 x daily - 7 x weekly - 2 sets - 10 reps  - Standing Bent Over Single Arm Scapular Row with Table Support (Mirrored)  - 1-2 x daily - 7 x weekly - 2 sets - 10 reps  - Seated Scapular Retraction  - 1-2 x daily - 7 x weekly - 2 sets - 10 reps  - Seated Shoulder Inferior Glide  - 3 x daily - 7 x weekly - 1 sets - 10 reps - 10" hold  - Supine Shoulder Flexion Extension AAROM with Dowel  - 2 x daily - 7 x weekly - 1 sets - 10 reps  - Supine Scapular Protraction in Flexion with Dumbbells  - 2 x daily - 7 x weekly - 1 sets - 10 reps  - Sidelying Shoulder External Rotation  - 2 x daily - 7 x weekly - 1 sets - 10 reps  - Sidelying Shoulder Abduction Palm Forward  - 2 x daily - 7 x weekly - 1 sets - 10 reps    Access Code: [email or web address]  URL: [email or web address]  Date: 06/06/2023  Prepared by: Marni Griffon     Exercises  -  Standing Wrist Flexion Stretch  - 1-2 x daily - 7 x weekly - 4 sets - 15 seconds hold  - Radiation protection practitioner (Mirrored)  - 1-2 x  daily - 7 x weekly - 4 sets - 15 seconds hold  - Wrist Extension AROM  - 1-2 x daily - 7 x weekly - 1-2 sets - 10 reps  - Wrist AROM Radial Ulnar Deviation (Mirrored)  - 1-2 x daily - 7 x weekly - 1-2 sets - 10 reps  - Seated Forearm Pronation and Supination AROM  - 1-2 x daily - 7 x weekly - 1-2 sets - 10 reps  - Isometric Wrist Extension Pronated  - 1 x daily - 7 x weekly - 1-2 sets - 10 reps - 5 seconds hold  - Circular Shoulder Pendulum with Table Support (Mirrored)  - 1-2 x daily - 7 x weekly - 2 sets - 10 reps  - Standing Bent Over Single Arm Scapular Row with Table Support (Mirrored)  - 1-2 x daily - 7 x weekly - 2 sets - 10 reps  - Seated Scapular Retraction  - 1-2 x daily - 7 x weekly - 2 sets - 10 rep    Access Code: [email or web address]  URL: [email or web address]  Date: 06/13/2023  Prepared by: Marni Griffon    Exercises  - Seated Isometric Wrist Radial Deviation with Manual Resistance  - 1 x daily - 7 x weekly - 1-2 sets - 10 reps - 5 seconds hold  - Prone Shoulder Extension - Single Arm (Mirrored)  - 1 x daily - 7 x weekly - 1-2 sets - 10 reps      Plan: [x]  Continue per plan of care.   []  Other:      Treatment Charges: Mins Units Time in/out   [x]   Modalities [place]  Ice 8  10 1   -    [x]   Ther Exercise 24 1    [x]   Manual Therapy 6 1    []   Ther Activities      []   Aquatics      []   Neuromuscular      []  Vasocompression      []  Gait Training      []  Dry needling        []  1 or 2 muscles        []  3 or more muscles      []   Other      Total Treatment time 38 3      Time In: 1:30 am         Time Out: 2:22 pm    Electronically signed by:  Marni Griffon, PT

## 2023-07-13 ENCOUNTER — Inpatient Hospital Stay: Admit: 2023-07-13 | Discharge: 2023-07-13 | Payer: PRIVATE HEALTH INSURANCE | Primary: Family Medicine

## 2023-07-13 NOTE — Progress Notes (Signed)
 [x]  [facility]  [address] [place], [place] [address]    Physical Therapy Daily Treatment Note/progress note      Date:  07/13/2023  Patient Name:  Catherine Forbes    DOB:  April 29, 1970  MRN: [id number]  Physician:Douglas Constance Goltz, MD                               Insurance: [facility] 30 visits  Medical Diagnosis: left tennis elbow; left shoulder tendonitis                     Rehab Codes: M25.521, M25.512, M25.612, M25.622, R53.1  Onset Date: 03/06/23             Next Dr.'s appt: none scheduled   Visit# / total visits: 12/24  Cancels/No Shows: 0/0    Subjective:    Pain:  []  Yes  [x]  No Location: left elbow/ shoulder  Pain Rating: (0-10 scale) 0/10 with movement.      Pain altered Tx:  []  No  [x]  Yes  Action: tried to keep pain with ex's minimal  Comments: 2/10 at worst in the left shld/elbow past few days.  A little bit of difficulty with recreational activities, housework, pushing up and carrying heavy objects.     Past Medical History:   Diagnosis Date    Arthritis     Breast cyst August 18    Bronchitis     Carpal tunnel syndrome 03/14/2022    Carpal tunnel syndrome on both sides     Colon polyp     Diabetes mellitus (HCC)     Fatigue     GERD (gastroesophageal reflux disease)     Glucose intolerance     History of blood transfusion 1998    History of diverticulitis     History of vitamin D deficiency     Hyperlipidemia     Hypertension     IBS (irritable bowel syndrome)     Insomnia     Migraine     Obesity     OSA on CPAP     PCOS (polycystic ovarian syndrome)     PONV (postoperative nausea and vomiting)     Thyroid disease     Type 2 diabetes mellitus without complication (HCC)     Under care of service provider 03/01/2022    pcp-[facility]-last visit aug 2023    Under care of service provider 03/01/2022    sleep disorder-dr jacob-[place]-last visit nov 2023    Wears contact lenses      Past Surgical History:   Procedure Laterality Date    BREAST  BIOPSY  1992    BREAST CYST EXCISION Right     Age 53    BREAST SURGERY Right     benign cyst removed     CARPAL TUNNEL RELEASE Right 03/14/2022    CARPAL TUNNEL RELEASE performed by Lorne Skeens, MD at [facility] OR    CHOLECYSTECTOMY      COLONOSCOPY N/A 05/29/2019    COLONOSCOPY WITH BIOPSY performed by Tyna Jaksch, MD at [facility] OR    COLONOSCOPY N/A 10/02/2022    COLONOSCOPY DIAGNOSTIC performed by Tery Sanfilippo, MD at [facility] ENDO    FINGER FRACTURE SURGERY  1990    FOOT FRACTURE SURGERY  2012    HERNIA REPAIR      umbilical    HYSTERECTOMY (CERVIX STATUS  UNKNOWN)      HYSTERECTOMY, VAGINAL      pt has had 2 miscarriages, and a D+C also    TONSILLECTOMY      UPPER GASTROINTESTINAL ENDOSCOPY      US BREAST BIOPSY W LOC DEVICE 1ST LESION RIGHT Right 01/26/2023    US BREAST BIOPSY W LOC DEVICE 1ST LESION RIGHT 01/26/2023 [facility]       Objective:    Progress note on 07/13/23:                   ROM  A/P END FEEL STRENGTH TESTS (+/-) Left Right Not Tested     Left Right   Left Right Drop Arm     []     Sit Shld Flex           Sulcus Sign     []     Sit Shld Abd           Apprehension     []     Sit Shld IR           Yergasons     []     Shoulder Flex 145 142   4+   Speeds +mild (elbow)   []     Ext           Neer -   []     ABD 150 138   4+   Hawkins  - (shoulder)   []     ER @ 90 90 108   4 (elbow pain)   Painful Arc     []     IR T9 T9   4+   Tinel     []     Supraspinatus           Tennis elbow +       Infraspinatus                     Serratus Ant                     Pectoralis                     Lats                     Mid Trap                     Lower Trap                     Elbow Flex. 142 143   5             Elbow Ext. -2 0   5             Pronation 75 67   4+             Supination 85 83   4+             Wrist Flex. 65 75   5             Wrist Ext. 80 85   5             Radial Deviation                     Ulnar Deviation                     Grip 40 lbs (was  20 lbs) 59 lbs                        OBSERVATION No Deficit Deficit Not Tested Comments   Forward Head []   []   []       Rounded Shoulders []   []   []       Kyphosis []   []   []       Scapular Height/Position []   []   []       Winging []   []   []       Scapulohumeral Rhythm []   []   []       INSPECTION/PALPATION           SC/AC Joint []   []   []       Supraspinatus []   []   []       Biceps tendon/groove [x]   []   []       Posterior shld []   []   []       Lateral elbow/biceps tendon []   [x]   []       NEUROLOGICAL           Cervical ROM/Quadrant []   []   []       Reflexes []   []   []       Compression/Distraction []   []   []       Sensation []   []   []          Functional Test: UEFI Score: Raw score of 40/80,   which is 50% functionally impaired (initially); 73/80 (currently)      Comments:    Modalities: [place], Ice  Precautions: none  Exercises:  Gripper 25x  Red     Wrist extensor stretch 4x15"       Finkelstein stretch 4x15"       AROM: left Wrist flexion/extension; RD/UD; pro/sup 15x ea.   2# db (flex/ext); 2# bar     Wrist ext/RD isometric 10x5" NP    added RD isom added 2/19    Bike  2'    added 2/25   Codman's cw/ccw 20x ea.  2#     Bent row 20x  2#     Overhead press 20x 2# Added 3/3   Low row 15x  Green     Seated inf glides  15"x4    added 2/14   Biceps curls palm up/neutral  20x 2#  added 2/21  Added palm neutral 2/25   Supine cane flexion  x20  2# bar  added 2/14   Supine serratus press  x20  2# bar  added 2/14   Supine triceps x20  2# Added 2/25   Sidelying ER (stand)  x20  Green  added 2/14    standing shld flex/ abd   x15/20  2#  added 2/14   prone hor abd x15 0# Added 3/3    Prone shoulder ext  x15  2#  added 2/19          ice to left elbow  x10' -       Tendon massage left lateral elbow  x6' (5' today)    added 2/14   *roller x2 mins   [place] 3 mhz 100% left lateral elbow  x8' 0.8 w/cm2  added 2/14         Ionto left elbow        Other:**patient here for 2 different diagnoses (elbow, shoulder). Gave 60 mins for follow up appointments but may focus on elbow more if not  enough time to treat both fully.  Specific Instructions for next treatment:  Monitor response to today's visit and home program compliance.  Advance program to patient tolerance.  Will hold PT for now with tentative discharge if patient does not contact within a few weeks.             Assessment: [x]  Progressing toward goals.  Progress note completed today.  Good improvement in shoulder/elbow ROM/strength.  Some weakness still (see objective).  Improved grip strength but still weak.  Continued tenderness left lateral elbow.  Good improvement in UEFI functional outcome scores.  Patient has made very good progress overall towards goals.  It was decided to hold PT for now and just work on her home program/independent self care.  Patient to contact us if any further issue arise.        []  No change     []  Other:    [x]  Patient would continue to benefit from skilled physical therapy services in order to:  decrease pain, improve ROM/strength, and return to normal activity including food prep, lifting, recreational activity, bathing, pushing up on hands, housework, sleeping, and opening a jar with minimal pain/difficulty.       STG: (to be met in 12 treatments)  ? Pain: left elbow/shoulder to 0-2/10 to improve ability to do food prep, lifting, recreational activity, bathing, pushing up on hands, housework, sleeping, and opening a jar.  07/13/23:  Met  ? ROM: left UE to WNL's to improve ability to do food prep, lifting, recreational activity, bathing, pushing up on hands, housework, sleeping, and opening a jar. 07/13/23:  Nearly Met  ? Strength: to improve ability to do food prep, lifting, recreational activity, bathing, pushing up on hands, housework, sleeping, and opening a jar. 07/13/23:  progressing  ? Function: left UE as evidenced by improved UEFI scores to 70/80 07/13/23:  Met  Patient to be independent with home exercise program as demonstrated by performance with correct form without cues.07/13/23:  nearly Met      LTG: (to be met in 24 treatments)  Patient will return to normal activity including food prep, lifting, recreational activity, bathing, pushing up on hands, housework, sleeping, and opening a jar with minimal pain/difficulty. 07/13/23: Nearly Met                    Patient goals: "stop the pain"  Pt. Education:  [x]  Yes  []  No  [x]  Reviewed Prior HEP/Ed  Method of Education: [x]  Verbal  [x]  Demo  []  Written  Comprehension of Education:  []  Verbalizes understanding.  []  Demonstrates understanding.  [x]  Needs review.  []  Demonstrates/verbalizes HEP/Ed previously given.     2/14 updated HEP with newly added ex's  Access Code: [email or web address]  URL: [email or web address]  Date: 06/08/2023  Prepared by: Marene Lenz    Exercises  - Standing Wrist Flexion Stretch  - 1-2 x daily - 7 x weekly - 4 sets - 15 seconds hold  - Radiation protection practitioner (Mirrored)  - 1-2 x daily - 7 x weekly - 4 sets - 15 seconds hold  - Wrist Extension AROM  - 1-2 x daily - 7 x weekly - 1-2 sets - 10 reps  - Wrist AROM Radial Ulnar Deviation (Mirrored)  - 1-2 x daily - 7 x weekly - 1-2 sets - 10 reps  - Seated Forearm Pronation and Supination AROM  - 1-2 x daily - 7 x weekly - 1-2 sets - 10 reps  - Isometric Wrist Extension Pronated  - 1 x daily -  7 x weekly - 1-2 sets - 10 reps - 5 seconds hold  - Circular Shoulder Pendulum with Table Support (Mirrored)  - 1-2 x daily - 7 x weekly - 2 sets - 10 reps  - Standing Bent Over Single Arm Scapular Row with Table Support (Mirrored)  - 1-2 x daily - 7 x weekly - 2 sets - 10 reps  - Seated Scapular Retraction  - 1-2 x daily - 7 x weekly - 2 sets - 10 reps  - Seated Shoulder Inferior Glide  - 3 x daily - 7 x weekly - 1 sets - 10 reps - 10" hold  - Supine Shoulder Flexion Extension AAROM with Dowel  - 2 x daily - 7 x weekly - 1 sets - 10 reps  - Supine Scapular Protraction in Flexion with Dumbbells  - 2 x daily - 7 x weekly - 1 sets - 10 reps  - Sidelying Shoulder External Rotation  - 2 x daily - 7 x weekly - 1  sets - 10 reps  - Sidelying Shoulder Abduction Palm Forward  - 2 x daily - 7 x weekly - 1 sets - 10 reps    Access Code: [email or web address]  URL: [email or web address]  Date: 06/06/2023  Prepared by: Marni Griffon     Exercises  - Standing Wrist Flexion Stretch  - 1-2 x daily - 7 x weekly - 4 sets - 15 seconds hold  - Radiation protection practitioner (Mirrored)  - 1-2 x daily - 7 x weekly - 4 sets - 15 seconds hold  - Wrist Extension AROM  - 1-2 x daily - 7 x weekly - 1-2 sets - 10 reps  - Wrist AROM Radial Ulnar Deviation (Mirrored)  - 1-2 x daily - 7 x weekly - 1-2 sets - 10 reps  - Seated Forearm Pronation and Supination AROM  - 1-2 x daily - 7 x weekly - 1-2 sets - 10 reps  - Isometric Wrist Extension Pronated  - 1 x daily - 7 x weekly - 1-2 sets - 10 reps - 5 seconds hold  - Circular Shoulder Pendulum with Table Support (Mirrored)  - 1-2 x daily - 7 x weekly - 2 sets - 10 reps  - Standing Bent Over Single Arm Scapular Row with Table Support (Mirrored)  - 1-2 x daily - 7 x weekly - 2 sets - 10 reps  - Seated Scapular Retraction  - 1-2 x daily - 7 x weekly - 2 sets - 10 rep    Access Code: [email or web address]  URL: [email or web address]  Date: 06/13/2023  Prepared by: Marni Griffon    Exercises  - Seated Isometric Wrist Radial Deviation with Manual Resistance  - 1 x daily - 7 x weekly - 1-2 sets - 10 reps - 5 seconds hold  - Prone Shoulder Extension - Single Arm (Mirrored)  - 1 x daily - 7 x weekly - 1-2 sets - 10 reps      Plan: [x]  Continue per plan of care. (Only if needed)   []  Other:      Treatment Charges: Mins Units Time in/out   [x]   Modalities [place]  Ice 8  - 1  -    [x]   Ther Exercise 25 1    [x]   Manual Therapy 5 1    []   Ther Activities      []   Aquatics      []   Neuromuscular      []  Vasocompression      []   Gait Training      []  Dry needling        []  1 or 2 muscles        []  3 or more muscles      []   Other      Total Treatment time 38 3      Time In: 8:46 am         Time Out: 9:30 pm    Electronically signed by:  Marni Griffon, PT

## 2023-07-19 ENCOUNTER — Encounter

## 2023-07-19 MED ORDER — SPIRONOLACTONE-HCTZ 25-25 MG PO TABS
25-25 | ORAL_TABLET | Freq: Every day | ORAL | 1 refills | Status: DC
Start: 2023-07-19 — End: 2024-01-17

## 2023-07-19 NOTE — Telephone Encounter (Signed)
 Last Visit Date: 06/28/23  Next Visit Date: 08/01/23

## 2023-08-01 ENCOUNTER — Ambulatory Visit
Admit: 2023-08-01 | Discharge: 2023-08-01 | Payer: PRIVATE HEALTH INSURANCE | Attending: Nurse Practitioner | Primary: Family Medicine

## 2023-08-01 ENCOUNTER — Encounter
Payer: PRIVATE HEALTH INSURANCE | Attending: Student in an Organized Health Care Education/Training Program | Primary: Family Medicine

## 2023-08-01 ENCOUNTER — Encounter: Payer: PRIVATE HEALTH INSURANCE | Attending: Family Medicine | Primary: Family Medicine

## 2023-08-01 ENCOUNTER — Inpatient Hospital Stay: Payer: PRIVATE HEALTH INSURANCE | Primary: Family Medicine

## 2023-08-01 VITALS — BP 124/78 | HR 95 | Ht 63.0 in | Wt 221.0 lb

## 2023-08-01 DIAGNOSIS — K529 Noninfective gastroenteritis and colitis, unspecified: Secondary | ICD-10-CM

## 2023-08-01 DIAGNOSIS — R221 Localized swelling, mass and lump, neck: Secondary | ICD-10-CM

## 2023-08-01 LAB — COMPREHENSIVE METABOLIC PANEL, FASTING
ALT: 35 U/L (ref 10–35)
AST: 21 U/L (ref 10–35)
Albumin/Globulin Ratio: 1.5 (ref 1.0–2.5)
Albumin: 4.5 g/dL (ref 3.5–5.2)
Alkaline Phosphatase: 68 U/L (ref 35–104)
Anion Gap: 12 mmol/L (ref 9–16)
BUN: 12 mg/dL (ref 6–20)
CO2: 28 mmol/L (ref 20–31)
Calcium: 9.8 mg/dL (ref 8.6–10.4)
Chloride: 101 mmol/L (ref 98–107)
Creatinine: 0.6 mg/dL (ref 0.6–0.9)
Est, Glom Filt Rate: >90 mL/min/{1.73_m2} (ref >60)
Glucose, Fasting: 125 mg/dL — ABNORMAL HIGH (ref 74–99)
Potassium: 4.2 mmol/L (ref 3.7–5.3)
Sodium: 141 mmol/L (ref 136–145)
Total Bilirubin: 0.3 mg/dL (ref 0.0–1.2)
Total Protein: 7.5 g/dL (ref 6.6–8.7)

## 2023-08-01 LAB — CBC WITH AUTO DIFFERENTIAL
Basophils %: 1 % (ref 0–2)
Basophils Absolute: 0.09 10*3/uL (ref 0.00–0.20)
Eosinophils %: 1 % (ref 1–4)
Eosinophils Absolute: 0.14 10*3/uL (ref 0.00–0.44)
Hematocrit: 43.6 % (ref 36.3–47.1)
Hemoglobin: 14.2 g/dL (ref 11.9–15.1)
Immature Granulocytes %: 0 %
Immature Granulocytes Absolute: 0.03 10*3/uL (ref 0.00–0.30)
Lymphocytes %: 21 % — ABNORMAL LOW (ref 24–43)
Lymphocytes Absolute: 2.23 10*3/uL (ref 1.10–3.70)
MCH: 30.6 pg (ref 25.2–33.5)
MCHC: 32.6 g/dL (ref 28.4–34.8)
MCV: 94 fL (ref 82.6–102.9)
MPV: 9.5 fL (ref 8.1–13.5)
Monocytes %: 7 % (ref 3–12)
Monocytes Absolute: 0.74 10*3/uL (ref 0.10–1.20)
NRBC Automated: 0 /100{WBCs}
Neutrophils %: 70 % — ABNORMAL HIGH (ref 36–65)
Neutrophils Absolute: 7.44 10*3/uL (ref 1.50–8.10)
Platelets: 350 10*3/uL (ref 138–453)
RBC: 4.64 m/uL (ref 3.95–5.11)
RDW: 12.5 % (ref 11.8–14.4)
WBC: 10.7 10*3/uL (ref 3.5–11.3)

## 2023-08-01 LAB — LIPID, FASTING
Chol/HDL Ratio: 3.6
Cholesterol, Fasting: 175 mg/dL (ref 0–199)
HDL: 49 mg/dL (ref >40)
LDL Cholesterol: 97 mg/dL (ref 0–100)
Triglyceride, Fasting: 147 mg/dL (ref 0–149)
VLDL: 29 mg/dL (ref 1–30)

## 2023-08-01 LAB — TSH: TSH: 0.58 u[IU]/mL (ref 0.27–4.20)

## 2023-08-01 LAB — POCT GLYCOSYLATED HEMOGLOBIN (HGB A1C): Hemoglobin A1C: 7 %

## 2023-08-01 LAB — T4, FREE: T4 Free: 1.5 ng/dL (ref 0.9–1.7)

## 2023-08-01 NOTE — Patient Instructions (Addendum)
 Ozempic weekly.  Elimination diet.    Food journal/ activity journal    Gluten 2 weeks  Dairy 2 weeks    Neuro/ psych evaluation    5,000 steps.    Follow up in walk in April 16, 30    If not make appointment for 3 months.

## 2023-08-01 NOTE — Progress Notes (Signed)
 Catherine Forbes [facility], [facility] [place] [facility]  [address]  [place] [address]  Dept: [phone]    Catherine Forbes is a 53 y.o. female Established patient, who presents to the walk-in clinic today with conditions/complaints as noted below:    Chief Complaint   Patient presents with    Diabetes     Ozempic weekly - pt is experiencing nausea, HA, upset stomach, and diarrhea that may not be related to the medication because she has this often. Pt does not monitor her BG at home. Pt denies sx of hyper/hypoglycemia. Her last hgb A1c was 7.5 on 04/30/23. She is due for a DM retinal exam next month - Catherine Forbes.          HPI:     HPI    Catherine Forbes presents to the office today for a follow up on DM. Her POCt hemoglobin A1c today is ay 7 which is an improvement from 3 months ago at 7.5.  She has not been taking her Ozempic consistently she has taken it for the last 3 weeks, but before then she has been taking it maybe 2 times a month.   Some nausea after ozempic and doesn't feel well the day after for like 6 hours and then she is ok.  Upset she is not losing weight on Ozempic.    Gaining weight.   Concerns of diarrhea. Chronic. Gallbladder taken out several years ago, diarrhea was present before then and continues the same now. No change in bowels with Ozempic. Blood work has some low sodium, chloride, and potassium levels. States she has craving for pickle juice.   Mother history of colon cancer at young age. Catherine Forbes gets a colonoscopy every 3-5 years.       Also concerned that the last few years WBC's and neutrophils are slightly elevated. CBC normal in 2021. Afterwards all CBC's abnormal.     Going through menopause. Follows with OBGYN. History of hysterectomy, but sill has both ovaries.     Staying busy with kids and church.    Going to physical therapy for left elbow and shoulder. Following with Ortho.       Very fatigued. Not that active. Active more in  the spring and summer. Feels that she eats healthy. Not many snacks. Eats fruit, vegetable, and meat.     Also noticed a mass on the back of her neck about 4 months ago. Non tender.  MRI of Cspine in may 2023 no notice of mass.       Past Medical History:   Diagnosis Date    Arthritis     Breast cyst August 18    Bronchitis     Carpal tunnel syndrome 03/14/2022    Carpal tunnel syndrome on both sides     Colon polyp     Diabetes mellitus (HCC)     Fatigue     GERD (gastroesophageal reflux disease)     Glucose intolerance     History of blood transfusion 1998    History of diverticulitis     History of vitamin D deficiency     Hyperlipidemia     Hypertension     IBS (irritable bowel syndrome)     Insomnia     Migraine     Obesity     OSA on CPAP     PCOS (polycystic ovarian syndrome)     PONV (postoperative nausea and vomiting)  Thyroid disease     Type 2 diabetes mellitus without complication     Under care of service provider 03/01/2022    pcp-[facility]-last visit aug 2023    Under care of service provider 03/01/2022    sleep disorder-dr jacob[place]-last visit nov 2023    Wears contact lenses        Current Outpatient Medications   Medication Sig Dispense Refill    spironolactone-hydroCHLOROthiazide (ALDACTAZIDE) 25-25 MG per tablet TAKE 1 TABLET BY MOUTH EVERY DAY 90 tablet 1    Semaglutide, 1 MG/DOSE, (OZEMPIC, 1 MG/DOSE,) 4 MG/3ML SOPN sc injection Inject 1 mg into the skin every 7 days 9 Adjustable Dose Pre-filled Pen Syringe 1    levothyroxine (SYNTHROID) 75 MCG tablet TAKE 1 TABLET BY MOUTH EVERY DAY 90 tablet 1    rosuvastatin (CRESTOR) 10 MG tablet Take 1 tablet by mouth every other day 45 tablet 1    Clobetasol Propionate 0.05 % SHAM WORK INTO A DAMPENED SCALP, LEAVE ON 5 MIN, WASH OFF. USE 2-3 TIMES WEEKLY AS NEEDED FOR FLARES      metroNIDAZOLE (METROGEL) 1 % gel APPLY ONCE A DAY TO FACE      triamcinolone (KENALOG) 0.1 % ointment Apply 1 Application topically 2 times daily      lisinopril  (PRINIVIL;ZESTRIL) 10 MG tablet Take 1 tablet by mouth daily 90 tablet 1    Solriamfetol HCl 150 MG TABS 150 mg      Zinc 100 MG TABS Take 1 tablet by mouth daily      vitamin D (D3-1000) 25 MCG (1000 UT) CAPS Take 1 capsule by mouth daily      SODIUM FLUORIDE 5000 SENSITIVE 1.1-5 % GEL       fluocinolone (DERMOTIC) 0.01 % OIL oil APPLY TWO DROPS TWICE A DAY TO EAR BOWLS AS NEEDED FOR FLARES (Patient not taking: Reported on 08/01/2023)       No current facility-administered medications for this visit.       No Known Allergies    :     Review of Systems   Constitutional:  Positive for fatigue. Negative for appetite change and fever.   Eyes: Negative.    Respiratory:  Negative for shortness of breath.    Cardiovascular: Negative.    Gastrointestinal:  Positive for diarrhea (chronic). Negative for abdominal pain.   Genitourinary:         Hysterectomy many years ago. Still had Ovaries. Follows with ONGYN. Going through menopause.    Musculoskeletal:  Positive for arthralgias and myalgias.        Left shoulder and elbow, PT and following with Ortho.   Skin: Negative.    Psychiatric/Behavioral:  Negative for decreased concentration and dysphoric mood. The patient is not nervous/anxious.        :     BP 124/78 (BP Site: Left Upper Arm, Patient Position: Sitting)   Pulse 95   Ht 1.6 m (5\' 3" )   Wt 100.2 kg (221 lb)   SpO2 98%   BMI 39.15 kg/m     Physical Exam  Vitals reviewed.   Constitutional:       Appearance: Normal appearance. She is not ill-appearing or diaphoretic.   HENT:      Head: Normocephalic.      Right Ear: External ear normal.      Left Ear: External ear normal.      Nose: Nose normal.      Mouth/Throat:      Mouth: Mucous membranes are  moist.   Eyes:      Conjunctiva/sclera: Conjunctivae normal.   Neck:      Vascular: No JVD.        Comments: Moveable mass.  Cardiovascular:      Rate and Rhythm: Normal rate.   Pulmonary:      Effort: Pulmonary effort is normal. No respiratory distress.      Breath  sounds: No stridor.   Abdominal:      Palpations: Abdomen is soft.   Musculoskeletal:      Cervical back: Normal range of motion and neck supple. No spinous process tenderness or muscular tenderness.   Skin:     General: Skin is warm.      Capillary Refill: Capillary refill takes less than 2 seconds.      Coloration: Skin is not ashen.   Neurological:      General: No focal deficit present.      Mental Status: She is alert and oriented to person, place, and time.   Psychiatric:         Attention and Perception: Attention normal.         Mood and Affect: Mood normal.         Behavior: Behavior normal. Behavior is cooperative.         Thought Content: Thought content normal.         Judgment: Judgment normal.           :     Assessment & Plan     1. Neck mass  -     US SOFT TISSUE LIMITED AREA; Future  2. Type 2 diabetes mellitus without complication, without long-term current use of insulin  -     POCT glycosylated hemoglobin (Hb A1C)  -     Lipid, Fasting; Future  -     Comprehensive Metabolic Panel, Fasting; Future  -     CBC with Auto Differential; Future  3. Hypothyroidism, unspecified type  -     TSH; Future  -     T4, Free; Future  4. Chronic diarrhea  -     Amylase Isoenzyme; Future  -     C-Reactive Protein; Future  5. Leukocytosis, unspecified type  -     C-Reactive Protein; Future       :      Ozempic weekly.  Elimination diet.  Food journal/ activity journal  Start with off Gluten 2 weeks  Then, Dairy 2 weeks  Neuro/ psych evaluation per [place]. Mother history of early onset dementia.   5,000 steps a day goal.  Follow up in walk in April 16, 30    Routine blood work today.   Evaluate for pancreatic insufficiency.     Consider Small bowel bacterial overgrowth. Could trial treatment with doxy if blood work normal.     Consider hematology referral.         No follow-ups on file.    No orders of the defined types were placed in this encounter.            Patient and/or parent given educational materials - see  patient instructions.  Discussed use, benefit, and side effects of prescribed medications.  All patient questions answered.  Patient and/or parent voiced understanding.      Electronically signed by Juanetta Gosling, APRN - CNPon 08/01/2023 at 12:55 PM

## 2023-08-01 NOTE — Assessment & Plan Note (Addendum)
 Hemoglobin A1C   Date Value Ref Range Status   04/30/2023 7.5 % Final   - currently on ozempic 1mg - has been on this dose for years

## 2023-08-01 NOTE — Assessment & Plan Note (Signed)
-   check lipid panel today 

## 2023-08-01 NOTE — Progress Notes (Unsigned)
 [facility]  [address]  [place], [place] [address]  Phone: [phone]  Fax: [phone]    Office Visit Note    Date of Visit: 08/01/2023   Patient Name: Catherine Forbes   Patient DOB:  01-06-1971     ASSESSMENT/PLAN   Ask if she wants to do a lab draw and POC A1c separately or if she'd rather just get labs done and find out the A1c once labs result.  1. Type 2 diabetes mellitus without complication, without long-term current use of insulin  Overview:  Ozempic   Jardiance stopped due to yeast infections  Assessment & Plan:  Hemoglobin A1C   Date Value Ref Range Status   04/30/2023 7.5 % Final   - currently on ozempic 1mg - has been on this dose for years  2. Essential hypertension  Overview:  Lisinopril, spironolactone and hctz.   Assessment & Plan:  - HTN labs ordered today  3. Mixed hyperlipidemia  Overview:  Rosuvastatin   Assessment & Plan:  - check lipid panel today  4. Never smoked tobacco    Assessment & Plan      There are no Patient Instructions on file for this visit.     No follow-ups on file.    COMMUNICATION   Questions/concerns answered. Patient verbalized and expressed understanding. Medications, laboratory testing, imaging, consultation, and follow up as documented in this encounter.     The patient (or guardian, if applicable) and other individuals in attendance with the patient were advised that Artificial Intelligence will be utilized during this visit to record, process the conversation to generate a clinical note and to support improvement of the AI technology. The patient (or guardian, if applicable) and other individuals in attendance at the appointment consented to the use of AI, including the recording.      An electronic signature was used to authenticate this note  - signed by Vanetta Shawl, MD on 08/01/2023 at 7:25 AM     HPI    Catherine Forbes is a 53 y.o. female who presents today to discuss No chief complaint on file.   Record Reviewed: 04/30/2023 PCP note, 04/30/2023 A1c,  05/03/2023 CMP,  History of Present Illness      REVIEW OF SYSTEM    As noted above in HPI.    PHYSICAL EXAM   There were no vitals taken for this visit.     Physical Exam    Physical Exam   LABS     No visits with results within 3 Month(s) from this visit.   Latest known visit with results is:   Admission on 05/03/2023, Discharged on 05/03/2023   Component Date Value Ref Range Status    Sodium 05/03/2023 135  135 - 144 mmol/L Final    Potassium 05/03/2023 3.7  3.7 - 5.3 mmol/L Final    Chloride 05/03/2023 95 (L)  98 - 107 mmol/L Final    CO2 05/03/2023 27  20 - 31 mmol/L Final    Anion Gap 05/03/2023 13  9 - 17 mmol/L Final    Glucose 05/03/2023 121 (H)  70 - 99 mg/dL Final    BUN 96/29/5284 13  6 - 20 mg/dL Final    Creatinine 13/24/4010 0.6  0.5 - 0.9 mg/dL Final    Est, Glom Filt Rate 05/03/2023 >90  >60 mL/min/1.23m2 Final    Comment:       These results are not intended for use in patients <58 years of age.  eGFR results are calculated without a race factor using the 2021 CKD-EPI equation.  Careful clinical correlation is recommended, particularly when comparing to results   calculated using previous equations.  The CKD-EPI equation is less accurate in patients with extremes of muscle mass, extra-renal   metabolism of creatine, excessive creatine ingestion, or following therapy that affects   renal tubular secretion.      Calcium 05/03/2023 9.9  8.6 - 10.4 mg/dL Final    Total Protein 05/03/2023 8.0  6.4 - 8.3 g/dL Final    Albumin 08/65/7846 4.8  3.5 - 5.2 g/dL Final    Albumin/Globulin Ratio 05/03/2023 1.5  1.0 - 2.5 Final    Total Bilirubin 05/03/2023 0.3  0.3 - 1.2 mg/dL Final    Alkaline Phosphatase 05/03/2023 69  35 - 104 U/L Final    ALT 05/03/2023 26  5 - 33 U/L Final    AST 05/03/2023 17  <32 U/L Final    WBC 05/03/2023 13.6 (H)  3.5 - 11.0 k/uL Final    RBC 05/03/2023 4.70  4.0 - 5.2 m/uL Final    Hemoglobin 05/03/2023 14.6  12.0 - 16.0 g/dL Final    Hematocrit 96/29/5284 43.3  36 - 46 % Final     MCV 05/03/2023 92.0  80 - 100 fL Final    MCH 05/03/2023 31.0  26 - 34 pg Final    MCHC 05/03/2023 33.7  31 - 37 g/dL Final    RDW 13/24/4010 13.0  12.5 - 15.4 % Final    Platelets 05/03/2023 366  140 - 450 k/uL Final    MPV 05/03/2023 7.3  6.0 - 12.0 fL Final    Neutrophils % 05/03/2023 71 (H)  36 - 66 % Final    Lymphocytes % 05/03/2023 20 (L)  24 - 44 % Final    Monocytes % 05/03/2023 6  2 - 11 % Final    Eosinophils % 05/03/2023 2  1 - 4 % Final    Basophils % 05/03/2023 1  0 - 2 % Final    Neutrophils Absolute 05/03/2023 9.70 (H)  1.8 - 7.7 k/uL Final    Lymphocytes Absolute 05/03/2023 2.60  1.0 - 4.8 k/uL Final    Monocytes Absolute 05/03/2023 0.80  0.1 - 1.2 k/uL Final    Eosinophils Absolute 05/03/2023 0.30  0.0 - 0.4 k/uL Final    Basophils Absolute 05/03/2023 0.10  0.0 - 0.2 k/uL Final    Lipase 05/03/2023 33  13 - 60 U/L Final    Color, UA 05/03/2023 Yellow  Yellow Final    Turbidity UA 05/03/2023 SLIGHTLY CLOUDY (A)  Clear Final    Glucose, Ur 05/03/2023 NEGATIVE  NEGATIVE mg/dL Final    Bilirubin, Urine 05/03/2023 NEGATIVE  NEGATIVE Final    Ketones, Urine 05/03/2023 NEGATIVE  NEGATIVE mg/dL Final    Specific Gravity, UA 05/03/2023 1.027  1.005 - 1.030 Final    Urine Hgb 05/03/2023 NEGATIVE  NEGATIVE Final    pH, Urine 05/03/2023 6.0  5.0 - 8.0 Final    Protein, UA 05/03/2023 NEGATIVE  NEGATIVE mg/dL Final    Urobilinogen, Urine 05/03/2023 Normal  0.0 - 1.0 EU/dL Final    Nitrite, Urine 05/03/2023 NEGATIVE  NEGATIVE Final    Leukocyte Esterase, Urine 05/03/2023 NEGATIVE  NEGATIVE Final    WBC, UA 05/03/2023 10 TO 20  0 - 5 /HPF Final    RBC, UA 05/03/2023 0 TO 2  0 - 2 /HPF Final    Epithelial Cells, UA 05/03/2023  10 TO 20  0 - 5 /HPF Final    Bacteria, UA 05/03/2023 MODERATE (A)  None Final    Mucus, UA 05/03/2023 1+ (A)  None Final    Specimen Description 05/03/2023 .CLEAN CATCH URINE   Final    Culture 05/03/2023 NO SIGNIFICANT GROWTH   Final        No results found for this visit on 08/01/23.    REVIEWED INFORMATION    I personally reviewed the patient's past medical history, current medications, allergies, surgical history, family history and social history.  Updates were made as necessary.  No Known Allergies    Patient Active Problem List   Diagnosis    Essential hypertension    Hyperlipidemia    Vitamin D deficiency    Obstructive sleep apnea    Type 2 diabetes mellitus without complication    Other specified hypothyroidism    Abdominal wall bulge    History of IBS    History of colon polyps       Past Medical History:   Diagnosis Date    Arthritis     Breast cyst August 18    Bronchitis     Carpal tunnel syndrome 03/14/2022    Carpal tunnel syndrome on both sides     Colon polyp     Diabetes mellitus (HCC)     Fatigue     GERD (gastroesophageal reflux disease)     Glucose intolerance     History of blood transfusion 1998    History of diverticulitis     History of vitamin D deficiency     Hyperlipidemia     Hypertension     IBS (irritable bowel syndrome)     Insomnia     Migraine     Obesity     OSA on CPAP     PCOS (polycystic ovarian syndrome)     PONV (postoperative nausea and vomiting)     Thyroid disease     Type 2 diabetes mellitus without complication (HCC)     Under care of service provider 03/01/2022    pcp-[facility]-last visit aug 2023    Under care of service provider 03/01/2022    sleep disorder-dr jacob[facility]-last visit nov 2023    Wears contact lenses        Past Surgical History:   Procedure Laterality Date    BREAST BIOPSY  1992    BREAST CYST EXCISION Right     Age 52    BREAST SURGERY Right     benign cyst removed     CARPAL TUNNEL RELEASE Right 03/14/2022    CARPAL TUNNEL RELEASE performed by Lorne Skeens, MD at [facility] OR    CHOLECYSTECTOMY      COLONOSCOPY N/A 05/29/2019    COLONOSCOPY WITH BIOPSY performed by Tyna Jaksch, MD at [facility] OR    COLONOSCOPY N/A 10/02/2022    COLONOSCOPY DIAGNOSTIC performed by Tery Sanfilippo, MD at [facility] ENDO    FINGER FRACTURE  SURGERY  1990    FOOT FRACTURE SURGERY  2012    HERNIA REPAIR      umbilical    HYSTERECTOMY (CERVIX STATUS UNKNOWN)      HYSTERECTOMY, VAGINAL      pt has had 2 miscarriages, and a D+C also    TONSILLECTOMY      UPPER GASTROINTESTINAL ENDOSCOPY      US BREAST BIOPSY W LOC DEVICE 1ST LESION RIGHT Right 01/26/2023    US BREAST BIOPSY W LOC DEVICE 1ST LESION  RIGHT 01/26/2023 [facility]        Social History     Socioeconomic History    Marital status: Married   Tobacco Use    Smoking status: Never    Smokeless tobacco: Never   Vaping Use    Vaping status: Never Used   Substance and Sexual Activity    Alcohol use: Never    Drug use: Never    Sexual activity: Yes     Partners: Male     Social Drivers of Health     Financial Resource Strain: Low Risk  (10/12/2022)    Overall Financial Resource Strain (CARDIA)     Difficulty of Paying Living Expenses: Not hard at all   Food Insecurity: No Food Insecurity (06/28/2023)    Hunger Vital Sign     Worried About Running Out of Food in the Last Year: Never true     Ran Out of Food in the Last Year: Never true   Transportation Needs: No Transportation Needs (06/28/2023)    PRAPARE - Therapist, art (Medical): No     Lack of Transportation (Non-Medical): No   Housing Stability: Low Risk  (06/28/2023)    Housing Stability Vital Sign     Unable to Pay for Housing in the Last Year: No     Number of Times Moved in the Last Year: 0     Homeless in the Last Year: No

## 2023-08-01 NOTE — Assessment & Plan Note (Signed)
-   HTN labs ordered today

## 2023-08-03 LAB — AMYLASE ISOENZYME
Pancreatic Amylase: 22 U/L (ref 13–53)
Salivary Amylase: 20 U/L (ref 9–86)
Total Amylase Isoenzymes: 42 U/L (ref 28–100)

## 2023-08-08 ENCOUNTER — Ambulatory Visit
Admit: 2023-08-08 | Discharge: 2023-08-08 | Payer: PRIVATE HEALTH INSURANCE | Attending: Nurse Practitioner | Primary: Family Medicine

## 2023-08-08 VITALS — BP 128/78 | HR 95 | Resp 18 | Ht 62.0 in | Wt 220.0 lb

## 2023-08-08 DIAGNOSIS — H00015 Hordeolum externum left lower eyelid: Principal | ICD-10-CM

## 2023-08-08 MED ORDER — ERYTHROMYCIN 5 MG/GM OP OINT
5 | OPHTHALMIC | 0 refills | Status: AC
Start: 2023-08-08 — End: 2023-08-18

## 2023-08-08 MED ORDER — DOXYCYCLINE HYCLATE 100 MG PO CAPS
100 | ORAL_CAPSULE | Freq: Two times a day (BID) | ORAL | 0 refills | Status: AC
Start: 2023-08-08 — End: 2023-08-22

## 2023-08-08 NOTE — Progress Notes (Signed)
 [facility], [facility] [place] [facility]  [address]  [place] [address]  Dept: [phone]    LYNNANN KNUDSEN is a 53 y.o. female Established patient, who presents to the walk-in clinic today with conditions/complaints as noted below:    Chief Complaint   Patient presents with    Eye Problem     Left          HPI:     HPI      Catherine Forbes presents to the office today with concerns of left eye pain. Started yesterday. Feels like a stye. Vision is normal. Denies URI. Has been using warm compress.     Also following up fro last week. Labs reviewed. Negative for pancreatic insufficiency. CBC still off.   Continues with diarrhea. Has not started elimination diet yet. Will get started next week when food from house is all out.     Had a right breast biopsy last year. Lymph node still feels swollen. Family history of breast cancer, lymphoma, and colon cancer. Would like further evaluation.             Past Medical History:   Diagnosis Date    Arthritis     Breast cyst August 18    Bronchitis     Carpal tunnel syndrome 03/14/2022    Carpal tunnel syndrome on both sides     Colon polyp     Diabetes mellitus (HCC)     Fatigue     GERD (gastroesophageal reflux disease)     Glucose intolerance     History of blood transfusion 1998    History of diverticulitis     History of vitamin D deficiency     Hyperlipidemia     Hypertension     IBS (irritable bowel syndrome)     Insomnia     Migraine     Obesity     OSA on CPAP     PCOS (polycystic ovarian syndrome)     PONV (postoperative nausea and vomiting)     Thyroid disease     Type 2 diabetes mellitus without complication     Under care of service provider 03/01/2022    pcp-[facility]-last visit aug 2023    Under care of service provider 03/01/2022    sleep disorder-dr jacob[facility]-last visit nov 2023    Wears contact lenses        Current Outpatient Medications   Medication Sig Dispense Refill     doxycycline hyclate (VIBRAMYCIN) 100 MG capsule Take 1 capsule by mouth 2 times daily for 14 days 28 capsule 0    erythromycin (ROMYCIN) 5 MG/GM ophthalmic ointment 1/2 inch to lower lid of the left eye 4 times a day for 10 days 1 each 0    spironolactone-hydroCHLOROthiazide (ALDACTAZIDE) 25-25 MG per tablet TAKE 1 TABLET BY MOUTH EVERY DAY 90 tablet 1    Semaglutide, 1 MG/DOSE, (OZEMPIC, 1 MG/DOSE,) 4 MG/3ML SOPN sc injection Inject 1 mg into the skin every 7 days 9 Adjustable Dose Pre-filled Pen Syringe 1    levothyroxine (SYNTHROID) 75 MCG tablet TAKE 1 TABLET BY MOUTH EVERY DAY 90 tablet 1    SODIUM FLUORIDE 5000 SENSITIVE 1.1-5 % GEL       fluocinolone (DERMOTIC) 0.01 % OIL oil       Clobetasol Propionate 0.05 % SHAM WORK INTO A DAMPENED SCALP, LEAVE ON 5 MIN, WASH OFF. USE 2-3 TIMES WEEKLY AS NEEDED FOR FLARES  metroNIDAZOLE (METROGEL) 1 % gel APPLY ONCE A DAY TO FACE      triamcinolone (KENALOG) 0.1 % ointment Apply 1 Application topically 2 times daily      Solriamfetol HCl 150 MG TABS 150 mg      Zinc 100 MG TABS Take 1 tablet by mouth daily      vitamin D (D3-1000) 25 MCG (1000 UT) CAPS Take 1 capsule by mouth daily      rosuvastatin (CRESTOR) 10 MG tablet Take 1 tablet by mouth every other day 45 tablet 1    lisinopril (PRINIVIL;ZESTRIL) 10 MG tablet Take 1 tablet by mouth daily 90 tablet 1     No current facility-administered medications for this visit.       No Known Allergies    :     Review of Systems   Constitutional:  Positive for fatigue. Negative for appetite change and fever.   Eyes:  Positive for discharge.        Pain left lower lid.   Respiratory:  Negative for shortness of breath.    Cardiovascular: Negative.    Gastrointestinal:  Positive for diarrhea (chronic). Negative for abdominal pain.   Genitourinary:         Hysterectomy many years ago. Still had Ovaries. Follows with ONGYN. Going through menopause.    Musculoskeletal:  Positive for arthralgias and myalgias.        Left shoulder  and elbow, PT and following with Ortho.   Skin: Negative.    Psychiatric/Behavioral:  Negative for decreased concentration and dysphoric mood. The patient is not nervous/anxious.        :     BP 128/78   Pulse 95   Resp 18   Ht 1.575 m (5\' 2" )   Wt 99.8 kg (220 lb)   SpO2 98%   BMI 40.24 kg/m     Physical Exam  Vitals reviewed.   Constitutional:       Appearance: Normal appearance. She is not ill-appearing or diaphoretic.   HENT:      Head: Normocephalic.      Right Ear: External ear normal.      Left Ear: External ear normal.      Nose: Nose normal.      Mouth/Throat:      Mouth: Mucous membranes are moist.   Eyes:      General: No scleral icterus.        Left eye: Hordeolum present.     Conjunctiva/sclera:      Left eye: Left conjunctiva is injected. Chemosis present. No exudate or hemorrhage.  Neck:      Vascular: No JVD.        Comments: Moveable mass.  Cardiovascular:      Rate and Rhythm: Normal rate.   Pulmonary:      Effort: Pulmonary effort is normal. No respiratory distress.      Breath sounds: No stridor.   Abdominal:      Palpations: Abdomen is soft.   Musculoskeletal:      Cervical back: Normal range of motion and neck supple. No spinous process tenderness or muscular tenderness.   Skin:     General: Skin is warm.      Capillary Refill: Capillary refill takes less than 2 seconds.      Coloration: Skin is not ashen.   Neurological:      General: No focal deficit present.      Mental Status: She is alert and oriented to person, place,  and time.   Psychiatric:         Attention and Perception: Attention normal.         Mood and Affect: Mood normal.         Behavior: Behavior normal. Behavior is cooperative.         Thought Content: Thought content normal.         Judgment: Judgment normal.           :     Assessment & Plan     1. Hordeolum externum of left lower eyelid  -     erythromycin (ROMYCIN) 5 MG/GM ophthalmic ointment; 1/2 inch to lower lid of the left eye 4 times a day for 10 days, Disp-1  each, R-0, Normal  2. Small intestinal bacterial overgrowth  -     doxycycline hyclate (VIBRAMYCIN) 100 MG capsule; Take 1 capsule by mouth 2 times daily for 14 days, Disp-28 capsule, R-0Normal  3. Abnormal CBC  -     [place] - Al-Khalili, Delmar Landau, MD, Hematology/Oncology, [place]  4. Axillary pain, right  -     [place] - Al-Khalili, Delmar Landau, MD, Hematology/Oncology, [place]  5. Family history of cancer  -     [place] - Al-Khalili, Delmar Landau, MD, Hematology/Oncology, [place]       :    Labs from last week reviewed.  Complete US of neck as ordered from last year.  Refer to [facility]monc for further evaluation.     Warm compress to left eye.  Ointment as prescribed.   Ocusoft cleaner.     Trial of doxy for chronic diarrhea.   Reviewed side effects.   Elimination diet.  Consider GI referral.    Follow up in office with worsening symptoms.      Return if symptoms worsen or fail to improve.    Orders Placed This Encounter   Medications    doxycycline hyclate (VIBRAMYCIN) 100 MG capsule     Sig: Take 1 capsule by mouth 2 times daily for 14 days     Dispense:  28 capsule     Refill:  0    erythromycin (ROMYCIN) 5 MG/GM ophthalmic ointment     Sig: 1/2 inch to lower lid of the left eye 4 times a day for 10 days     Dispense:  1 each     Refill:  0             Patient and/or parent given educational materials - see patient instructions.  Discussed use, benefit, and side effects of prescribed medications.  All patient questions answered.  Patient and/or parent voiced understanding.      Electronically signed by Juanetta Gosling, APRN - CNPon 08/08/2023 at 9:49 AM

## 2023-08-09 ENCOUNTER — Encounter

## 2023-08-09 ENCOUNTER — Inpatient Hospital Stay: Admit: 2023-08-09 | Payer: PRIVATE HEALTH INSURANCE | Primary: Family Medicine

## 2023-08-09 DIAGNOSIS — R221 Localized swelling, mass and lump, neck: Secondary | ICD-10-CM

## 2023-08-09 MED ORDER — LISINOPRIL 10 MG PO TABS
10 | ORAL_TABLET | Freq: Every day | ORAL | 1 refills | 90.00000 days | Status: DC
Start: 2023-08-09 — End: 2024-02-11

## 2023-08-09 NOTE — Telephone Encounter (Signed)
 Last Visit Date: 06/28/23  Next Visit Date: none

## 2023-08-17 ENCOUNTER — Encounter

## 2023-08-17 NOTE — Telephone Encounter (Signed)
 Last Visit Date: 06/28/2023   Next Visit Date: Visit date not found

## 2023-08-20 MED ORDER — ROSUVASTATIN CALCIUM 10 MG PO TABS
10 | ORAL_TABLET | ORAL | 1 refills | 90.00000 days | Status: DC
Start: 2023-08-20 — End: 2024-02-11

## 2023-08-24 ENCOUNTER — Institutional Professional Consult (permissible substitution)
Admit: 2023-08-24 | Discharge: 2023-08-24 | Payer: PRIVATE HEALTH INSURANCE | Attending: Internal Medicine | Primary: Family Medicine

## 2023-08-24 ENCOUNTER — Inpatient Hospital Stay: Payer: PRIVATE HEALTH INSURANCE | Primary: Family Medicine

## 2023-08-24 VITALS — BP 125/78 | HR 97 | Temp 98.50000°F | Resp 18 | Wt 220.0 lb

## 2023-08-24 DIAGNOSIS — N6331 Unspecified lump in axillary tail of the right breast: Secondary | ICD-10-CM

## 2023-08-24 DIAGNOSIS — D72828 Other elevated white blood cell count: Secondary | ICD-10-CM

## 2023-08-24 LAB — CBC WITH AUTO DIFFERENTIAL
Basophils %: 1 % (ref 0–2)
Basophils Absolute: 0.1 10*3/uL (ref 0.0–0.2)
Eosinophils %: 2 % (ref 1–4)
Eosinophils Absolute: 0.2 10*3/uL (ref 0.0–0.4)
Hematocrit: 41.2 % (ref 36–46)
Hemoglobin: 13.7 g/dL (ref 12.0–16.0)
Lymphocytes %: 17 % — ABNORMAL LOW (ref 24–44)
Lymphocytes Absolute: 1.9 10*3/uL (ref 1.0–4.8)
MCH: 30.8 pg (ref 26–34)
MCHC: 33.3 g/dL (ref 31–37)
MCV: 92.4 fL (ref 80–100)
MPV: 7.3 fL (ref 6.0–12.0)
Monocytes %: 6 % (ref 2–11)
Monocytes Absolute: 0.7 10*3/uL (ref 0.1–1.2)
Neutrophils %: 74 % — ABNORMAL HIGH (ref 36–66)
Neutrophils Absolute: 8.4 10*3/uL — ABNORMAL HIGH (ref 1.8–7.7)
Platelets: 325 10*3/uL (ref 140–450)
RBC: 4.46 m/uL (ref 4.0–5.2)
RDW: 12.9 % (ref 12.5–15.4)
WBC: 11.3 10*3/uL — ABNORMAL HIGH (ref 3.5–11.0)

## 2023-08-24 LAB — COMPREHENSIVE METABOLIC PANEL
ALT: 30 U/L (ref 5–33)
AST: 20 U/L (ref <32)
Albumin/Globulin Ratio: 1.6 (ref 1.0–2.5)
Albumin: 4.6 g/dL (ref 3.5–5.2)
Alkaline Phosphatase: 66 U/L (ref 35–104)
Anion Gap: 11 mmol/L (ref 9–17)
BUN: 13 mg/dL (ref 6–20)
CO2: 27 mmol/L (ref 20–31)
Calcium: 9.5 mg/dL (ref 8.6–10.4)
Chloride: 99 mmol/L (ref 98–107)
Creatinine: 0.7 mg/dL (ref 0.5–0.9)
Est, Glom Filt Rate: >90 mL/min/{1.73_m2} (ref >60)
Glucose: 142 mg/dL — ABNORMAL HIGH (ref 70–99)
Potassium: 3.5 mmol/L — ABNORMAL LOW (ref 3.7–5.3)
Sodium: 137 mmol/L (ref 135–144)
Total Bilirubin: 0.3 mg/dL (ref 0.3–1.2)
Total Protein: 7.5 g/dL (ref 6.4–8.3)

## 2023-08-24 LAB — C-REACTIVE PROTEIN: CRP: 4.5 mg/L (ref 0.0–5.0)

## 2023-08-24 LAB — MONOCLONAL PANEL

## 2023-08-24 NOTE — Progress Notes (Signed)
 Catherine Forbes                                                                                                                  08/24/2023  MRN:   [id number]  Date of Birth:  03/27/1971  PCP:                           Julianne Octave, DO  Referring Physician: Marisa Sickles  Treating Physician Name: Alvina Axon, MD      Reason for consultation:  Chief Complaint   Patient presents with    Consultation     Mass of axillary tail of right breast  [id number] CBC        Current problems:  Right breast mass  Leukocytosis with neutrophilia    Active and recent treatments:  Ultrasound right breast  Workup for leukocytosis    Summary of Case/History:    Catherine Forbes a 53 y.o.female is a patient with right breast mass and discomfort and leukocytosis presents to the clinic to establish care and for further workup and evaluation  History of Present Illness  The patient is a 53 year old female who presents for evaluation of an enlarged lymph node.    She reports experiencing soreness today, which is more pronounced than usual. The lymph node exhibits intermittent swelling, and a sonogram has been performed on it. Blood work from 2021 indicated changes in her neutrophil count, which were consistently elevated during periods of swelling. Her most recent white blood cell count was within the normal range at 11, but it typically tends to be slightly elevated. She was referred to our facility for a more detailed evaluation. There is a dull, achy pain in the area of the lymph node. Constant fatigue and a general feeling of inflammation are reported. She expresses a desire to avoid long-term medication use and wishes to identify the root cause of her symptoms.    In July 2024, a CT scan of the abdomen and pelvis was performed, which did not show any concerning findings. A mammogram done in August 2024 revealed a benign-appearing node with no morphologically abnormal lymph node. An ultrasound performed in August 2024 showed a  circumcised anechoic cyst in the right breast, correlating with the mammographic finding, with no evidence of any solid mass. A biopsy done in October 2024 confirmed a benign lymph node, negative for malignancy. Thyroid levels and a chemistry panel drawn were unremarkable. A CBC done on 08/01/2023 showed neutrophils within the normal range, although they have been high previously.    She has a history of a rapidly enlarging breast lump during her third year of college, which was subsequently excised and identified as an enlarged lymph node. She is not a smoker. She is currently experiencing hot flashes and is uncertain about her menopausal status. She has undergone a total hysterectomy but retains her ovaries.    PAST SURGICAL HISTORY:  Total hysterectomy.  Excised breast lump during college.  SOCIAL HISTORY  She does not smoke. She is a stay-at-home mom.    FAMILY HISTORY  Her mother had lupus and Raynaud's disease.    Past Medical History:   Past Medical History:   Diagnosis Date    Arthritis     Breast cyst August 18    Bronchitis     Carpal tunnel syndrome 03/14/2022    Carpal tunnel syndrome on both sides     Colon polyp     Diabetes mellitus (HCC)     Fatigue     GERD (gastroesophageal reflux disease)     Glucose intolerance     History of blood transfusion 1998    History of diverticulitis     History of vitamin D deficiency     Hyperlipidemia     Hypertension     IBS (irritable bowel syndrome)     Insomnia     Migraine     Obesity     OSA on CPAP     PCOS (polycystic ovarian syndrome)     PONV (postoperative nausea and vomiting)     Thyroid disease     Type 2 diabetes mellitus without complication (HCC)     Under care of service provider 03/01/2022    pcp-[facility]-last visit aug 2023    Under care of service provider 03/01/2022    sleep disorder-[facility]-last visit nov 2023    Wears contact lenses      Past Surgical History:  Past Surgical History:   Procedure Laterality Date    BREAST  BIOPSY  1992    BREAST CYST EXCISION Right     Age 30    BREAST SURGERY Right     benign cyst removed     CARPAL TUNNEL RELEASE Right 03/14/2022    CARPAL TUNNEL RELEASE performed by Merline Starr, MD at [facility] OR    CHOLECYSTECTOMY      COLONOSCOPY N/A 05/29/2019    COLONOSCOPY WITH BIOPSY performed by Oretha Birch, MD at [facility] OR    COLONOSCOPY N/A 10/02/2022    COLONOSCOPY DIAGNOSTIC performed by Keane Passe, MD at [facility] ENDO    FINGER FRACTURE SURGERY  1990    FOOT FRACTURE SURGERY  2012    HERNIA REPAIR      umbilical    HYSTERECTOMY (CERVIX STATUS UNKNOWN)      HYSTERECTOMY, VAGINAL      pt has had 2 miscarriages, and a D+C also    TONSILLECTOMY      UPPER GASTROINTESTINAL ENDOSCOPY      US  BREAST BIOPSY W LOC DEVICE 1ST LESION RIGHT Right 01/26/2023    US  BREAST BIOPSY W LOC DEVICE 1ST LESION RIGHT 01/26/2023 [facility]     Patient Family Social History:  Family History   Problem Relation Age of Onset    Colon Cancer Mother         before age 71     Other Mother         lupus     Diabetes Mother     Dementia Mother     Other Father         thyroid disease    COPD Father     Lymphoma Paternal Grandmother 63    Breast Cancer Paternal Cousin 37     Social History     Socioeconomic History    Marital status: Married     Spouse name: None    Number of children: None    Years of  education: None    Highest education level: None   Tobacco Use    Smoking status: Never    Smokeless tobacco: Never   Vaping Use    Vaping status: Never Used   Substance and Sexual Activity    Alcohol use: Never    Drug use: Never    Sexual activity: Yes     Partners: Male     Social Drivers of Health     Financial Resource Strain: Low Risk  (10/12/2022)    Overall Financial Resource Strain (CARDIA)     Difficulty of Paying Living Expenses: Not hard at all   Food Insecurity: No Food Insecurity (06/28/2023)    Hunger Vital Sign     Worried About Running Out of Food in the Last Year: Never true     Ran Out of Food in the  Last Year: Never true   Transportation Needs: No Transportation Needs (06/28/2023)    PRAPARE - Therapist, art (Medical): No     Lack of Transportation (Non-Medical): No   Housing Stability: Low Risk  (06/28/2023)    Housing Stability Vital Sign     Unable to Pay for Housing in the Last Year: No     Number of Times Moved in the Last Year: 0     Homeless in the Last Year: No     Current Medications:  Current Outpatient Medications   Medication Sig Dispense Refill    Elderberry 500 MG CAPS Take by mouth      vitamin C (ASCORBIC ACID) 500 MG tablet Take 1 tablet by mouth daily      rosuvastatin  (CRESTOR ) 10 MG tablet TAKE 1 TABLET BY MOUTH EVERY OTHER DAY 45 tablet 1    lisinopril  (PRINIVIL ;ZESTRIL ) 10 MG tablet TAKE 1 TABLET BY MOUTH EVERY DAY 90 tablet 1    spironolactone -hydroCHLOROthiazide (ALDACTAZIDE) 25-25 MG per tablet TAKE 1 TABLET BY MOUTH EVERY DAY 90 tablet 1    Semaglutide , 1 MG/DOSE, (OZEMPIC , 1 MG/DOSE,) 4 MG/3ML SOPN sc injection Inject 1 mg into the skin every 7 days 9 Adjustable Dose Pre-filled Pen Syringe 1    levothyroxine  (SYNTHROID ) 75 MCG tablet TAKE 1 TABLET BY MOUTH EVERY DAY 90 tablet 1    SODIUM FLUORIDE 5000 SENSITIVE 1.1-5 % GEL       fluocinolone (DERMOTIC) 0.01 % OIL oil       Clobetasol Propionate 0.05 % SHAM WORK INTO A DAMPENED SCALP, LEAVE ON 5 MIN, WASH OFF. USE 2-3 TIMES WEEKLY AS NEEDED FOR FLARES      metroNIDAZOLE (METROGEL) 1 % gel APPLY ONCE A DAY TO FACE      triamcinolone  (KENALOG ) 0.1 % ointment Apply 1 Application topically 2 times daily      Solriamfetol HCl 150 MG TABS 150 mg      Zinc 100 MG TABS Take 1 tablet by mouth daily      vitamin D (D3-1000) 25 MCG (1000 UT) CAPS Take 1 capsule by mouth daily       No current facility-administered medications for this visit.     Allergies:   Patient has no known allergies.    Review of Systems:    Constitutional: No fever or chills. No night sweats, no weight loss   Eyes: No eye discharge, double vision, or  eye pain   HEENT: negative for sore mouth, sore throat, hoarseness and voice change   Respiratory: negative for cough , sputum, dyspnea, wheezing, hemoptysis, chest pain  Cardiovascular: negative for chest pain, dyspnea, palpitations, orthopnea, PND   Gastrointestinal: negative for nausea, vomiting, diarrhea, constipation, abdominal pain, Dysphagia, hematemesis and hematochezia   Genitourinary: negative for frequency, dysuria, nocturia, urinary incontinence, and hematuria   Integument: negative for rash, skin lesions, bruises.   Hematologic/Lymphatic: negative for easy bruising, bleeding, lymphadenopathy, or petechiae   Endocrine: negative for heat or cold intolerance,weight changes, change in bowel habits and hair loss   Musculoskeletal: negative for myalgias, arthralgias, pain, joint swelling,and bone pain   Neurological: negative for headaches, dizziness, seizures, weakness, numbness    Physical Exam:  Vitals: BP 125/78   Pulse 97   Temp 98.5 F (36.9 C) (Temporal)   Resp 18   Wt 99.8 kg (220 lb)   SpO2 95%   BMI 40.24 kg/m   General appearance - well appearing, no in pain or distress  Mental status - AAO X3  Eyes - pupils equal and reactive, extraocular eye movements intact  Mouth - mucous membranes moist, pharynx normal without lesions  Neck - supple, no significant adenopathy  Lymphatics - no palpable lymphadenopathy, no hepatosplenomegaly  Chest - clear to auscultation, no wheezes, rales or rhonchi, symmetric air entry  Heart - normal rate, regular rhythm, normal S1, S2, no murmurs  Abdomen - soft, nontender, nondistended, no masses or organomegaly  Neurological - alert, oriented, normal speech, no focal findings or movement disorder noted  Extremities - peripheral pulses normal, no pedal edema, no clubbing or cyanosis  Skin - normal coloration and turgor, no rashes, no suspicious skin lesions noted   Breast-examination performed in the presence of female nurse.  Examination reveals palpable area of  thickening in the right upper quadrant which is also somewhat tender on examination.    DATA:    CBC:   Recent Labs     08/24/23  1224   WBC 11.3*   HGB 13.7   PLT 325     BMP:    Recent Labs     08/24/23  1224   NA 137   K 3.5*   CL 99   CO2 27   BUN 13   CREATININE 0.7   GLUCOSE 142*     Hepatic:   Recent Labs     08/24/23  1224   AST 20   ALT 30   BILITOT 0.3   ALKPHOS 66     INR: No results for input(s): "INR" in the last 72 hours.  ZHY:QMVHQIO input(s): "PTT"    US  HEAD NECK SOFT TISSUE  Result Date: 08/09/2023  EXAMINATION: ULTRASOUND OF THE HEAD AND NECK SOFT TISSUES 08/09/2023 1:16 pm COMPARISON: Cervical spine MRI 09/06/2021 HISTORY: ORDERING SYSTEM PROVIDED HISTORY: Neck mass TECHNOLOGIST PROVIDED HISTORY: mass back of neck. FINDINGS: Normal skin, subcutaneous tissues, and muscle at the area of concern in the posterior neck near the hairline with no evident focal lesion nor cervical lymphadenopathy.     Normal sonographic appearance at the area of concern in the posterior left neck.  Recommend clinical follow-up.     Impression:  Right breast mass  Leukocytosis with neutrophilia    Plan:  I had a detailed discussion with the patient and personally went over results of lab work-up imaging studies and other relevant clinical data.  Assessment & Plan  1. Enlarged lymph node.  A history of an enlarged lymph node has been present since 2021, with occasional swelling and associated dull, achy pain. Previous imaging, including a CT abdomen and pelvis in 10/2022 and a mammogram  in 11/2022, showed benign findings. An ultrasound in 11/2022 revealed benign-appearing nodes and a 1.4 cm anechoic cyst in the right breast, correlating with mammographic findings. A biopsy in 01/2023 confirmed a benign lymph node, negative for malignancy. The patient has had high neutrophil counts in the past, but recent CBC results from this month show normal levels. Differential diagnoses include reactive lymphadenopathy, infection, or  underlying bone marrow disorder. A CT scan of the chest with contrast will be ordered to further evaluate the lymph nodes. Blood tests will be conducted today to assess for any underlying conditions. If the lymph nodes are found to be reactive, a trial of antibiotics or NSAIDs such as Motrin or Aleve may be considered.    2. Perimenopausal symptoms.  Experiencing hot flashes and other symptoms suggestive of perimenopause. The patient has retained her ovaries despite having undergone a full hysterectomy. Advised to monitor symptoms and report any significant changes.    3.  Breast lump  Will obtain ultrasound of right breast.  Patient may need further workup depending upon the results.  Patient denies any nipple discharge but examination reveals palpable/area of thickening in the right upper quadrant which is tender on palpation.    Follow-up  Follow up in 2 to 3 weeks.      Alvina Axon, MD    I spent a total of 60 minutes on the date of the service which included preparing to see the patient, face-to-face patient care, completing clinical documentation, obtaining and/or reviewing separately obtained history, performing a medically appropriate examination, counseling and educating the patient/family/caregiver, ordering medications, tests, or procedures, communicating with other HCPs (not separately reported), independently interpreting results (not separately reported), communicating results to the patient/family/caregiver and care coordination (not separately reported).  This note is created with the assistance of a speech recognition program.  While intending to generate a document that actually reflects the content of the visit, the document can still have some errors including those of syntax and sound a like substitutions which may escape proof reading.  It such instances, actual meaning can be extrapolated by contextual diversion.

## 2023-08-24 NOTE — Telephone Encounter (Addendum)
 Labs now - Done 08/24/23    Ct chest - Sched. - 09/04/23  at  2:15 pm [facility]   Us  breast - Sched. 09/04/23 at   1 pm  Rv in 2-3 weeks  sched. 09/12/23 11:45 am

## 2023-08-24 NOTE — Patient Instructions (Addendum)
 Labs now   Ct chest   Us  breast  Rv in 2-3 weeks

## 2023-08-25 ENCOUNTER — Inpatient Hospital Stay: Admit: 2023-08-25 | Payer: PRIVATE HEALTH INSURANCE | Primary: Family Medicine

## 2023-08-25 DIAGNOSIS — N6331 Unspecified lump in axillary tail of the right breast: Secondary | ICD-10-CM

## 2023-08-25 MED ORDER — SODIUM CHLORIDE 0.9 % IV BOLUS
0.9 | Freq: Once | INTRAVENOUS | Status: AC
Start: 2023-08-25 — End: 2023-08-25
  Administered 2023-08-25: 19:00:00 100 mL via INTRAVENOUS

## 2023-08-25 MED ORDER — NORMAL SALINE FLUSH 0.9 % IV SOLN
0.9 | INTRAVENOUS | Status: DC | PRN
Start: 2023-08-25 — End: 2023-08-28
  Administered 2023-08-25: 19:00:00 10 mL via INTRAVENOUS

## 2023-08-25 MED ORDER — IOPAMIDOL 76 % IV SOLN
76 | Freq: Once | INTRAVENOUS | Status: AC | PRN
Start: 2023-08-25 — End: 2023-08-25
  Administered 2023-08-25: 19:00:00 75 mL via INTRAVENOUS

## 2023-08-26 LAB — ANA SCREEN WITH REFLEX
ANA: NEGATIVE
Anti ds DNA: <0.5 [IU]/mL (ref <10.0)
ENA Antibodies Screen: 0.2 U/mL (ref <0.7)

## 2023-08-28 LAB — MONOCLONAL PANEL
Albumin %: 60 % (ref 56–66)
Albumin (calculated): 4.2 g/dL (ref 3.2–5.2)
Alpha 1 %: 4 % (ref 3–5)
Alpha 2 %: 9 % (ref 7–12)
Alpha-1-Globulin: 0.3 g/dL (ref 0.1–0.4)
Alpha-2-Globulin: 0.6 g/dL (ref 0.5–0.9)
Beta Globulin: 1 g/dL (ref 0.7–1.4)
Beta Percent: 14 % — ABNORMAL HIGH (ref 8–13)
Free Kappa/Lambda Ratio: 1.04 (ref 0.22–1.74)
Gamma Globulin %: 13 % (ref 11–19)
Gamma Globulin: 0.9 g/dL (ref 0.5–1.5)
ITYP Interpretation: NEGATIVE
Kappa Free Light Chains QNT: 19.2 mg/L (ref <20.7)
Lambda Free Light Chains QNT: 18.4 mg/L (ref 4.2–27.7)
Protein Electrophoresis, Serum: NEGATIVE g/dL
Total Prot. Sum,%: 100 % (ref 98–102)
Total Prot. Sum: 7 g/dL (ref 6.3–8.2)
Total Protein: 7 g/dL (ref 6.6–8.7)

## 2023-08-29 ENCOUNTER — Encounter

## 2023-08-29 LAB — MISCELLANEOUS SENDOUT 2

## 2023-08-30 LAB — BCR-ABL QUANTITATIVE
BCR-ABL Quantitative: NOT DETECTED
BCR-ABL1, Percent: 0 %

## 2023-09-04 ENCOUNTER — Ambulatory Visit: Payer: PRIVATE HEALTH INSURANCE | Primary: Family Medicine

## 2023-09-12 ENCOUNTER — Encounter: Payer: PRIVATE HEALTH INSURANCE | Attending: Internal Medicine | Primary: Family Medicine

## 2023-09-14 ENCOUNTER — Encounter

## 2023-09-14 ENCOUNTER — Inpatient Hospital Stay: Admit: 2023-09-14 | Payer: PRIVATE HEALTH INSURANCE | Attending: Internal Medicine | Primary: Family Medicine

## 2023-09-14 DIAGNOSIS — N6331 Unspecified lump in axillary tail of the right breast: Secondary | ICD-10-CM

## 2023-09-21 ENCOUNTER — Encounter: Payer: PRIVATE HEALTH INSURANCE | Attending: Internal Medicine | Primary: Family Medicine

## 2023-09-21 ENCOUNTER — Ambulatory Visit
Admit: 2023-09-21 | Discharge: 2023-09-21 | Payer: PRIVATE HEALTH INSURANCE | Attending: Internal Medicine | Primary: Family Medicine

## 2023-09-21 VITALS — BP 123/77 | HR 101 | Temp 98.50000°F | Resp 16 | Wt 223.3 lb

## 2023-09-21 DIAGNOSIS — N6331 Unspecified lump in axillary tail of the right breast: Secondary | ICD-10-CM

## 2023-09-21 NOTE — Progress Notes (Signed)
 Catherine Forbes                                                                                                                  09/21/2023  MRN:   [id number]  Date of Birth:  [birth year]  PCP:                           Julianne Octave, DO  Referring Physician: No ref. provider found  Treating Physician Name: Alvina Axon, MD      Reason for visit:  Chief Complaint   Patient presents with    Follow-up     Review Scans     Summary of Case/History:    Catherine Forbes a 53 y.o.female is a patient with right breast mass and discomfort and leukocytosis presents to the clinic to establish care and for further workup and evaluation  History of Present Illness  The patient is a 53 year old female who presents for evaluation of an enlarged lymph node.    She reports experiencing soreness today, which is more pronounced than usual. The lymph node exhibits intermittent swelling, and a sonogram has been performed on it. Blood work from 2021 indicated changes in her neutrophil count, which were consistently elevated during periods of swelling. Her most recent white blood cell count was within the normal range at 11, but it typically tends to be slightly elevated. She was referred to our facility for a more detailed evaluation. There is a dull, achy pain in the area of the lymph node. Constant fatigue and a general feeling of inflammation are reported. She expresses a desire to avoid long-term medication use and wishes to identify the root cause of her symptoms.    In July 2024, a CT scan of the abdomen and pelvis was performed, which did not show any concerning findings. A mammogram done in August 2024 revealed a benign-appearing node with no morphologically abnormal lymph node. An ultrasound performed in August 2024 showed a circumcised anechoic cyst in the right breast, correlating with the mammographic finding, with no evidence of any solid mass. A biopsy done in October 2024 confirmed a benign lymph node, negative  for malignancy. Thyroid levels and a chemistry panel drawn were unremarkable. A CBC done on 08/01/2023 showed neutrophils within the normal range, although they have been high previously.    She has a history of a rapidly enlarging breast lump during her third year of college, which was subsequently excised and identified as an enlarged lymph node. She is not a smoker. She is currently experiencing hot flashes and is uncertain about her menopausal status. She has undergone a total hysterectomy but retains her ovaries.    PAST SURGICAL HISTORY:  Total hysterectomy.  Excised breast lump during college.    SOCIAL HISTORY  She does not smoke. She is a stay-at-home mom.    FAMILY HISTORY  Her mother had lupus and Raynaud's disease.    Interval history:    The patient  presents for evaluation of elevated white blood cell count, lymph node mass, and fatty liver.    Three weeks ago, a CT chest scan revealed no acute lung process but identified a fatty liver, a small hiatal hernia, a previous healed left-sided rib fracture, mild scoliosis, and mild asymmetry in the axillary tail of the breast. Subsequently, a mammogram and ultrasound were performed, which showed a well-circumscribed mildly lobulated hypoechoic mass involving some lymph node cortex, measuring 6.5 cm. A biopsy of the mass confirmed it as a benign lymph node, negative for malignancy.    She reports persistent pain, which she attributes to inflammation. Despite a regular intake of fruits and vegetables, she perceives a deficiency in her diet and expresses interest in incorporating tart cherry into her diet as a potential anti-inflammatory measure. Recent lab work showed slightly elevated neutrophils and borderline low potassium. The C-reactive protein marker of inflammation, previously high in 03/2023, is now within normal range. Other tests, including BCR-ABL and ANA screen, returned normal results.    She has been experiencing discomfort due to a lymph node  mass, which was previously biopsied. She was informed of the presence of additional lymph nodes and a potential lipoma. She also reports a lump in the head and neck area, which was evaluated via ultrasound. The lump is non-painful but occasionally protrudes slightly. She recalls an incident of severe pain in the area following a 4-hour drive to her daughter's residence, during which she maintained a specific arm position.    During this visit patient's allergy, social, medical, surgical history and medications were reviewed and updated.    Past Medical History:   Past Medical History:   Diagnosis Date    Arthritis     Breast cyst August 18    Bronchitis     Carpal tunnel syndrome 03/14/2022    Carpal tunnel syndrome on both sides     Colon polyp     Diabetes mellitus (HCC)     Fatigue     GERD (gastroesophageal reflux disease)     Glucose intolerance     History of blood transfusion 1998    History of diverticulitis     History of vitamin D deficiency     Hyperlipidemia     Hypertension     IBS (irritable bowel syndrome)     Insomnia     Migraine     Obesity     OSA on CPAP     PCOS (polycystic ovarian syndrome)     PONV (postoperative nausea and vomiting)     Thyroid disease     Type 2 diabetes mellitus without complication (HCC)     Under care of service provider 03/01/2022    pcp-[facility]-last visit aug 2023    Under care of service provider 03/01/2022    sleep disorder-dr [facility]-last visit nov 2023    Wears contact lenses      Past Surgical History:  Past Surgical History:   Procedure Laterality Date    BREAST BIOPSY  1992    BREAST CYST EXCISION Right     Age 72    BREAST SURGERY Right     benign cyst removed     CARPAL TUNNEL RELEASE Right 03/14/2022    CARPAL TUNNEL RELEASE performed by Merline Starr, MD at [facility] OR    CHOLECYSTECTOMY      COLONOSCOPY N/A 05/29/2019    COLONOSCOPY WITH BIOPSY performed by Oretha Birch, MD at [facility] OR    COLONOSCOPY N/A 10/02/2022  COLONOSCOPY  DIAGNOSTIC performed by Keane Passe, MD at [facility] ENDO    FINGER FRACTURE SURGERY  1990    FOOT FRACTURE SURGERY  2012    HERNIA REPAIR      umbilical    HYSTERECTOMY (CERVIX STATUS UNKNOWN)      HYSTERECTOMY, VAGINAL      pt has had 2 miscarriages, and a D+C also    TONSILLECTOMY      UPPER GASTROINTESTINAL ENDOSCOPY      US  BREAST BIOPSY W LOC DEVICE 1ST LESION RIGHT Right 01/26/2023    US  BREAST BIOPSY W LOC DEVICE 1ST LESION RIGHT 01/26/2023 [facility] CENTER     Patient Family Social History:  Family History   Problem Relation Age of Onset    Colon Cancer Mother         before age 35     Other Mother         lupus     Diabetes Mother     Dementia Mother     Other Father         thyroid disease    COPD Father     Lymphoma Paternal Grandmother 35    Breast Cancer Paternal Cousin 41     Social History     Socioeconomic History    Marital status: Married     Spouse name: None    Number of children: None    Years of education: None    Highest education level: None   Tobacco Use    Smoking status: Never    Smokeless tobacco: Never   Vaping Use    Vaping status: Never Used   Substance and Sexual Activity    Alcohol use: Never    Drug use: Never    Sexual activity: Yes     Partners: Male     Social Drivers of Health     Financial Resource Strain: Low Risk  (10/12/2022)    Overall Financial Resource Strain (CARDIA)     Difficulty of Paying Living Expenses: Not hard at all   Food Insecurity: No Food Insecurity (06/28/2023)    Hunger Vital Sign     Worried About Running Out of Food in the Last Year: Never true     Ran Out of Food in the Last Year: Never true   Transportation Needs: No Transportation Needs (06/28/2023)    PRAPARE - Therapist, art (Medical): No     Lack of Transportation (Non-Medical): No   Housing Stability: Low Risk  (06/28/2023)    Housing Stability Vital Sign     Unable to Pay for Housing in the Last Year: No     Number of Times Moved in the Last Year: 0     Homeless in the Last  Year: No     Current Medications:  Current Outpatient Medications   Medication Sig Dispense Refill    Elderberry 500 MG CAPS Take by mouth      vitamin C (ASCORBIC ACID) 500 MG tablet Take 1 tablet by mouth daily      rosuvastatin  (CRESTOR ) 10 MG tablet TAKE 1 TABLET BY MOUTH EVERY OTHER DAY 45 tablet 1    lisinopril  (PRINIVIL ;ZESTRIL ) 10 MG tablet TAKE 1 TABLET BY MOUTH EVERY DAY 90 tablet 1    spironolactone -hydroCHLOROthiazide (ALDACTAZIDE) 25-25 MG per tablet TAKE 1 TABLET BY MOUTH EVERY DAY 90 tablet 1    Semaglutide , 1 MG/DOSE, (OZEMPIC , 1 MG/DOSE,) 4 MG/3ML SOPN sc injection Inject 1 mg  into the skin every 7 days 9 Adjustable Dose Pre-filled Pen Syringe 1    levothyroxine  (SYNTHROID ) 75 MCG tablet TAKE 1 TABLET BY MOUTH EVERY DAY 90 tablet 1    SODIUM FLUORIDE 5000 SENSITIVE 1.1-5 % GEL       fluocinolone (DERMOTIC) 0.01 % OIL oil       Clobetasol Propionate 0.05 % SHAM WORK INTO A DAMPENED SCALP, LEAVE ON 5 MIN, WASH OFF. USE 2-3 TIMES WEEKLY AS NEEDED FOR FLARES      metroNIDAZOLE (METROGEL) 1 % gel APPLY ONCE A DAY TO FACE      triamcinolone  (KENALOG ) 0.1 % ointment Apply 1 Application topically 2 times daily      Solriamfetol HCl 150 MG TABS 150 mg      Zinc 100 MG TABS Take 1 tablet by mouth daily      vitamin D (D3-1000) 25 MCG (1000 UT) CAPS Take 1 capsule by mouth daily       No current facility-administered medications for this visit.     Allergies:   Patient has no known allergies.    Review of Systems:    Constitutional: No fever or chills. No night sweats, no weight loss   Eyes: No eye discharge, double vision, or eye pain   HEENT: negative for sore mouth, sore throat, hoarseness and voice change   Respiratory: negative for cough , sputum, dyspnea, wheezing, hemoptysis, chest pain   Cardiovascular: negative for chest pain, dyspnea, palpitations, orthopnea, PND   Gastrointestinal: negative for nausea, vomiting, diarrhea, constipation, abdominal pain, Dysphagia, hematemesis and hematochezia    Genitourinary: negative for frequency, dysuria, nocturia, urinary incontinence, and hematuria   Integument: negative for rash, skin lesions, bruises.   Hematologic/Lymphatic: negative for easy bruising, bleeding, lymphadenopathy, or petechiae   Endocrine: negative for heat or cold intolerance,weight changes, change in bowel habits and hair loss   Musculoskeletal: negative for myalgias, arthralgias, pain, joint swelling,and bone pain   Neurological: negative for headaches, dizziness, seizures, weakness, numbness    Physical Exam:  Vitals: BP 123/77 (BP Site: Left Upper Arm, Patient Position: Sitting, BP Cuff Size: Large Adult)   Pulse (!) 101   Temp 98.5 F (36.9 C) (Oral)   Resp 16   Wt 101.3 kg (223 lb 4.8 oz)   SpO2 97%   BMI 40.84 kg/m   General appearance - well appearing, no in pain or distress  Mental status - AAO X3  Eyes - pupils equal and reactive, extraocular eye movements intact  Mouth - mucous membranes moist, pharynx normal without lesions  Neck - supple, no significant adenopathy  Lymphatics - no palpable lymphadenopathy, no hepatosplenomegaly  Chest - clear to auscultation, no wheezes, rales or rhonchi, symmetric air entry  Heart - normal rate, regular rhythm, normal S1, S2, no murmurs  Abdomen - soft, nontender, nondistended, no masses or organomegaly  Neurological - alert, oriented, normal speech, no focal findings or movement disorder noted  Extremities - peripheral pulses normal, no pedal edema, no clubbing or cyanosis  Skin - normal coloration and turgor, no rashes, no suspicious skin lesions noted     DATA:    CBC:   No results for input(s): "WBC", "HGB", "PLT" in the last 72 hours.    BMP:    No results for input(s): "NA", "K", "CL", "CO2", "BUN", "CREATININE", "GLUCOSE" in the last 72 hours.    Hepatic:   No results for input(s): "AST", "ALT", "BILITOT", "ALKPHOS" in the last 72 hours.    Invalid input(s): "  ALB"    INR: No results for input(s): "INR" in the last 72  hours.  BJY:NWGNFAO input(s): "PTT"    MAM TOMO DIGITAL DIAGNOSTIC BILATERAL  Result Date: 09/14/2023  EXAMINATION: DIAGNOSTIC DIGITAL BILATERAL BREASTS MAMMOGRAM WITH TOMOSYNTHESIS; TARGETED ULTRASOUND OF THE RIGHT BREAST, 09/14/2023 9:22 am TECHNIQUE: Diagnostic mammography of the bilateral breasts was performed with tomosynthesis.  2D standard and 3D tomosynthesis combination imaging performed through both breasts.  Computer aided detection was utilized in the interpretation of this exam.; Targeted ultrasound of the right breast was performed. Views: Craniocaudal and mediolateral oblique, laterally exaggerated right craniocaudal COMPARISON: 26 January 2023, 28 November 2022 HISTORY: ORDERING SYSTEM PROVIDED HISTORY: Mass of axillary tail of right breast TECHNOLOGIST PROVIDED HISTORY: Is the patient pregnant?->No MAMMOGRAM FINDINGS: Density: The breasts are heterogeneously dense, which may obscure small masses. Triangular marker over the upper outer posterior right breast indicates area of palpable abnormality, as directed by the patient.  Again visualized is a isodense mass within right breast middle 3rd, 9 o'clock radial.  Again visualized are prominent fatty lymph nodes within both axilla, right greater than left.  Biopsy marking clip is present within the right axillary region. There are some scattered small calcifications.  No new or concerning masses, suspicious calcifications, foci of architectural distortion or significant interval changes. ULTRASOUND FINDINGS: Real-time sonographic evaluation of the right axilla was performed in the area of palpable abnormality, as directed by the patient.  This is in the 10 o'clock radial, 10 cm from the nipple.  There is a well-circumscribed mildly lobulated hypoechoic mass which appears to include some lymph node cortex. This mass measures 6.5 x 2.4 cm in the anti-radial plane as current measurement appears to contain lymph nodes with prominent fatty hila.  No suspicious  shadowing or or new masses are detected.     No mammographic evidence of malignancy in either breast. Right axilla with prominent lymph nodes containing large fatty hila and no cortical thickening.  Question if there is an adjacent lipoma. BIRADS: BIRADS - CATEGORY 2 Benign Findings.  Providing clinical course is benign, normal interval follow-up mammogram is recommended in 12 months. OVERALL ASSESSMENT - BENIGN The patient was encouraged (by the ultrasound technologist) to perform monthly self-breast examinations and report back to her clinical provider should any new palpable abnormalities develop or current palpable abnormality enlarge, harden, or tissues become matted/fixed. A letter of notification will be sent to the patient regarding the results. The [employer] recommends annual mammograms for women 40 years and older. Performing Facility: [facility] - [address]. [facility]le Her[facility] [address] [place] , [place]  [address] Phone: [phone]     US  BREAST LIMITED RIGHT  Result Date: 09/14/2023  EXAMINATION: DIAGNOSTIC DIGITAL BILATERAL BREASTS MAMMOGRAM WITH TOMOSYNTHESIS; TARGETED ULTRASOUND OF THE RIGHT BREAST, 09/14/2023 9:22 am TECHNIQUE: Diagnostic mammography of the bilateral breasts was performed with tomosynthesis.  2D standard and 3D tomosynthesis combination imaging performed through both breasts.  Computer aided detection was utilized in the interpretation of this exam.; Targeted ultrasound of the right breast was performed. Views: Craniocaudal and mediolateral oblique, laterally exaggerated right craniocaudal COMPARISON: 26 January 2023, 28 November 2022 HISTORY: ORDERING SYSTEM PROVIDED HISTORY: Mass of axillary tail of right breast TECHNOLOGIST PROVIDED HISTORY: Is the patient pregnant?->No MAMMOGRAM FINDINGS: Density: The breasts are heterogeneously dense, which may obscure small masses. Triangular marker over the upper outer posterior right breast indicates area of palpable  abnormality, as directed by the patient.  Again visualized is a isodense mass within  right breast middle 3rd, 9 o'clock radial.  Again visualized are prominent fatty lymph nodes within both axilla, right greater than left.  Biopsy marking clip is present within the right axillary region. There are some scattered small calcifications.  No new or concerning masses, suspicious calcifications, foci of architectural distortion or significant interval changes. ULTRASOUND FINDINGS: Real-time sonographic evaluation of the right axilla was performed in the area of palpable abnormality, as directed by the patient.  This is in the 10 o'clock radial, 10 cm from the nipple.  There is a well-circumscribed mildly lobulated hypoechoic mass which appears to include some lymph node cortex. This mass measures 6.5 x 2.4 cm in the anti-radial plane as current measurement appears to contain lymph nodes with prominent fatty hila.  No suspicious shadowing or or new masses are detected.     No mammographic evidence of malignancy in either breast. Right axilla with prominent lymph nodes containing large fatty hila and no cortical thickening.  Question if there is an adjacent lipoma. BIRADS: BIRADS - CATEGORY 2 Benign Findings.  Providing clinical course is benign, normal interval follow-up mammogram is recommended in 12 months. OVERALL ASSESSMENT - BENIGN The patient was encouraged (by the ultrasound technologist) to perform monthly self-breast examinations and report back to her clinical provider should any new palpable abnormalities develop or current palpable abnormality enlarge, harden, or tissues become matted/fixed. A letter of notification will be sent to the patient regarding the results. The [employer] recommends annual mammograms for women 40 years and older.     CT CHEST W CONTRAST  Result Date: 08/30/2023  EXAMINATION: CT OF THE CHEST WITH CONTRAST 08/25/2023 3:05 pm TECHNIQUE: CT of the chest was performed with  the administration of intravenous contrast. Multiplanar reformatted images are provided for review. Automated exposure control, iterative reconstruction, and/or weight based adjustment of the mA/kV was utilized to reduce the radiation dose to as low as reasonably achievable. COMPARISON: None HISTORY: ORDERING SYSTEM PROVIDED HISTORY: Mass of axillary tail of right breast TECHNOLOGIST PROVIDED HISTORY: STAT Creatinine as needed:->Yes lymphadenopathy Reason for Exam: lymphadenopathy, Mass of axillary tail of right breast, Neutrophilia Additional signs and symptoms: pt states painful mass in right breast that changes in size Relevant Medical/Surgical History: surgeries-lumpectomy right breast FINDINGS: Mediastinum: No acute aortic abnormality.  No mediastinal or hilar adenopathy noted.  Shotty pretracheal lymph node is present.  No cardiomegaly, pericardial effusion or epicardial adenopathy is seen. Lungs/pleura: No effusion, consolidation or extrapleural air is seen..  No suspicious nodules.  Left thoracic volume smaller than right due to old healed left-sided rib fractures.  Tracheobronchial tree is patent. Upper Abdomen: Hepatic steatosis is noted.  Gallbladder is surgically absent. Small hiatal hernia.  Other upper abdominal structures are grossly unremarkable. Soft Tissues/Bones: Patient has a scoliotic curvature with significant lower thoracic spondylosis and right anterolateral spurring.  No axillary adenopathy is noted.  Breast tissue demonstrates only mild asymmetry in the right axillary tail.     1. No acute cardiopulmonary process. 2. No suspicious pulmonary nodules. 3. Hepatic steatosis. 4. Small hiatal hernia. 5. Old healed left-sided rib fractures. 6. Scoliosis and spondylosis of the thoracic spine. 7. Mild asymmetry in the axillary tail of the breast. Correlate with mammography. RECOMMENDATIONS: Advise performance of diagnostic mammography and follow-up ultrasound as necessary.     Impression:  Right  breast mass  Leukocytosis with neutrophilia    Plan:  I had a detailed discussion with the patient and personally went over results of lab work-up imaging  studies and other relevant clinical data.  Reviewed workup for neutrophilia and leukocytosis which was unremarkable.  CRP within range.  BCR-ABL PCR negative.  Scans show hepatic steatosis.  I suspect patient leukocytosis likely reactive in nature.  No plan for bone marrow biopsy.  Recommend continued surveillance with repeat CBC in a year or sooner if clinically indicated  Imaging including CT chest ultrasound breast and mammogram results were reviewed.  Patient has asymmetry in the axillary tail of the breast.  However imaging does not show any aggressive/malignant features.  Mammogram and ultrasound was read as BI-RADS 2.  Continue annual screening mammograms.  I also discussed possibility of doing an MRI breast if the area of concern becomes more painful.  Patient will let us  know.  Answered patient's questions to the best of ability.  We will see patient back in office in a year with repeat screening mammogram or sooner if needed    Keyara Ent, MD    I spent a total of 35 minutes on the date of the service which included preparing to see the patient, face-to-face patient care, completing clinical documentation, obtaining and/or reviewing separately obtained history, performing a medically appropriate examination, counseling and educating the patient/family/caregiver, ordering medications, tests, or procedures, communicating with other HCPs (not separately reported), independently interpreting results (not separately reported), communicating results to the patient/family/caregiver and care coordination (not separately reported).  This note is created with the assistance of a speech recognition program.  While intending to generate a document that actually reflects the content of the visit, the document can still have some errors including those of syntax  and sound a like substitutions which may escape proof reading.  It such instances, actual meaning can be extrapolated by contextual diversion.

## 2023-09-21 NOTE — Patient Instructions (Signed)
 Rv in 1 yr with cbc and mamogram

## 2023-09-21 NOTE — Telephone Encounter (Signed)
 Instructions   from Dr. Alvina Axon, MD    Rv in 1 yr with cbc and mamogram     AVS placed in pink folder,  will call to schedule.  Pt voived understanding

## 2023-12-27 ENCOUNTER — Ambulatory Visit: Admit: 2023-12-27 | Discharge: 2023-12-27 | Payer: PRIVATE HEALTH INSURANCE | Primary: Family Medicine

## 2023-12-27 VITALS — BP 122/76 | HR 99 | Ht 62.0 in | Wt 221.0 lb

## 2023-12-27 DIAGNOSIS — J329 Chronic sinusitis, unspecified: Secondary | ICD-10-CM

## 2023-12-27 DIAGNOSIS — B9689 Other specified bacterial agents as the cause of diseases classified elsewhere: Principal | ICD-10-CM

## 2023-12-27 MED ORDER — AMOXICILLIN-POT CLAVULANATE 875-125 MG PO TABS
875-125 | ORAL_TABLET | Freq: Two times a day (BID) | ORAL | 0 refills | Status: AC
Start: 2023-12-27 — End: 2024-01-06

## 2023-12-27 MED ORDER — GUAIFENESIN ER 600 MG PO TB12
600 | ORAL_TABLET | Freq: Two times a day (BID) | ORAL | 0 refills | Status: AC
Start: 2023-12-27 — End: 2024-01-11

## 2023-12-27 MED ORDER — CETIRIZINE HCL 10 MG PO TABS
10 | ORAL_TABLET | Freq: Every day | ORAL | 0 refills | Status: AC
Start: 2023-12-27 — End: ?

## 2023-12-27 NOTE — Patient Instructions (Signed)
 Take medication as prescribed  Practice good hand hygiene  Increase fluids  May use tylenol / ibuprofen for pain/ fever  May use humidifier  May use antihistamines (zyrtec, claritin)  May use nasal spray Flonase   If symptoms worsen return or follow up with PCP

## 2023-12-27 NOTE — Progress Notes (Signed)
 [facility], [facility] [place] [facility]  [address]  [place] [address]    [facility], [facility] [place] [facility]  [address]  [place] [address]  Dept: [phone]    Catherine Forbes is a 53 y.o. female Established patient, who presents to the walk-in clinic today with conditions/complaints as noted below:    Chief Complaint   Patient presents with    Cough     Pt c/o cough and congestion that started about 5 weeks ago. Pt states that when this first started, she was in [place]  and thought it was mold in their hotel room. She got better after the first week, but it came back and is worsening. She has been experiencing fever, chills, cough, congestion, sore throat (she thinks it is from coughing), and green nasal discharge. The only sx that are remaining are the sore throat, cough, and chest congestion with yellow sputum.          HPI:     Patient presents today with cold symptoms. She states this started approx. 5 weeks ago. She states she did travel at this time. She states at first she was congested, chills, fatigue, cough. She states it somewhat went away but symptoms keep re-appearing. She states currently she has some congestion, ear fullness, cough, post nasal drip, sore throat, and chest congestion. She denies fatigue, fever at this time. She states she has not taken anything other than her normal supplements.        Past Medical History:   Diagnosis Date    Arthritis     Breast cyst August 18    Bronchitis     Carpal tunnel syndrome 03/14/2022    Carpal tunnel syndrome on both sides     Colon polyp     Diabetes mellitus (HCC)     Fatigue     GERD (gastroesophageal reflux disease)     Glucose intolerance     History of blood transfusion 1998    History of diverticulitis     History of vitamin D deficiency     Hyperlipidemia     Hypertension     IBS (irritable bowel syndrome)      Insomnia     Migraine     Obesity     OSA on CPAP     PCOS (polycystic ovarian syndrome)     PONV (postoperative nausea and vomiting)     Thyroid disease     Type 2 diabetes mellitus without complication (HCC)     Under care of service provider 03/01/2022    pcp-[facility]-[place]-last visit aug 2023    Under care of service provider 03/01/2022    sleep disorder-dr jacob-[place]-last visit nov 2023    Wears contact lenses        Current Outpatient Medications   Medication Sig Dispense Refill    amoxicillin -clavulanate (AUGMENTIN ) 875-125 MG per tablet Take 1 tablet by mouth 2 times daily for 10 days 20 tablet 0    guaiFENesin  (MUCINEX ) 600 MG extended release tablet Take 1 tablet by mouth 2 times daily for 15 days 30 tablet 0    cetirizine  (ZYRTEC ) 10 MG tablet Take 1 tablet by mouth daily 30 tablet 0    Elderberry 500 MG CAPS Take by mouth      vitamin C (ASCORBIC ACID) 500 MG tablet Take 1 tablet by mouth daily      rosuvastatin  (  CRESTOR ) 10 MG tablet TAKE 1 TABLET BY MOUTH EVERY OTHER DAY 45 tablet 1    lisinopril  (PRINIVIL ;ZESTRIL ) 10 MG tablet TAKE 1 TABLET BY MOUTH EVERY DAY 90 tablet 1    spironolactone -hydroCHLOROthiazide (ALDACTAZIDE) 25-25 MG per tablet TAKE 1 TABLET BY MOUTH EVERY DAY 90 tablet 1    levothyroxine  (SYNTHROID ) 75 MCG tablet TAKE 1 TABLET BY MOUTH EVERY DAY 90 tablet 1    fluocinolone (DERMOTIC) 0.01 % OIL oil       Clobetasol Propionate 0.05 % SHAM WORK INTO A DAMPENED SCALP, LEAVE ON 5 MIN, WASH OFF. USE 2-3 TIMES WEEKLY AS NEEDED FOR FLARES      metroNIDAZOLE (METROGEL) 1 % gel APPLY ONCE A DAY TO FACE      Solriamfetol HCl 150 MG TABS 150 mg      Zinc 100 MG TABS Take 1 tablet by mouth daily      vitamin D (D3-1000) 25 MCG (1000 UT) CAPS Take 1 capsule by mouth daily      Semaglutide , 1 MG/DOSE, (OZEMPIC , 1 MG/DOSE,) 4 MG/3ML SOPN sc injection Inject 1 mg into the skin every 7 days (Patient not taking: Reported on 12/27/2023) 9 Adjustable Dose Pre-filled Pen Syringe 1    SODIUM FLUORIDE 5000  SENSITIVE 1.1-5 % GEL  (Patient not taking: Reported on 12/27/2023)      triamcinolone  (KENALOG ) 0.1 % ointment Apply 1 Application topically 2 times daily (Patient not taking: Reported on 12/27/2023)       No current facility-administered medications for this visit.       No Known Allergies    Review of Systems:     Review of Systems   Constitutional:  Negative for activity change, chills, fatigue and fever.   HENT:  Positive for congestion, postnasal drip, rhinorrhea, sore throat and voice change. Negative for ear discharge, sinus pressure and trouble swallowing.    Respiratory:  Positive for cough. Negative for chest tightness, shortness of breath and wheezing.    Cardiovascular:  Negative for chest pain.   Gastrointestinal:  Negative for abdominal pain, diarrhea, nausea and vomiting.   Skin:  Negative for rash.   Neurological:  Negative for dizziness and headaches.       Physical Exam:      BP 122/76 (BP Site: Left Upper Arm, Patient Position: Sitting)   Pulse 99   Ht 1.575 m (5' 2)   Wt 100.2 kg (221 lb)   SpO2 98%   BMI 40.42 kg/m     Physical Exam  Constitutional:       General: She is not in acute distress.     Appearance: Normal appearance. She is not ill-appearing.   HENT:      Head: Normocephalic.      Right Ear: Tympanic membrane and ear canal normal.      Left Ear: Tympanic membrane and ear canal normal.      Nose: Nose normal.      Mouth/Throat:      Mouth: Mucous membranes are moist.      Pharynx: Posterior oropharyngeal erythema present. No oropharyngeal exudate.   Eyes:      Extraocular Movements: Extraocular movements intact.      Conjunctiva/sclera: Conjunctivae normal.      Pupils: Pupils are equal, round, and reactive to light.   Cardiovascular:      Rate and Rhythm: Normal rate and regular rhythm.      Heart sounds: Normal heart sounds.   Pulmonary:      Effort:  Pulmonary effort is normal.      Breath sounds: Normal breath sounds.   Abdominal:      General: Abdomen is flat. Bowel sounds are  normal.      Palpations: Abdomen is soft.   Lymphadenopathy:      Cervical: No cervical adenopathy.   Skin:     General: Skin is warm and dry.   Neurological:      General: No focal deficit present.      Mental Status: She is alert and oriented to person, place, and time.   Psychiatric:         Mood and Affect: Mood normal.         Behavior: Behavior normal.         Plan:     Assessment & Plan     1. Bacterial sinusitis  -     amoxicillin -clavulanate (AUGMENTIN ) 875-125 MG per tablet; Take 1 tablet by mouth 2 times daily for 10 days, Disp-20 tablet, R-0Normal  -     guaiFENesin  (MUCINEX ) 600 MG extended release tablet; Take 1 tablet by mouth 2 times daily for 15 days, Disp-30 tablet, R-0Normal  -     cetirizine  (ZYRTEC ) 10 MG tablet; Take 1 tablet by mouth daily, Disp-30 tablet, R-0Normal     -Due to re-occurring and length of symptoms, will treat as bacterial, take augmentin  as directed  Take medication as prescribed  Practice good hand hygiene  Increase fluids  May use tylenol / ibuprofen for pain/ fever  May use humidifier  May use antihistamines (zyrtec , claritin)  May use nasal spray Flonase  If symptoms worsen return or follow up with PCP   -ER red flags discussed, patient verbalizes understanding     Follow Up Instructions:      Return if symptoms worsen or fail to improve.    Orders Placed This Encounter   Medications    amoxicillin -clavulanate (AUGMENTIN ) 875-125 MG per tablet     Sig: Take 1 tablet by mouth 2 times daily for 10 days     Dispense:  20 tablet     Refill:  0    guaiFENesin  (MUCINEX ) 600 MG extended release tablet     Sig: Take 1 tablet by mouth 2 times daily for 15 days     Dispense:  30 tablet     Refill:  0    cetirizine  (ZYRTEC ) 10 MG tablet     Sig: Take 1 tablet by mouth daily     Dispense:  30 tablet     Refill:  0               Discussed exam, POCT findings, plan of care, and follow-up at length with patient and/or their caregiver.  Reviewed all prescribed and recommended medications,  administration and side effects. Encouraged patient to follow up with PCP or return to the clinic for no improvement and or worsening of symptoms. All questions were addressed and answered with verbalization of understanding. The patient and/or the caregiver was agreeable with the plan.       Electronically signed by Mitzie Mano, APRN - CNPon 12/27/2023 at 11:20 AM

## 2024-01-08 ENCOUNTER — Encounter

## 2024-01-08 MED ORDER — FLUCONAZOLE 150 MG PO TABS
150 | ORAL_TABLET | ORAL | 0 refills | 5.50000 days | Status: AC
Start: 2024-01-08 — End: 2024-01-14

## 2024-01-17 ENCOUNTER — Encounter

## 2024-01-17 MED ORDER — SPIRONOLACTONE-HCTZ 25-25 MG PO TABS
25-25 | ORAL_TABLET | Freq: Every day | ORAL | 1 refills | 60.00000 days | Status: DC
Start: 2024-01-17 — End: 2024-05-19

## 2024-01-17 NOTE — Telephone Encounter (Signed)
 Last 3/6, next 10/1

## 2024-01-23 ENCOUNTER — Ambulatory Visit
Admit: 2024-01-23 | Discharge: 2024-01-23 | Payer: PRIVATE HEALTH INSURANCE | Attending: Family Medicine | Primary: Family Medicine

## 2024-01-23 NOTE — Progress Notes (Signed)
 [facility]  [address]   [place], [place] [address]  Phone: [phone]       Name: Catherine Forbes  DOB: 01-14-1971     Chief Complaint:    Catherine Forbes is a 53 y.o. year old female who presents today for   Chief Complaint   Patient presents with    Fatigue     Patient has been dealing with a lot of fatigue as well as some stiffness in her joints.    Discuss Medications     Patient is asking if she can switch from ozempic  to Mounjaro  if possible. She notes significant nausea with the Ozempic .    Flu Vaccine     Declined.       History of Present Illness:      Subjective   History of Present Illness  The patient is a 53 year old female who presents for follow up.    She reports experiencing stiffness in her ankles and feet during the night, which she suspects may be due to weight gain. While her ankles are not swollen today, they often are. She does not currently take glucosamine or chondroitin supplements.    She has been on semaglutide  for several years for her DM2, but it has been causing nausea. She discontinued its use approximately 3 to 4 weeks ago. Previously, she tried Wegovy , but it increased her appetite.    She is interested in having her blood work done, including a CBC with white blood count, as she was ill for 6 to 7 weeks. She had a lingering cough and came in for a walk-in visit 6 weeks ago for a cold. She has been taking vitamin D with K supplements.    Occupation: Recruitment consultant at [employer]    Medications:    Outpatient Medications Prior to Visit   Medication Sig Dispense Refill    spironolactone -hydroCHLOROthiazide (ALDACTAZIDE) 25-25 MG per tablet TAKE 1 TABLET BY MOUTH EVERY DAY 90 tablet 1    Elderberry 500 MG CAPS Take by mouth      vitamin C (ASCORBIC ACID) 500 MG tablet Take 1 tablet by mouth daily      rosuvastatin  (CRESTOR ) 10 MG tablet TAKE 1 TABLET BY MOUTH EVERY OTHER DAY 45 tablet 1    lisinopril  (PRINIVIL ;ZESTRIL ) 10 MG tablet TAKE 1 TABLET BY MOUTH  EVERY DAY 90 tablet 1    levothyroxine  (SYNTHROID ) 75 MCG tablet TAKE 1 TABLET BY MOUTH EVERY DAY 90 tablet 1    SODIUM FLUORIDE 5000 SENSITIVE 1.1-5 % GEL       fluocinolone (DERMOTIC) 0.01 % OIL oil       Clobetasol Propionate 0.05 % SHAM WORK INTO A DAMPENED SCALP, LEAVE ON 5 MIN, WASH OFF. USE 2-3 TIMES WEEKLY AS NEEDED FOR FLARES      metroNIDAZOLE (METROGEL) 1 % gel APPLY ONCE A DAY TO FACE      triamcinolone  (KENALOG ) 0.1 % ointment Apply 1 Application topically 2 times daily      Solriamfetol HCl 150 MG TABS 150 mg      Zinc 100 MG TABS Take 1 tablet by mouth daily      vitamin D (D3-1000) 25 MCG (1000 UT) CAPS Take 1 capsule by mouth daily      cetirizine  (ZYRTEC ) 10 MG tablet Take 1 tablet by mouth daily (Patient not taking: Reported on 01/23/2024) 30 tablet 0    Semaglutide , 1 MG/DOSE, (OZEMPIC , 1 MG/DOSE,) 4 MG/3ML SOPN sc injection Inject 1 mg  into the skin every 7 days (Patient not taking: Reported on 01/23/2024) 9 Adjustable Dose Pre-filled Pen Syringe 1     No facility-administered medications prior to visit.       Review of Systems:     Review of Systems     Physical Exam:     Vitals:  BP 120/84   Pulse (!) 124   Ht 1.575 m (5' 2.01)   Wt 103.4 kg (228 lb)   SpO2 95%   BMI 41.69 kg/m  Body mass index is 41.69 kg/m.    Physical Exam  Vitals and nursing note reviewed.   Constitutional:       Appearance: Normal appearance.   Cardiovascular:      Rate and Rhythm: Normal rate and regular rhythm.      Heart sounds: Normal heart sounds.   Pulmonary:      Effort: Pulmonary effort is normal.      Breath sounds: Normal breath sounds.   Neurological:      General: No focal deficit present.      Mental Status: She is alert.   Psychiatric:         Mood and Affect: Mood normal.         Behavior: Behavior normal.         Assessment:      Diagnosis Orders   1. Type 2 diabetes mellitus without complication, without long-term current use of insulin (HCC)  Hemoglobin A1C    Comprehensive Metabolic Panel    CBC     Albumin/Creatinine Ratio, Urine      2. Other specified hypothyroidism  TSH    CBC      3. Vitamin D deficiency  Vitamin D 25 Hydroxy      4. Arthritis                  Plan:        Assessment & Plan         1. Type 2 diabetes mellitus without complication, without long-term current use of insulin (HCC) - will get labs. Has been off of ozempic  for 3-4 weeks due to nausea. Will start mounjaro  assuming A1c shows this medication is still necessary.     Orders:  -     Hemoglobin A1C; Future  -     Comprehensive Metabolic Panel; Future  -     CBC; Future  -     Albumin/Creatinine Ratio, Urine; Future  2. Other specified hypothyroidism - labs ordered. Continue current dose of levothyroxine  unless labs warrant a change.   -     TSH; Future  -     CBC; Future  3. Vitamin D deficiency  -     Vitamin D 25 Hydroxy; Future  4. Arthritis - glucosamine/chondroitin recommended       MDM: Moderate MDM - 2 or more chronic conditions with prescription medication management as described above.        Return in about 3 months (around 04/24/2024) for DM, with A1c.    No orders of the defined types were placed in this encounter.    Orders Placed This Encounter   Procedures    Hemoglobin A1C     Standing Status:   Future     Number of Occurrences:   1     Expected Date:   01/23/2024     Expiration Date:   01/22/2025    TSH     Standing Status:   Future  Number of Occurrences:   1     Expected Date:   01/23/2024     Expiration Date:   01/22/2025    Comprehensive Metabolic Panel     Standing Status:   Future     Number of Occurrences:   1     Expected Date:   01/23/2024     Expiration Date:   01/22/2025    CBC     Standing Status:   Future     Number of Occurrences:   1     Expected Date:   01/23/2024     Expiration Date:   01/22/2025    Albumin/Creatinine Ratio, Urine     Standing Status:   Future     Number of Occurrences:   1     Expected Date:   01/23/2024     Expiration Date:   01/22/2025    Vitamin D 25 Hydroxy     Standing Status:   Future      Number of Occurrences:   1     Expected Date:   01/23/2024     Expiration Date:   01/22/2025         I reviewed the above assessment and plan with Catherine Forbes.  Questions were answered and it appears that the Kina has a good understanding of the visit today.    The patient (or guardian, if applicable) and other individuals in attendance with the patient were advised that Artificial Intelligence will be utilized during this visit to record, process the conversation to generate a clinical note and to support improvement of the AI technology. The patient (or guardian, if applicable) and other individuals in attendance at the appointment consented to the use of AI, including the recording.      An electronic signature was used to authenticate this note.    --Silvano GORMAN Salt, DO     Electronically signed by Silvano GORMAN Salt, DO on 01/23/2024 at 4:37 PM

## 2024-01-23 NOTE — Patient Instructions (Signed)
 Glucosamine and chondroitin

## 2024-01-24 ENCOUNTER — Encounter

## 2024-01-24 LAB — VITAMIN D 25 HYDROXY: Vit D, 25-Hydroxy: 59.1 ng/mL (ref 30.0–100.0)

## 2024-01-24 LAB — COMPREHENSIVE METABOLIC PANEL
ALT: 53 U/L — ABNORMAL HIGH (ref 10–35)
AST: 35 U/L (ref 10–35)
Albumin/Globulin Ratio: 1.2 (ref 1.0–2.5)
Albumin: 4.3 g/dL (ref 3.5–5.2)
Alkaline Phosphatase: 77 U/L (ref 35–104)
Anion Gap: 14 mmol/L (ref 9–16)
BUN: 15 mg/dL (ref 6–20)
CO2: 24 mmol/L (ref 20–31)
Calcium: 10.4 mg/dL (ref 8.6–10.4)
Chloride: 97 mmol/L — ABNORMAL LOW (ref 98–107)
Creatinine: 0.9 mg/dL (ref 0.6–0.9)
Est, Glom Filt Rate: 76 mL/min/1.73m2 (ref >60)
Glucose: 327 mg/dL — ABNORMAL HIGH (ref 74–99)
Potassium: 3.9 mmol/L (ref 3.7–5.3)
Sodium: 135 mmol/L — ABNORMAL LOW (ref 136–145)
Total Bilirubin: 0.3 mg/dL (ref 0.0–1.2)
Total Protein: 7.8 g/dL (ref 6.6–8.7)

## 2024-01-24 LAB — CBC
Hematocrit: 43 % (ref 36.3–47.1)
Hemoglobin: 14.4 g/dL (ref 11.9–15.1)
MCH: 30.8 pg (ref 25.2–33.5)
MCHC: 33.5 g/dL (ref 28.4–34.8)
MCV: 92.1 fL (ref 82.6–102.9)
MPV: 10.2 fL (ref 8.1–13.5)
NRBC Automated: 0 /100{WBCs}
Platelets: 323 k/uL (ref 138–453)
RBC: 4.67 m/uL (ref 3.95–5.11)
RDW: 12.7 % (ref 11.8–14.4)
WBC: 12 k/uL — ABNORMAL HIGH (ref 3.5–11.3)

## 2024-01-24 LAB — HEMOGLOBIN A1C
Estimated Avg Glucose: 203 mg/dL
Hemoglobin A1C: 8.7 % — ABNORMAL HIGH (ref 4.0–6.0)

## 2024-01-24 LAB — ALBUMIN/CREATININE RATIO, URINE
Albumin Urine: 43 mg/L — ABNORMAL HIGH (ref 0–20)
Creatinine, Ur: 83.1 mg/dL (ref 28.0–217.0)
Microalb/Crt. Ratio: 52 ug/mg{creat} — ABNORMAL HIGH (ref 0.0–25.0)

## 2024-01-24 LAB — TSH: TSH: 0.89 u[IU]/mL (ref 0.27–4.20)

## 2024-01-24 MED ORDER — TIRZEPATIDE 2.5 MG/0.5ML SC SOAJ
2.5 | SUBCUTANEOUS | 0 refills | 28.00000 days | Status: DC
Start: 2024-01-24 — End: 2024-04-10

## 2024-01-24 NOTE — Telephone Encounter (Signed)
 Pt.notified

## 2024-02-10 ENCOUNTER — Encounter

## 2024-02-11 MED ORDER — ROSUVASTATIN CALCIUM 10 MG PO TABS
10 | ORAL_TABLET | ORAL | 1 refills | Status: AC
Start: 2024-02-11 — End: 2024-08-09

## 2024-02-11 MED ORDER — LISINOPRIL 10 MG PO TABS
10 | ORAL_TABLET | Freq: Every day | ORAL | 1 refills | Status: AC
Start: 2024-02-11 — End: ?

## 2024-02-11 NOTE — Telephone Encounter (Signed)
"  Last Visit Date: 01/23/2024  Next Visit Date: 04/2024  Requested Prescriptions     Pending Prescriptions Disp Refills    lisinopril  (PRINIVIL ;ZESTRIL ) 10 MG tablet [Pharmacy Med Name: LISINOPRIL  10 MG TABLET] 90 tablet 1     Sig: TAKE 1 TABLET BY MOUTH EVERY DAY    rosuvastatin  (CRESTOR ) 10 MG tablet [Pharmacy Med Name: ROSUVASTATIN  CALCIUM  10 MG TAB] 45 tablet 1     Sig: TAKE 1 TABLET BY MOUTH EVERY OTHER DAY      "

## 2024-03-18 NOTE — Telephone Encounter (Signed)
"  PA for Mounjaro  submitted via cover my meds submitted today  "

## 2024-03-18 NOTE — Telephone Encounter (Signed)
"  Questions answered and office documents submitted  "

## 2024-03-21 NOTE — Telephone Encounter (Signed)
 Mounjaro  approved.

## 2024-04-10 ENCOUNTER — Encounter

## 2024-04-10 MED ORDER — MOUNJARO 2.5 MG/0.5ML SC SOAJ
2.5 | SUBCUTANEOUS | 0 refills | 28.00000 days | Status: DC
Start: 2024-04-10 — End: 2024-04-30

## 2024-04-10 NOTE — Telephone Encounter (Signed)
"  Last Visit Date: 01/23/2024   Next Visit Date: 04/30/2024     "

## 2024-04-23 ENCOUNTER — Inpatient Hospital Stay: Payer: PRIVATE HEALTH INSURANCE | Primary: Family Medicine

## 2024-04-23 DIAGNOSIS — E119 Type 2 diabetes mellitus without complications: Principal | ICD-10-CM

## 2024-04-24 LAB — CBC WITH AUTO DIFFERENTIAL
Basophils %: 1 % (ref 0–2)
Basophils Absolute: 0.06 k/uL (ref 0.00–0.20)
Eosinophils %: 3 % (ref 1–4)
Eosinophils Absolute: 0.26 k/uL (ref 0.00–0.44)
Hematocrit: 42.7 % (ref 36.3–47.1)
Hemoglobin: 13.9 g/dL (ref 11.9–15.1)
Immature Granulocytes %: 0 %
Immature Granulocytes Absolute: 0.04 k/uL (ref 0.00–0.30)
Lymphocytes %: 17 % — ABNORMAL LOW (ref 24–43)
Lymphocytes Absolute: 1.56 k/uL (ref 1.10–3.70)
MCH: 30.2 pg (ref 25.2–33.5)
MCHC: 32.6 g/dL (ref 28.4–34.8)
MCV: 92.8 fL (ref 82.6–102.9)
MPV: 10.2 fL (ref 8.1–13.5)
Monocytes %: 7 % (ref 3–12)
Monocytes Absolute: 0.66 k/uL (ref 0.10–1.20)
NRBC Automated: 0 /100{WBCs}
Neutrophils %: 72 % — ABNORMAL HIGH (ref 36–65)
Neutrophils Absolute: 6.73 k/uL (ref 1.50–8.10)
Platelets: 315 k/uL (ref 138–453)
RBC: 4.6 m/uL (ref 3.95–5.11)
RDW: 12.4 % (ref 11.8–14.4)
WBC: 9.3 k/uL (ref 3.5–11.3)

## 2024-04-24 LAB — COMPREHENSIVE METABOLIC PANEL
ALT: 49 U/L — ABNORMAL HIGH (ref 10–35)
AST: 25 U/L (ref 10–35)
Albumin/Globulin Ratio: 1.5 (ref 1.0–2.5)
Albumin: 4.3 g/dL (ref 3.5–5.2)
Alkaline Phosphatase: 78 U/L (ref 35–104)
Anion Gap: 9 mmol/L (ref 9–16)
BUN: 10 mg/dL (ref 6–20)
CO2: 29 mmol/L (ref 20–31)
Calcium: 9.6 mg/dL (ref 8.6–10.4)
Chloride: 101 mmol/L (ref 98–107)
Creatinine: 0.6 mg/dL (ref 0.6–0.9)
Est, Glom Filt Rate: >90 mL/min/1.73m2 (ref >60)
Glucose: 236 mg/dL — ABNORMAL HIGH (ref 74–99)
Potassium: 4.6 mmol/L (ref 3.7–5.3)
Sodium: 139 mmol/L (ref 136–145)
Total Bilirubin: 0.3 mg/dL (ref 0.0–1.2)
Total Protein: 7.1 g/dL (ref 6.6–8.7)

## 2024-04-24 LAB — HEMOGLOBIN A1C
Estimated Avg Glucose: 229 mg/dL
Hemoglobin A1C: 9.6 % — ABNORMAL HIGH (ref 4.0–6.0)

## 2024-04-30 ENCOUNTER — Ambulatory Visit
Admit: 2024-04-30 | Discharge: 2024-04-30 | Payer: PRIVATE HEALTH INSURANCE | Attending: Family Medicine | Primary: Family Medicine

## 2024-04-30 VITALS — BP 124/82 | HR 98 | Ht 62.01 in | Wt 221.0 lb

## 2024-04-30 DIAGNOSIS — E119 Type 2 diabetes mellitus without complications: Principal | ICD-10-CM

## 2024-04-30 MED ORDER — TIRZEPATIDE 5 MG/0.5ML SC SOAJ
5 | SUBCUTANEOUS | 2 refills | 28.00000 days | Status: AC
Start: 2024-04-30 — End: ?

## 2024-04-30 NOTE — Progress Notes (Signed)
 [facility]  [address]   [place], [place] [address]  Phone: [phone]       Name: Catherine Forbes  DOB: 09-06-1970     Chief Complaint:    Catherine Forbes is a 54 y.o. year old female who presents today for   Chief Complaint   Patient presents with    Diabetes     Patient has started Mounjaro  she took her fourth shot this week; She denies any side effects at this time. Her Last A1C was 9.6 on April 23, 2024.       History of Present Illness:      Subjective   History of Present Illness  The patient is a 54 year old female who presents for follow-up on her diabetes.    She initiated Mounjaro  therapy approximately 3 to 4 weeks ago, reporting no adverse effects. She has observed a decrease in her appetite during the initial 2 to 3 days of the medication cycle, after which her appetite returns to baseline. She expresses a desire to increase the dosage of Mounjaro . Her dietary habits were suboptimal during the holiday season due to frequent dining out with her children. She does not require any additional medication refills at this time.    She has been experiencing stiffness, which she attributes to her weight. She has attempted to manage this with glucosamine and chondroitin, but these have not provided relief. Turmeric exacerbated her symptoms, while tart cherry had no noticeable effect. She has been sleeping in a recliner for the past 2 to 3 months due to pain. She has a history of back issues related to scoliosis and sciatica. Although her condition has generally improved, she occasionally experiences immobility.    She has an upcoming appointment with her dermatologist in a couple of months. She has noticed a lesion that has been present for a couple of months. Initially, she could squeeze it once a month and a little white substance would come out, but now there are three of them. Additionally, she has another lesion that appears as dry skin with a little red spot. She is concerned about  these lesions and is considering whether she should call her dermatologist sooner.      Medications:    Outpatient Medications Prior to Visit   Medication Sig Dispense Refill    lisinopril  (PRINIVIL ;ZESTRIL ) 10 MG tablet TAKE 1 TABLET BY MOUTH EVERY DAY 90 tablet 1    rosuvastatin  (CRESTOR ) 10 MG tablet TAKE 1 TABLET BY MOUTH EVERY OTHER DAY 45 tablet 1    spironolactone -hydroCHLOROthiazide (ALDACTAZIDE) 25-25 MG per tablet TAKE 1 TABLET BY MOUTH EVERY DAY 90 tablet 1    Elderberry 500 MG CAPS Take by mouth      vitamin C (ASCORBIC ACID) 500 MG tablet Take 1 tablet by mouth daily      levothyroxine  (SYNTHROID ) 75 MCG tablet TAKE 1 TABLET BY MOUTH EVERY DAY 90 tablet 1    SODIUM FLUORIDE 5000 SENSITIVE 1.1-5 % GEL       fluocinolone (DERMOTIC) 0.01 % OIL oil  (Patient taking differently: PRN)      Clobetasol Propionate 0.05 % SHAM WORK INTO A DAMPENED SCALP, LEAVE ON 5 MIN, WASH OFF. USE 2-3 TIMES WEEKLY AS NEEDED FOR FLARES      metroNIDAZOLE (METROGEL) 1 % gel APPLY ONCE A DAY TO FACE      triamcinolone  (KENALOG ) 0.1 % ointment Apply 1 Application topically 2 times daily      Solriamfetol HCl 150 MG  TABS 150 mg      Zinc 100 MG TABS Take 1 tablet by mouth daily      vitamin D (D3-1000) 25 MCG (1000 UT) CAPS Take 1 capsule by mouth daily      Tirzepatide  (MOUNJARO ) 2.5 MG/0.5ML SOAJ pen INJECT 2.5 MG SUBCUTANEOUSLY EVERY 7 DAYS 2 mL 0    cetirizine  (ZYRTEC ) 10 MG tablet Take 1 tablet by mouth daily (Patient not taking: Reported on 04/30/2024) 30 tablet 0    Semaglutide , 1 MG/DOSE, (OZEMPIC , 1 MG/DOSE,) 4 MG/3ML SOPN sc injection Inject 1 mg into the skin every 7 days (Patient not taking: Reported on 04/30/2024) 9 Adjustable Dose Pre-filled Pen Syringe 1     No facility-administered medications prior to visit.       Review of Systems:     Review of Systems     Physical Exam:     Vitals:  BP 124/82   Pulse 98   Ht 1.575 m (5' 2.01)   Wt 100.2 kg (221 lb)   SpO2 99%   BMI 40.41 kg/m  Body mass index is 40.41 kg/m.     Physical Exam  Vitals and nursing note reviewed.   Constitutional:       Appearance: Normal appearance.   Cardiovascular:      Rate and Rhythm: Normal rate and regular rhythm.      Heart sounds: Normal heart sounds.   Pulmonary:      Effort: Pulmonary effort is normal.      Breath sounds: Normal breath sounds.   Skin:     Comments: AK on right side of face - lateral to eye. Also on left side of chest wall    Neurological:      General: No focal deficit present.      Mental Status: She is alert.   Psychiatric:         Mood and Affect: Mood normal.         Behavior: Behavior normal.         Assessment:      Diagnosis Orders   1. Type 2 diabetes mellitus without complication, without long-term current use of insulin (HCC)  Tirzepatide  (MOUNJARO ) 5 MG/0.5ML SOAJ pen      2. Chronic bilateral low back pain with right-sided sciatica        3. Acral keratosis                  Plan:             1. Type 2 diabetes mellitus without complication, without long-term current use of insulin (HCC)  - Her A1c level has increased to 9.6, likely due to the delayed initiation of Mounjaro .  - She reports no side effects from the medication but notes that it only curbs her appetite for the first two to three days.  - The dosage of Mounjaro  will be increased to 5 mg.  - She is advised to contact the office if she wishes to further increase the dosage after completing the current prescription in a month.  Orders:  -     Tirzepatide  (MOUNJARO ) 5 MG/0.5ML SOAJ pen; Inject 1 pen  into the skin every 7 days, Disp-2 mL, R-2Normal  2. Chronic bilateral low back pain with right-sided sciatica - multifactorial. I did give information about menopause and diet modifications that can help in this stage of life. Also discussed that weight loss will likely benefit her symptoms.   3. Acral keratosis - encouraged her  to contact derm, likely this lesions will need cryotherapy.       MDM: Moderate MDM - 2 or more chronic conditions with prescription  medication management as described above.        Return in about 3 months (around 07/29/2024) for DM, with A1c.    Orders Placed This Encounter   Medications    Tirzepatide  (MOUNJARO ) 5 MG/0.5ML SOAJ pen     Sig: Inject 1 pen  into the skin every 7 days     Dispense:  2 mL     Refill:  2     No orders of the defined types were placed in this encounter.        I reviewed the above assessment and plan with Catherine Forbes.  Questions were answered and it appears that the Catherine Forbes has a good understanding of the visit today.    The patient (or guardian, if applicable) and other individuals in attendance with the patient were advised that Artificial Intelligence will be utilized during this visit to record, process the conversation to generate a clinical note and to support improvement of the AI technology. The patient (or guardian, if applicable) and other individuals in attendance at the appointment consented to the use of AI, including the recording.      An electronic signature was used to authenticate this note.    --Silvano GORMAN Salt, DO     Electronically signed by Silvano GORMAN Salt, DO on 04/30/2024 at 9:42 AM

## 2024-04-30 NOTE — Patient Instructions (Signed)
 "Menopause information (Adapted from information from Dr. Jearline and Dr. Lani)      Labs that GYN will request prior to consultation: fasting lipid profile, CBC, CMP with fasting glucose or A1c, Vitamin D and TSH    Listen to the podcasts below   [email or web address]   2.   [email or web address].apple.com/us /[email or web address]/the-wisdom-[email or web address]-the-[email or web address]-brain-[email or web address]   3.   [email or web address]     Watch this brief news clip about perimenopause reportnation.[email or web address]     If you can access, watch The M Factor documentary on PBS    Please see below links for info from The Menopause Society:   Deciding about hormone therapy: [email or web address]   Misinformation Surrounding Hormone therapy: Embassyblog.es.pdf   Menopause and Depression: giggleclubs.co.[email or web address].pdf   Non-hormone treatment: [email or web address]   Genitourinary Syndrome of menopause: Naturalstorm.nl.pdf   Bio identical hormone therapy: ellustrate.fi.pdf     Other contemporary menopause resources:   toyshower.it   golfcleaners.co.uk    https://www.ahajournals.org/doi/10.1161/CIR.0000000000000912  https://estrogenmatters.com/ easternvillas.no   https://www.[email or web address] https://blackgirlsguidetosurvivingmenopause.com   adminsales.nl https://thepauselife.com     The Musculoskeletal Syndrome of Menopause by Dr. Wonda Silvan: https://www.tandfonline.com/doi/full/10.1080/13697137.2024.2380363#abstract   A Contemporary View of Menopausal Hormone Therapy by Dr. Heron Lipps: toledoinfo.at     OPTIMIZING NUTRITION/LIFESTYLE CHANGES AND WEIGHT GAIN/BODY CHANGES   As we get older, women may notice that staying at their usual weight becomes harder. It's common for weight gain to start a few years before menopause, during the time known as perimenopause which commonly starts at ages 54-44. For some it can start in their 30's. Both men and women produce fewer hormones once they hit 30. Estrogen - the hormone that controls a woman's monthly cycle - begins to drop around then and further decreases at 35. Weight gain often continues during this time at about the rate of 1.5 pounds each year as a woman goes through her 51s. In addition, hormonal changes of menopause tend to make it more likely that women will gain weight around the abdomen, rather than the hips and thighs. But hormonal changes are not the only cause of weight gain. This is often related to unavoidable changes of aging, as well as lifestyle and genetic factors    For example, muscle mass typically goes down with age, while fat increases. Losing muscle mass slows the rate at which the body uses calories. That rate is called metabolism. This process can make it more difficult to stay at a healthy weight. If you continue to eat as you always have and don't do more physical activity, you're likely to gain weight.     Most people become less active as they age. Paying attention to your activity and trying to move more may  help keep you at a healthy weight.     Genetic factors also might play a role in weight gain around menopause. If your parents or other close relatives carry extra weight around the abdomen, you probably will too.     Other factors, such as eating a diet that isn't healthy, increased stress and not getting enough sleep, might contribute to weight gain. Stress increases our Cortisol level which can negatively effect our metabolism. When people don't get enough sleep, they tend to snack more and eat and drink more calories. Hormones can be in normal ranges during our cycle as we age and even during the menopause transition until we stop having periods for 1 yr or more--despite the mental health and physical changes that are happening, therefore female hormone testing is usually not useful. Thyroid problems can cause weight changes but  there are usually other signs of thyroid disease like cycle changes, mood changes/extreme fatigue, temperature changes and sleep issues.     We advise:   - Increase your activity if you don't exercise and/or change your exercise to include more strength/resistance and weight training. This will help build muscle and increase your metabolism   - Consider joining a gym, getting a personal trainer or try a new activity/exercise, wearing a weighted vest (if safe for you) when walking or at work if you are able and/or while doing common household chores like cleaning, vacuuming, laundry, etc.   - Set a goal of at least 150 minutes of moderate to intense activity per week including strength/resistance and weight training at least 3 to 4 times/week     - Adequate intake of below nutrients/vitamins/minerals, ideally from whole food sources. These are some general principles to follow. For personalized nutrition support - I recommend [employer]ish (online registered dietician program)   - Fiber 30-40+ gm/daily   - Calcium 1200mg /divided daily with Vit D 800 IU/divided daily or Vit D3 2000 IU/daily   -  Omega 3 fatty acids at least 500 mg combination of DHA and EHA   - Protein-1.2-1.6 gm/kg of ideal body weight daily or aim for 100 gm/daily   - Probiotic (kefir, yogurt, kombucha, fermented foods like sauerkraut, kimchi, etc)   - Anti-inflammatory/anti-oxidant/flavenoids foods/supplements such as Turmeric, dark leafy greens (kale, collard greens, swiss chard, lettuce), fruits (berries, peaches, tomatoes), scallions/onions, broccoli, cabbage   - Healthy carbohydrates with fiber and low added sugars   - Consider supplements below:   - Creatine 2-5 g daily   - Collagen, types I and III most bioavailable   - Vitamin D3 2000 units daily   - Adjusting macro-nutrients and consider a Mediterranean diet hormonetracker.be mediterranean-diet-for-inflammation   - Avoid alcohol, caffeine and excessive sweets/added sugars (should limit added sugars to 20mg  daily)   - Ensure you sleep 8 hrs per night   - Reduce your stress   - Consider weight loss support groups/program (Weight Watchers, Profile by Marlee Paradise) or apps (MyFitnessPal, NOOM), [facility]     "

## 2024-05-18 ENCOUNTER — Encounter

## 2024-05-19 MED ORDER — SPIRONOLACTONE-HCTZ 25-25 MG PO TABS
25-25 | ORAL_TABLET | Freq: Every day | ORAL | 1 refills | Status: AC
Start: 2024-05-19 — End: ?

## 2024-05-19 NOTE — Telephone Encounter (Signed)
 Requested Prescriptions     Pending Prescriptions Disp Refills    spironolactone -hydroCHLOROthiazide (ALDACTAZIDE) 25-25 MG per tablet [Pharmacy Med Name: SPIRONOLACTONE -HCTZ 25-25 TAB] 90 tablet 1     Sig: TAKE 1 TABLET BY MOUTH EVERY DAY      Last Visit Date: 04/30/2024   Next Visit Date: 07/30/2024
